# Patient Record
Sex: Female | Born: 1935 | Race: White | Hispanic: No | Smoking: Never smoker
Health system: Southern US, Community
[De-identification: ages and names within clinical notes are randomized; demographics above are authoritative.]

## PROBLEM LIST (undated history)

## (undated) DIAGNOSIS — C55 Malignant neoplasm of uterus, part unspecified: Secondary | ICD-10-CM

## (undated) DIAGNOSIS — E785 Hyperlipidemia, unspecified: Secondary | ICD-10-CM

## (undated) DIAGNOSIS — M79604 Pain in right leg: Secondary | ICD-10-CM

## (undated) DIAGNOSIS — K219 Gastro-esophageal reflux disease without esophagitis: Secondary | ICD-10-CM

## (undated) DIAGNOSIS — Z9289 Personal history of other medical treatment: Secondary | ICD-10-CM

## (undated) DIAGNOSIS — S7291XA Unspecified fracture of right femur, initial encounter for closed fracture: Secondary | ICD-10-CM

## (undated) DIAGNOSIS — F0390 Unspecified dementia without behavioral disturbance: Secondary | ICD-10-CM

## (undated) DIAGNOSIS — I480 Paroxysmal atrial fibrillation: Secondary | ICD-10-CM

## (undated) DIAGNOSIS — I890 Lymphedema, not elsewhere classified: Secondary | ICD-10-CM

## (undated) DIAGNOSIS — I10 Essential (primary) hypertension: Secondary | ICD-10-CM

## (undated) DIAGNOSIS — M1990 Unspecified osteoarthritis, unspecified site: Secondary | ICD-10-CM

## (undated) DIAGNOSIS — R251 Tremor, unspecified: Secondary | ICD-10-CM

## (undated) DIAGNOSIS — K8020 Calculus of gallbladder without cholecystitis without obstruction: Secondary | ICD-10-CM

## (undated) DIAGNOSIS — I2510 Atherosclerotic heart disease of native coronary artery without angina pectoris: Secondary | ICD-10-CM

## (undated) HISTORY — DX: Unspecified fracture of right femur, initial encounter for closed fracture: S72.91XA

## (undated) HISTORY — PX: KNEE SURGERY: SHX244

## (undated) HISTORY — DX: Gastro-esophageal reflux disease without esophagitis: K21.9

## (undated) HISTORY — DX: Calculus of gallbladder without cholecystitis without obstruction: K80.20

## (undated) HISTORY — DX: Atherosclerotic heart disease of native coronary artery without angina pectoris: I25.10

## (undated) HISTORY — PX: CATARACT EXTRACTION: SUR2

## (undated) HISTORY — DX: Pain in right leg: M79.604

## (undated) HISTORY — DX: Unspecified osteoarthritis, unspecified site: M19.90

## (undated) HISTORY — DX: Hyperlipidemia, unspecified: E78.5

## (undated) HISTORY — DX: Malignant neoplasm of uterus, part unspecified: C55

## (undated) HISTORY — PX: TOTAL HIP ARTHROPLASTY: SHX124

## (undated) HISTORY — DX: Essential (primary) hypertension: I10

## (undated) HISTORY — DX: Tremor, unspecified: R25.1

---

## 1997-05-11 HISTORY — PX: CARDIAC CATHETERIZATION: SHX172

## 1997-05-11 HISTORY — PX: OTHER SURGICAL HISTORY: SHX169

## 1997-10-01 ENCOUNTER — Inpatient Hospital Stay (HOSPITAL_COMMUNITY): Admission: AD | Admit: 1997-10-01 | Discharge: 1997-10-10 | Payer: Self-pay | Admitting: Cardiology

## 1997-11-02 ENCOUNTER — Other Ambulatory Visit: Admission: RE | Admit: 1997-11-02 | Discharge: 1997-11-02 | Payer: Self-pay | Admitting: Cardiology

## 1998-06-27 ENCOUNTER — Other Ambulatory Visit: Admission: RE | Admit: 1998-06-27 | Discharge: 1998-06-27 | Payer: Self-pay | Admitting: Obstetrics & Gynecology

## 1999-06-20 ENCOUNTER — Other Ambulatory Visit: Admission: RE | Admit: 1999-06-20 | Discharge: 1999-06-20 | Payer: Self-pay | Admitting: Obstetrics and Gynecology

## 2000-05-11 HISTORY — PX: CARDIAC CATHETERIZATION: SHX172

## 2000-08-25 ENCOUNTER — Ambulatory Visit (HOSPITAL_COMMUNITY): Admission: AD | Admit: 2000-08-25 | Discharge: 2000-08-25 | Payer: Self-pay | Admitting: Cardiology

## 2003-07-10 ENCOUNTER — Encounter: Admission: RE | Admit: 2003-07-10 | Discharge: 2003-07-10 | Payer: Self-pay | Admitting: Orthopaedic Surgery

## 2003-07-19 ENCOUNTER — Encounter: Admission: RE | Admit: 2003-07-19 | Discharge: 2003-07-19 | Payer: Self-pay | Admitting: Orthopaedic Surgery

## 2003-08-06 ENCOUNTER — Encounter: Admission: RE | Admit: 2003-08-06 | Discharge: 2003-08-06 | Payer: Self-pay | Admitting: Orthopaedic Surgery

## 2003-09-18 ENCOUNTER — Inpatient Hospital Stay (HOSPITAL_COMMUNITY): Admission: RE | Admit: 2003-09-18 | Discharge: 2003-09-21 | Payer: Self-pay | Admitting: Orthopaedic Surgery

## 2003-09-21 ENCOUNTER — Inpatient Hospital Stay (HOSPITAL_COMMUNITY)
Admission: RE | Admit: 2003-09-21 | Discharge: 2003-09-22 | Payer: Self-pay | Admitting: Physical Medicine & Rehabilitation

## 2003-09-22 ENCOUNTER — Inpatient Hospital Stay (HOSPITAL_COMMUNITY): Admission: AD | Admit: 2003-09-22 | Discharge: 2003-09-28 | Payer: Self-pay | Admitting: *Deleted

## 2003-12-26 ENCOUNTER — Encounter: Admission: RE | Admit: 2003-12-26 | Discharge: 2004-01-22 | Payer: Self-pay | Admitting: Orthopaedic Surgery

## 2004-04-09 ENCOUNTER — Ambulatory Visit (HOSPITAL_COMMUNITY): Admission: RE | Admit: 2004-04-09 | Discharge: 2004-04-09 | Payer: Self-pay | Admitting: Orthopaedic Surgery

## 2004-06-28 IMAGING — CR DG CHEST 2V
2 series · 2 of 2 positions shown · non-contrast
Comparison: none

CLINICAL DATA: Right hip osteoarthritis.  Hypertension.  Coronary artery disease.  Pre-op respiratory exam for joint replacement surgery. 
 TWO VIEW CHEST 
 There are no prior studies for comparison. 
 Mild cardiomegaly is noted.  Both lungs are clear. There is no evidence of pleural effusion.  There is no evidence of mass or adenopathy. 
 IMPRESSION
 Mild cardiomegaly.  No active disease

[view not recorded (1 of 2)]
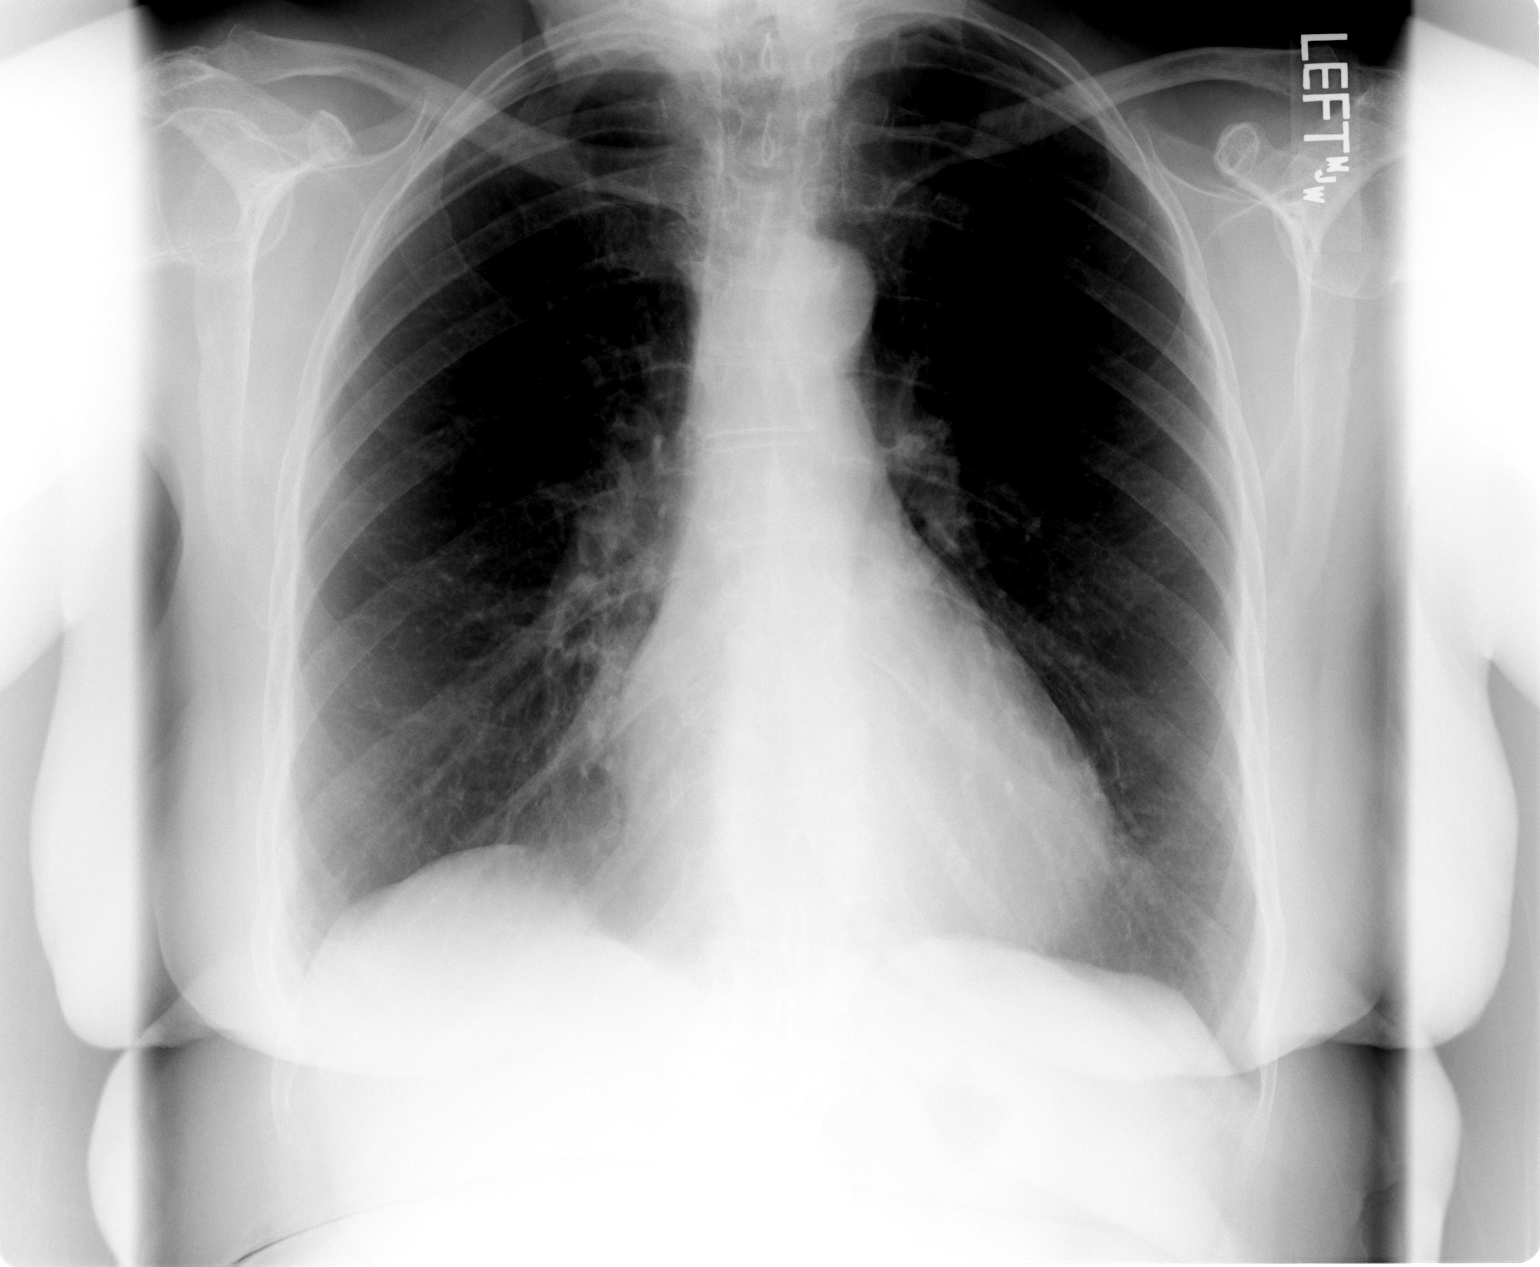

[view not recorded (2 of 2)]
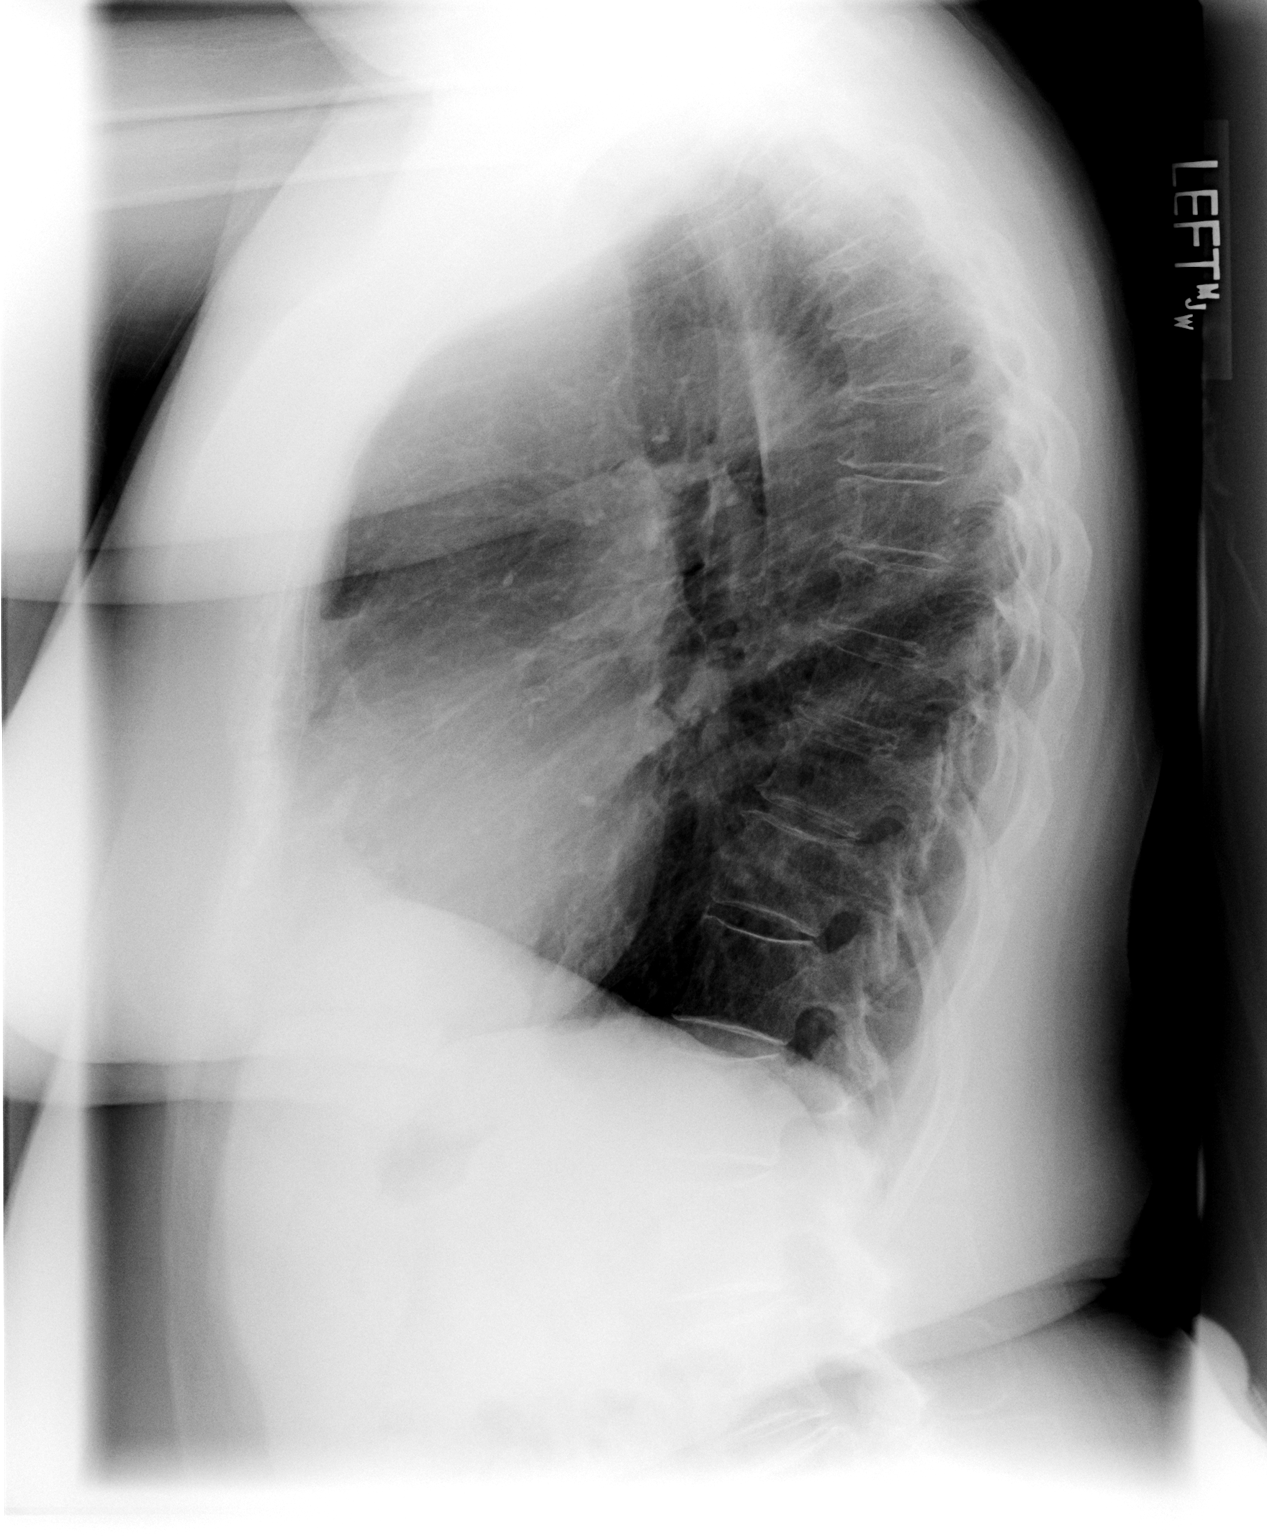

[2 of 2 positions shown; findings below may reference images not displayed]

## 2005-05-11 HISTORY — PX: CARDIAC CATHETERIZATION: SHX172

## 2005-09-24 ENCOUNTER — Encounter: Admission: RE | Admit: 2005-09-24 | Discharge: 2005-09-24 | Payer: Self-pay | Admitting: Family Medicine

## 2005-10-16 ENCOUNTER — Ambulatory Visit (HOSPITAL_COMMUNITY): Admission: RE | Admit: 2005-10-16 | Discharge: 2005-10-17 | Payer: Self-pay | Admitting: Cardiology

## 2005-10-26 ENCOUNTER — Encounter: Admission: RE | Admit: 2005-10-26 | Discharge: 2005-10-26 | Payer: Self-pay | Admitting: Cardiology

## 2006-05-27 ENCOUNTER — Emergency Department (HOSPITAL_COMMUNITY): Admission: EM | Admit: 2006-05-27 | Discharge: 2006-05-27 | Payer: Self-pay | Admitting: Emergency Medicine

## 2007-05-12 HISTORY — PX: OTHER SURGICAL HISTORY: SHX169

## 2007-11-18 ENCOUNTER — Encounter: Admission: RE | Admit: 2007-11-18 | Discharge: 2007-11-18 | Payer: Self-pay | Admitting: Family Medicine

## 2008-04-30 ENCOUNTER — Inpatient Hospital Stay (HOSPITAL_COMMUNITY): Admission: RE | Admit: 2008-04-30 | Discharge: 2008-05-07 | Payer: Self-pay | Admitting: Orthopedic Surgery

## 2008-05-23 ENCOUNTER — Encounter: Payer: Self-pay | Admitting: Family Medicine

## 2008-07-11 ENCOUNTER — Inpatient Hospital Stay (HOSPITAL_COMMUNITY): Admission: EM | Admit: 2008-07-11 | Discharge: 2008-07-19 | Payer: Self-pay

## 2008-07-11 ENCOUNTER — Encounter: Payer: Self-pay | Admitting: Emergency Medicine

## 2008-07-13 ENCOUNTER — Ambulatory Visit: Payer: Self-pay | Admitting: Vascular Surgery

## 2008-07-13 ENCOUNTER — Encounter (INDEPENDENT_AMBULATORY_CARE_PROVIDER_SITE_OTHER): Payer: Self-pay | Admitting: *Deleted

## 2008-09-07 ENCOUNTER — Ambulatory Visit: Payer: Self-pay | Admitting: Vascular Surgery

## 2008-11-04 ENCOUNTER — Inpatient Hospital Stay (HOSPITAL_COMMUNITY): Admission: EM | Admit: 2008-11-04 | Discharge: 2008-11-13 | Payer: Self-pay | Admitting: Emergency Medicine

## 2008-11-12 ENCOUNTER — Ambulatory Visit: Payer: Self-pay | Admitting: Physical Medicine & Rehabilitation

## 2008-12-19 ENCOUNTER — Encounter: Admission: RE | Admit: 2008-12-19 | Discharge: 2008-12-19 | Payer: Self-pay | Admitting: Orthopedic Surgery

## 2008-12-20 ENCOUNTER — Ambulatory Visit: Payer: Self-pay | Admitting: Family Medicine

## 2008-12-20 DIAGNOSIS — I2510 Atherosclerotic heart disease of native coronary artery without angina pectoris: Secondary | ICD-10-CM

## 2008-12-20 DIAGNOSIS — Z9861 Coronary angioplasty status: Secondary | ICD-10-CM

## 2008-12-20 DIAGNOSIS — L89309 Pressure ulcer of unspecified buttock, unspecified stage: Secondary | ICD-10-CM | POA: Insufficient documentation

## 2008-12-20 DIAGNOSIS — K219 Gastro-esophageal reflux disease without esophagitis: Secondary | ICD-10-CM

## 2008-12-20 DIAGNOSIS — R05 Cough: Secondary | ICD-10-CM | POA: Insufficient documentation

## 2008-12-20 DIAGNOSIS — J45909 Unspecified asthma, uncomplicated: Secondary | ICD-10-CM | POA: Insufficient documentation

## 2008-12-26 ENCOUNTER — Telehealth: Payer: Self-pay | Admitting: Family Medicine

## 2009-01-23 ENCOUNTER — Telehealth: Payer: Self-pay | Admitting: Family Medicine

## 2009-01-31 ENCOUNTER — Ambulatory Visit: Payer: Self-pay | Admitting: Family Medicine

## 2009-05-09 ENCOUNTER — Encounter: Payer: Self-pay | Admitting: *Deleted

## 2009-05-27 ENCOUNTER — Encounter: Payer: Self-pay | Admitting: Family Medicine

## 2009-06-18 ENCOUNTER — Ambulatory Visit: Payer: Self-pay | Admitting: Family Medicine

## 2009-06-18 DIAGNOSIS — L738 Other specified follicular disorders: Secondary | ICD-10-CM | POA: Insufficient documentation

## 2009-12-24 ENCOUNTER — Ambulatory Visit: Payer: Self-pay | Admitting: Cardiology

## 2010-04-09 ENCOUNTER — Ambulatory Visit: Payer: Self-pay | Admitting: Cardiology

## 2010-06-01 ENCOUNTER — Encounter: Payer: Self-pay | Admitting: Orthopaedic Surgery

## 2010-06-12 NOTE — Assessment & Plan Note (Signed)
Summary: RASH ON LEG/NJR   Vital Signs:  Patient profile:   77 year old female Menstrual status:  postmenopausal Temp:     98.7 degrees F BP sitting:   150 / 90  (left arm) Cuff size:   regular  Vitals Entered By: Sid Falcon LPN (June 18, 2009 11:53 AM) CC: Rash on left lower leg several months   History of Present Illness: Pruritic rash left anterior leg. Present for several weeks. No injury. No drainage. No associated warmth. No edema. Has tried topical lotions without improvement. No right leg involvement. No other skin rash  Allergies: 1)  Ferrous Sulfate (Ferrous Sulfate) 2)  Erythromycin Stearate (Erythromycin Stearate)  Past History:  Past Medical History: Last updated: 12/20/2008 Asthma GERD Fx right leg femur Arthritis Chicken pox heart arrhythmia Coronary artery disease PMH reviewed for relevance  Review of Systems      See HPI  Physical Exam  General:  Well-developed,well-nourished,in no acute distress; alert,appropriate and cooperative throughout examination Lungs:  Normal respiratory effort, chest expands symmetrically. Lungs are clear to auscultation, no crackles or wheezes. Heart:  normal rate and regular rhythm.   Skin:  left anterior leg reveals area proximally 3 x 8 cm erythematous rash slightly scaly generally atrophic skin   Impression & Recommendations:  Problem # 1:  ASTEATOTIC ECZEMA (ICD-706.8) try group 3 or 4 topical steroid  Complete Medication List: 1)  Multaq 400 Mg Tabs (Dronedarone hcl) .... Two times a day 2)  Metoprolol Tartrate 50 Mg Tabs (Metoprolol tartrate) .... Two times a day 3)  Simvastatin 20 Mg Tabs (Simvastatin) .... Once daily 4)  Aspirin 325 Mg Tabs (Aspirin) .... Once daily 5)  Isosorbide Mononitrate Cr 60 Mg Xr24h-tab (Isosorbide mononitrate) .... Once daily 6)  Zithromax Z-pak 250 Mg Tabs (Azithromycin) .... As directed 7)  Triamcinolone Acetonide 0.1 % Crea (Triamcinolone acetonide) .... Apply to  affected rash two times a day  Patient Instructions: 1)  avoid drying agents such as alcohol or peroxide 2)  Use medicated cream twice daily and be in touch 2-3 weeks if no improvement 3)  Follow up immediately if you see signs of secondary infection such as increased warmth, redness, or tenderness Prescriptions: TRIAMCINOLONE ACETONIDE 0.1 % CREA (TRIAMCINOLONE ACETONIDE) apply to affected rash two times a day  #30 gm x 1   Entered and Authorized by:   Evelena Peat MD   Signed by:   Evelena Peat MD on 06/18/2009   Method used:   Electronically to        [facility] [phone]* (retail)       [address]       [place], [place]  [address]       Ph: [phone] or [phone]       Fax: [phone]   RxID:   [id number]

## 2010-06-12 NOTE — Letter (Signed)
Summary: [facility]  [facility]   Imported By: Maryln Gottron 06/07/2009 10:01:54  _____________________________________________________________________  External Attachment:    Type:   Image     Comment:   External Document

## 2010-06-27 ENCOUNTER — Ambulatory Visit (INDEPENDENT_AMBULATORY_CARE_PROVIDER_SITE_OTHER): Payer: Medicare Other | Admitting: *Deleted

## 2010-06-27 DIAGNOSIS — G250 Essential tremor: Secondary | ICD-10-CM

## 2010-06-27 DIAGNOSIS — I4891 Unspecified atrial fibrillation: Secondary | ICD-10-CM

## 2010-06-27 DIAGNOSIS — I2510 Atherosclerotic heart disease of native coronary artery without angina pectoris: Secondary | ICD-10-CM

## 2010-07-15 ENCOUNTER — Other Ambulatory Visit: Payer: Self-pay | Admitting: Family Medicine

## 2010-07-15 DIAGNOSIS — R21 Rash and other nonspecific skin eruption: Secondary | ICD-10-CM

## 2010-08-17 LAB — PROTIME-INR
INR: 1 (ref 0.00–1.49)
INR: 1.1 (ref 0.00–1.49)
INR: 1.2 (ref 0.00–1.49)
INR: 1.2 (ref 0.00–1.49)
Prothrombin Time: 13.9 seconds (ref 11.6–15.2)
Prothrombin Time: 14.3 seconds (ref 11.6–15.2)
Prothrombin Time: 15 seconds (ref 11.6–15.2)
Prothrombin Time: 16 seconds — ABNORMAL HIGH (ref 11.6–15.2)

## 2010-08-17 LAB — BASIC METABOLIC PANEL
BUN: 11 mg/dL (ref 6–23)
BUN: 12 mg/dL (ref 6–23)
BUN: 14 mg/dL (ref 6–23)
BUN: 15 mg/dL (ref 6–23)
CO2: 26 mEq/L (ref 19–32)
Calcium: 7.8 mg/dL — ABNORMAL LOW (ref 8.4–10.5)
Calcium: 8 mg/dL — ABNORMAL LOW (ref 8.4–10.5)
Chloride: 105 mEq/L (ref 96–112)
Chloride: 110 mEq/L (ref 96–112)
Creatinine, Ser: 1.03 mg/dL (ref 0.4–1.2)
Creatinine, Ser: 1.38 mg/dL — ABNORMAL HIGH (ref 0.4–1.2)
Creatinine, Ser: 1.5 mg/dL — ABNORMAL HIGH (ref 0.4–1.2)
GFR calc Af Amer: >60 mL/min (ref >60)
GFR calc non Af Amer: 33 mL/min — ABNORMAL LOW (ref >60)
GFR calc non Af Amer: 52 mL/min — ABNORMAL LOW (ref >60)
Glucose, Bld: 154 mg/dL — ABNORMAL HIGH (ref 70–99)
Glucose, Bld: 194 mg/dL — ABNORMAL HIGH (ref 70–99)
Glucose, Bld: 97 mg/dL (ref 70–99)
Potassium: 3.7 mEq/L (ref 3.5–5.1)
Potassium: 4.1 mEq/L (ref 3.5–5.1)
Sodium: 138 mEq/L (ref 135–145)

## 2010-08-17 LAB — CBC
HCT: 26.7 % — ABNORMAL LOW (ref 36.0–46.0)
HCT: 30.3 % — ABNORMAL LOW (ref 36.0–46.0)
Hemoglobin: 9.2 g/dL — ABNORMAL LOW (ref 12.0–15.0)
MCHC: 33.7 g/dL (ref 30.0–36.0)
MCV: 88.4 fL (ref 78.0–100.0)
MCV: 90.6 fL (ref 78.0–100.0)
Platelets: 157 10*3/uL (ref 150–400)
RBC: 2.78 MIL/uL — ABNORMAL LOW (ref 3.87–5.11)
RBC: 3.03 MIL/uL — ABNORMAL LOW (ref 3.87–5.11)
RDW: 15.4 % (ref 11.5–15.5)
WBC: 11.2 10*3/uL — ABNORMAL HIGH (ref 4.0–10.5)
WBC: 6.5 10*3/uL (ref 4.0–10.5)
WBC: 6.6 10*3/uL (ref 4.0–10.5)

## 2010-08-17 LAB — CROSSMATCH
ABO/RH(D): A NEG
Antibody Screen: POSITIVE
Donor AG Type: NEGATIVE

## 2010-08-18 LAB — DIFFERENTIAL
Basophils Absolute: 0.1 10*3/uL (ref 0.0–0.1)
Eosinophils Relative: 2 % (ref 0–5)
Lymphs Abs: 1.3 10*3/uL (ref 0.7–4.0)
Neutro Abs: 4.6 10*3/uL (ref 1.7–7.7)

## 2010-08-18 LAB — POCT I-STAT 4, (NA,K, GLUC, HGB,HCT)
Glucose, Bld: 120 mg/dL — ABNORMAL HIGH (ref 70–99)
HCT: 35 % — ABNORMAL LOW (ref 36.0–46.0)
Sodium: 139 mEq/L (ref 135–145)

## 2010-08-18 LAB — CROSSMATCH: Donor AG Type: NEGATIVE

## 2010-08-18 LAB — BASIC METABOLIC PANEL
BUN: 14 mg/dL (ref 6–23)
CO2: 26 mEq/L (ref 19–32)
Calcium: 8.1 mg/dL — ABNORMAL LOW (ref 8.4–10.5)
Creatinine, Ser: 0.9 mg/dL (ref 0.4–1.2)
Glucose, Bld: 79 mg/dL (ref 70–99)

## 2010-08-18 LAB — CBC
Hemoglobin: 13.6 g/dL (ref 12.0–15.0)
MCHC: 33.3 g/dL (ref 30.0–36.0)
MCV: 90.5 fL (ref 78.0–100.0)
RBC: 4.5 MIL/uL (ref 3.87–5.11)
WBC: 6.6 10*3/uL (ref 4.0–10.5)

## 2010-08-18 LAB — URINALYSIS, ROUTINE W REFLEX MICROSCOPIC
Bilirubin Urine: NEGATIVE
Nitrite: POSITIVE — AB
Urobilinogen, UA: 0.2 mg/dL (ref 0.0–1.0)

## 2010-08-18 LAB — URINALYSIS, MICROSCOPIC ONLY
Glucose, UA: NEGATIVE mg/dL
Ketones, ur: NEGATIVE mg/dL
Protein, ur: NEGATIVE mg/dL

## 2010-08-18 LAB — URINE CULTURE: Colony Count: >=100000

## 2010-08-18 LAB — PROTIME-INR
INR: 1 (ref 0.00–1.49)
Prothrombin Time: 13.3 seconds (ref 11.6–15.2)

## 2010-08-21 LAB — CBC
HCT: 27.9 % — ABNORMAL LOW (ref 36.0–46.0)
HCT: 29.7 % — ABNORMAL LOW (ref 36.0–46.0)
HCT: 31.5 % — ABNORMAL LOW (ref 36.0–46.0)
Hemoglobin: 10.1 g/dL — ABNORMAL LOW (ref 12.0–15.0)
Hemoglobin: 9.6 g/dL — ABNORMAL LOW (ref 12.0–15.0)
Hemoglobin: 11.8 g/dL — ABNORMAL LOW (ref 12.0–15.0)
MCHC: 34.5 g/dL (ref 30.0–36.0)
MCHC: 35 g/dL (ref 30.0–36.0)
MCHC: 35.1 g/dL (ref 30.0–36.0)
MCHC: 35.2 g/dL (ref 30.0–36.0)
MCHC: 35.4 g/dL (ref 30.0–36.0)
MCHC: 36 g/dL (ref 30.0–36.0)
MCHC: 32.9 g/dL (ref 30.0–36.0)
MCV: 88.8 fL (ref 78.0–100.0)
MCV: 90.5 fL (ref 78.0–100.0)
MCV: 90.8 fL (ref 78.0–100.0)
MCV: 93.6 fL (ref 78.0–100.0)
MCV: 93 fL (ref 78.0–100.0)
Platelets: 100 10*3/uL — ABNORMAL LOW (ref 150–400)
Platelets: 136 10*3/uL — ABNORMAL LOW (ref 150–400)
RBC: 2.64 MIL/uL — ABNORMAL LOW (ref 3.87–5.11)
RBC: 2.99 MIL/uL — ABNORMAL LOW (ref 3.87–5.11)
RBC: 3.23 MIL/uL — ABNORMAL LOW (ref 3.87–5.11)
RBC: 3.48 MIL/uL — ABNORMAL LOW (ref 3.87–5.11)
RBC: 3.84 MIL/uL — ABNORMAL LOW (ref 3.87–5.11)
RDW: 14.7 % (ref 11.5–15.5)
WBC: 6.9 10*3/uL (ref 4.0–10.5)
WBC: 7 10*3/uL (ref 4.0–10.5)
WBC: 7.2 10*3/uL (ref 4.0–10.5)
WBC: 8.9 10*3/uL (ref 4.0–10.5)

## 2010-08-21 LAB — CROSSMATCH
ABO/RH(D): A NEG
DAT, IgG: NEGATIVE
Donor AG Type: NEGATIVE
Donor AG Type: NEGATIVE
Donor AG Type: NEGATIVE
Donor AG Type: NEGATIVE

## 2010-08-21 LAB — BASIC METABOLIC PANEL
BUN: 10 mg/dL (ref 6–23)
BUN: 17 mg/dL (ref 6–23)
BUN: 17 mg/dL (ref 6–23)
CO2: 21 mEq/L (ref 19–32)
CO2: 23 mEq/L (ref 19–32)
CO2: 24 mEq/L (ref 19–32)
CO2: 24 mEq/L (ref 19–32)
CO2: 27 mEq/L (ref 19–32)
CO2: 27 mEq/L (ref 19–32)
Calcium: 7.1 mg/dL — ABNORMAL LOW (ref 8.4–10.5)
Calcium: 7.4 mg/dL — ABNORMAL LOW (ref 8.4–10.5)
Calcium: 7.5 mg/dL — ABNORMAL LOW (ref 8.4–10.5)
Chloride: 108 mEq/L (ref 96–112)
Chloride: 108 mEq/L (ref 96–112)
Chloride: 108 mEq/L (ref 96–112)
Chloride: 109 mEq/L (ref 96–112)
Creatinine, Ser: 0.8 mg/dL (ref 0.4–1.2)
Creatinine, Ser: 0.99 mg/dL (ref 0.4–1.2)
Creatinine, Ser: 1.03 mg/dL (ref 0.4–1.2)
Creatinine, Ser: 1.06 mg/dL (ref 0.4–1.2)
Creatinine, Ser: 1.11 mg/dL (ref 0.4–1.2)
GFR calc Af Amer: >60 mL/min (ref >60)
GFR calc Af Amer: >60 mL/min (ref >60)
GFR calc Af Amer: >60 mL/min (ref >60)
GFR calc Af Amer: >60 mL/min (ref >60)
GFR calc non Af Amer: >60 mL/min (ref >60)
Glucose, Bld: 134 mg/dL — ABNORMAL HIGH (ref 70–99)
Glucose, Bld: 91 mg/dL (ref 70–99)
Potassium: 3.5 mEq/L (ref 3.5–5.1)
Potassium: 4.6 mEq/L (ref 3.5–5.1)
Sodium: 138 mEq/L (ref 135–145)
Sodium: 140 mEq/L (ref 135–145)

## 2010-08-21 LAB — URINE CULTURE
Colony Count: NO GROWTH
Culture: NO GROWTH

## 2010-08-21 LAB — PHOSPHORUS: Phosphorus: 6 mg/dL — ABNORMAL HIGH (ref 2.3–4.6)

## 2010-08-21 LAB — DIFFERENTIAL
Basophils Relative: 1 % (ref 0–1)
Eosinophils Absolute: 0.2 10*3/uL (ref 0.0–0.7)
Lymphs Abs: 1.4 10*3/uL (ref 0.7–4.0)
Monocytes Absolute: 0.7 10*3/uL (ref 0.1–1.0)
Monocytes Relative: 8 % (ref 3–12)
Neutro Abs: 6.6 10*3/uL (ref 1.7–7.7)

## 2010-08-21 LAB — POCT I-STAT, CHEM 8
Chloride: 107 mEq/L (ref 96–112)
Glucose, Bld: 142 mg/dL — ABNORMAL HIGH (ref 70–99)
HCT: 36 % (ref 36.0–46.0)
Hemoglobin: 12.2 g/dL (ref 12.0–15.0)
Potassium: 4.1 mEq/L (ref 3.5–5.1)

## 2010-08-21 LAB — POCT CARDIAC MARKERS
CKMB, poc: <1 ng/mL — ABNORMAL LOW (ref 1.0–8.0)
Troponin i, poc: <0.05 ng/mL (ref 0.00–0.09)

## 2010-08-21 LAB — URINALYSIS, ROUTINE W REFLEX MICROSCOPIC
Glucose, UA: NEGATIVE mg/dL
Ketones, ur: NEGATIVE mg/dL
Protein, ur: 30 mg/dL — AB
Urobilinogen, UA: 0.2 mg/dL (ref 0.0–1.0)

## 2010-08-21 LAB — CARDIAC PANEL(CRET KIN+CKTOT+MB+TROPI)
CK, MB: 1.2 ng/mL (ref 0.3–4.0)
Relative Index: 1 (ref 0.0–2.5)
Troponin I: <0.01 ng/mL (ref 0.00–0.06)
Troponin I: <0.01 ng/mL (ref 0.00–0.06)

## 2010-08-21 LAB — POCT I-STAT 4, (NA,K, GLUC, HGB,HCT)
Potassium: 4.8 mEq/L (ref 3.5–5.1)
Sodium: 139 mEq/L (ref 135–145)

## 2010-08-21 LAB — COMPREHENSIVE METABOLIC PANEL
ALT: 10 U/L (ref 0–35)
AST: 17 U/L (ref 0–37)
Albumin: 2.7 g/dL — ABNORMAL LOW (ref 3.5–5.2)
Alkaline Phosphatase: 56 U/L (ref 39–117)
BUN: 25 mg/dL — ABNORMAL HIGH (ref 6–23)
Chloride: 106 mEq/L (ref 96–112)
Potassium: 4.4 mEq/L (ref 3.5–5.1)
Total Bilirubin: 0.6 mg/dL (ref 0.3–1.2)

## 2010-08-21 LAB — CK TOTAL AND CKMB (NOT AT ARMC): CK, MB: 1.3 ng/mL (ref 0.3–4.0)

## 2010-08-21 LAB — TROPONIN I: Troponin I: 0.01 ng/mL (ref 0.00–0.06)

## 2010-08-21 LAB — URINE MICROSCOPIC-ADD ON

## 2010-08-21 LAB — APTT: aPTT: 28 seconds (ref 24–37)

## 2010-09-23 NOTE — Op Note (Signed)
NAME:  Dominique Perez, Dominique Perez                 ACCOUNT NO.:  [id number]   MEDICAL RECORD NO.:  [id number]          PATIENT TYPE:  INP   LOCATION:  1611                         FACILITY:  [facility]   PHYSICIAN:  Ollen Gross, M.D.    DATE OF BIRTH:  Sep 12, 1926   DATE OF PROCEDURE:  11/07/2008  DATE OF DISCHARGE:                               OPERATIVE REPORT   PREOPERATIVE DIAGNOSIS:  Right periprosthetic femur fracture with  nonunion.   POSTOPERATIVE DIAGNOSIS:  Right periprosthetic femur fracture with  nonunion.   PROCEDURE:  Open reduction internal fixation, right periprosthetic femur  fracture with hardware removal and subsequent placement of  intramedullary nail and femoral strut grafting.   SURGEON:  Ollen Gross, M.D.   ASSISTANT:  Dominique Peace PA-C   ANESTHESIA:  General.   ESTIMATED BLOOD LOSS:  700 mL.   DRAINS:  Hemovac x2.   COMPLICATIONS:  None.   CONDITION:  Stable to recovery.   CLINICAL NOTE:  Dominique Perez is a 77 year old female who had a right  periprosthetic femur fracture fixed by Dr. Charlann Boxer back in March.  She  fractured between the total knee and the total hip.  They used a locking  plate.  She did well initially and this past week was walking and felt a  snap.  X-rays in the hospital showed that the plate was broken and that  she refractured.  She has been cleared medically and presents now for  open reduction internal fixation.   PROCEDURE IN DETAIL:  After successful administration of general  anesthetic, the patient was placed in supine position on the operating  table.  Right lower extremity is isolated from her perineum with plastic  drapes and prepped and draped in the usual sterile fashion.  I flexed  her hip and bumped her hip up to slightly internally rotate the femur.  I used a previous lateral incision and cut the skin with a 10 blade  through subcutaneous tissue to the fascia lata which was incised in line  with the skin incision.  The scar from the  fascia and the vastus  lateralis was incised and then I incised through scar to get down to the  plate.  The plate was broken at the fracture site.  This was a Synthes  locking plate.  All the screws were removed from the plate.  There were  3 cables and those were removed also.  All the hardware was taken out of  the femur.  There was gross comminution at the fracture site.  I pulled  the femur out to length and held it in reduction clamps.  There was some  bone loss cortical anteriorly.  I was satisfied with the restoration of  length and with the alignment.  We then opened the knee with a midline  incision.  Skin was cut with a fresh 10 blade through subcutaneous  tissue and a fresh blade is used make a medial arthrotomy.  Patella was  subluxed laterally and the intercondylar notch is visualized.  This was  fortunately a open box total knee arthroplasty.  Thus  we were able to  pass a retrograde nail.  The drill was used to place the guide pin  through the intercondylar notch up into the femoral canal and visualized  the pin go into the proximal fragment.  We removed this, then placed a  measurement guide and 20 cm is the best length for this retrograde nail.  It is a 10 mm diameter.  The nail was hooked up to the alignment guide  and then I did my proximal drilling over the guide pin.  The guide pins  removed and the nail was placed through the intercondylar notch of the  femur up across the fracture site and into the proximal portion.  It  stopped about 4 cm below the tip of the femoral stem.  We placed 3  screws through the external guide distally into the femoral condyles,  each with excellent purchase.  I then put the femur back out to the  length, reduced the fracture and placed the lateral to medial interlock  through the external guide of the rod and then shifted the guide  anteriorly and put it anterior to posterior interlock each with  excellent purchase.  Fluoroscopic views,  AP and lateral showed excellent  reduction of this fracture and also the hardware was in good position.   Some of the loose fragments of bone were morselized and placed into the  fracture site.  The large cortical piece set back in its appropriate  position.  I then took a femoral strut graft, cut it down to size and  formulated the shape so that would fit on the lateral cortex of the  femur.  Four titanium Zimmer cables passed around the femur.  We then  incorporated the strut graft with the cables and sequentially tightened  all the cables using the Zimmer system.  Once the cables were adequately  tight, the clamps are tightened and the cable was cut.  This looked a  very stable reduction and again restoration of length and alignment of  this comminuted fracture.  I did not place any of the bone graft into  the fracture site as I did not want to place allograft into this bed  that has been operated on several times for fear of infection.  We  thoroughly irrigated both the knee wound and the lateral femur wound  with liters of saline solution.  The fascia lata and fascia of the  vastus lateralis were both closed with running #1 Vicryl over a Hemovac  drain.  Subcu was closed with #1 and interrupted 2-0 Vicryl.  The  lateral incision in the skin was closed with staples.  A Hemovac was  also placed in the knee and the arthrotomy closed with interrupted #1  PDS.  Subcu closed with interrupted 2-0 Vicryl, skin with staples.  Prior to closing, we did take multiple fluoroscopic views which showed  excellent alignment of fracture and position of hardware.  At the  conclusion the Hemovacs were hooked up to suction and bulky sterile  dressings were applied.  She is placed into a knee immobilizer, awakened  and transferred to recovery in stable condition.      Ollen Gross, M.D.  Electronically Signed     FA/MEDQ  D:  11/07/2008  T:  11/08/2008  Job:  [id number]

## 2010-09-23 NOTE — H&P (Signed)
NAME:  Dominique Perez, Dominique Perez NO.:  [id number]   MEDICAL RECORD NO.:  [id number]          PATIENT TYPE:  EMS   LOCATION:  ED                           [facility]AC[facility]  [facility]   PHYSICIAN:  Lucita Ferrara, MD         DATE OF BIRTH:  Feb 21, 1927   DATE OF ADMISSION:  07/11/2008  DATE OF DISCHARGE:                              HISTORY & PHYSICAL   ADDENDUM:  It was noted on her EKG that patient is relatively  bradycardic with a heart rate of 54.  Patient's heart rate usually runs  in the 70's.  It was also determined that patient has recently started a  medication called Multaq, which I believe is a medication that is  similar to amiodarone.  Patient has stopped her amiodarone.  In this  regard, cardiology should be definitely consulted.  We will go ahead and  continue her on Multaq.  We will put parameters on her metoprolol and  her Diltiazem and split them into 4 doses per day and 2 doses per day  respectively.  A 2D echocardiogram, cardiac enzymes x3 q.8h.  Repeat  EKG.  Watch her heart rate.  Watch her blood pressure.  The rest of the  plans are dependent upon her progress.      Lucita Ferrara, MD  Electronically Signed     RR/MEDQ  D:  07/11/2008  T:  07/11/2008  Job:  [id number]

## 2010-09-23 NOTE — Consult Note (Signed)
NAME:  Dominique, Perez                 ACCOUNT NO.:  [id number]   MEDICAL RECORD NO.:  [id number]          PATIENT TYPE:  INP   LOCATION:  3740                         FACILITY:  [facility]   PHYSICIAN:  Colleen Can. Deborah Chalk, M.D.DATE OF BIRTH:  Aug 30, 1926   DATE OF CONSULTATION:  07/12/2008  DATE OF DISCHARGE:                                 CONSULTATION   HISTORY OF PRESENT ILLNESS:  We have been asked by the [facility]  Hospitalist and orthopedic service to see [name].  She is a 77-year-  old female who was cleaning snow off her windshield of the car.  She  turned and was unsteady.  She is not sure whether or not it was related  to dizziness, but she was very clear on the fact that she did not lose  consciousness.  She landed on her right thigh and immediately knew she  had injured her leg.  She did not lose consciousness throughout all  this.  She had been on amiodarone because of history of paroxysmal  atrial fibrillation.  She developed a tremor.  Because of that tremor,  amiodarone was stopped 6 days ago.  Four days ago, she was started on  Multaq 400 mg twice a day.  She has tolerated that without arrhythmia  and has had followup EKGs without QT prolongation.  She has not been  having any other symptoms from a cardiac standpoint.  She has not had  any chest pain or lightheadedness or dizziness.  She has had no  palpitations.  The tremor that she was having has been essentially  unchanged over the past week.  In the office on July 10, 2008, she had a  sinus bradycardia with a rate of 51 with a QT of 490 milliseconds and  corrected QT of 451 milliseconds. We are asked to provide cardiac  clearance.   She has known ischemic heart disease.  She had stents to her diagonal  vessel in [place] 2007 and a stent to the right coronary artery in May  1999.  She had her last stress Cardiolite study in March 2009, which  shows an ejection fraction of 76% with no evidence of ischemia.   When she was  hospitalized with a left total knee replacement in December  2009, she had a paroxysmal atrial fibrillation.   ALLERGIES:  ERYTHROMYCIN and SULFA.   OTHER MEDICAL PROBLEMS:  1. History of PAF.  2. Hypertension.  3. Hyperlipidemia.  4. Poor condition.  5. Chronic low back pain.  6. Previous femoral pseudoaneurysm in May 1999.   CURRENT MEDICATIONS:  1. Metoprolol 25 mg b.i.d.  2. Imdur 60 mg b.i.d.  3. Diltiazem CD 120 mg per day.  4. Simvastatin 20 mg per day.  5. Prevacid 30 mg daily.  6. Initially Plavix 75 mg per day.  7. Aspirin 325 mg a day.  8. Multaq 400 mg b.i.d.  9. Multivitamin.   FAMILY HISTORY:  Father died with lung cancer and mother died with  myocardial infarction.   SOCIAL HISTORY:  She is widowed.  She has 4 daughters.  No  tobacco and  no alcohol.  She lives alone. Recently completed rehab following knee  replacement.   REVIEW OF SYSTEMS:  She has some history of dysuria.  She has done  reasonably well with recovery from her left total knee in December 2009.  She has had a significant tremor and is contributed to the amiodarone  and led to discontinuation of that.  She has osteoarthritis.  She had  significant nausea, vomiting, and diarrhea and this was felt to be  secondary amiodarone which has improved.  She recently had a metoprolol  dose cut back because of bradycardia.   And all other review of systems are negative.   PHYSICAL EXAMINATION:  GENERAL:  She is an elderly white female. She is  pleasant and able to follow commands.  VITAL SIGNS:  Blood pressure is 140/80 and heart rate 60. Afebrile.  SKIN: Warm and dry. COLOR: Normal.  HEENT:  Normocephalic and atraumatic.  Her pupils were equal, round, and  reactive to light.  Sclerae are nonicteric.  Oropharynx is moist.  NECK:  Supple.  No lymphadenopathy. No JVD.  HEART:  She has a regular rate and rhythm. No murmur.  LUNGS:  Clear.  There is no jugular venous distention.  ABDOMEN:  Soft and  nontender. + bowel sounds.  EXTREMITIES:  The left knee is not swollen and has healed surgical  wounds.  The right leg is in a brace. Distal pulses are intact. She does  have resting tremor of the hands.  M/S:  ROM intact except for R leg. Gait not able to be assessed.  NEURO:  Intact with no gross focal deficits.   IMPRESSION:  1. A fall leading to fracture of the right femur.  2. History of paroxysmal atrial fibrillation with recent initiation of      Multaq after discontinuation of amiodarone secondary to tremor,      nausea and vomiting.  3. History of coronary artery disease with stents in 2007 and also a      stent in the right coronary artery in 1999.  4. Osteoarthritis.  5. HTN.  6. Hyperlipidemia.   PLAN:  I think it is okay for her to undergo surgical repair of her  right femur.  I would like to watch her on telemetry both before and  after her surgery to make sure that we do not have any arrhythmia that  could be attributed to her fall, although she is clear in her history  that she did not have loss of consciousness.  She has not had any  recent angina or exacerbation of her ischemic heart disease and I think  she would be at low risk for surgical repair.  She does have a normal  ejection fraction with study performed in March 2009. We will follow  along with you. Thank you for the opportunity to see [name].      Colleen Can. Deborah Chalk, M.D.  Electronically Signed     SNT/MEDQ  D:  07/12/2008  T:  07/13/2008  Job:  [id number]   cc:   Madlyn Frankel Charlann Boxer, M.D.

## 2010-09-23 NOTE — H&P (Signed)
NAME:  Dominique Perez, Dominique Perez NO.:  [id number]   MEDICAL RECORD NO.:  [id number]          PATIENT TYPE:  INP   LOCATION:  0006                         FACILITY:  [facility]   PHYSICIAN:  Ollen Gross, M.D.    DATE OF BIRTH:  1926/09/08   DATE OF ADMISSION:  04/30/2008  DATE OF DISCHARGE:                              HISTORY & PHYSICAL   CHIEF COMPLAINT:  Left knee pain.   HISTORY OF PRESENT ILLNESS:  The patient is a 77 year old female who has  been seen by Dr. Lequita Halt for ongoing left knee pain.  She has known end-  stage arthritis, progressively getting worse.  It is felt she would  benefit from undergoing total knee replacement.  Risks and benefits have  been discussed.  She elects to proceed with surgery.   ALLERGIES:  Sulfa drugs, erythromycin, and x-ray dye, which is the older  type contrast.   CURRENT MEDICATIONS:  Metoprolol, Avalide, isosorbide, diltiazem CD,  Prevacid, Plavix, simvastatin, enteric-coated aspirin, Estrace cream,  Centrum Silver multivitamins.   PAST MEDICAL HISTORY:  Childhood asthma, history of bronchitis, history  of pneumonia, hypertension, coronary arterial disease, atrial  fibrillation, reflux disease, arthritis.   PAST SURGICAL HISTORY:  She has undergone cardiac catheterization with  angioplasty in 1999 with two stentings. Again a cardiac catheterization  in [place] 2007 with one stent. Also right knee replacement and right hip  replacement.   FAMILY HISTORY:  Father with lung cancer.  Mother with heart attack.   SOCIAL HISTORY:  Widowed, retired, nonsmoker.  No alcohol.  Four  children.  Lives alone. She does have a son-in-law who works with  [employer] and wants to use their service.   REVIEW OF SYSTEMS:  GENERAL:  No fevers, chills, night sweats.  NEUROLOGICAL:  No seizures, syncope, or paralysis.  RESPIRATORY:  Shortness breath, productive cough or hemoptysis.  CARDIOVASCULAR:  No  chest pain, no orthopnea.  GI: No nausea,  vomiting, diarrhea, or  constipation.  GU: No dysuria, hematuria, or discharge.  MUSCULOSKELETAL:  Joint pain.   PHYSICAL EXAMINATION:  VITAL SIGNS: Pulse 68, respirations 12, blood  pressure 109/60.  GENERAL:  A 77 year old white female well-nourished, well-developed, in  no acute distress, mildly anxious.  HEENT: Normocephalic, atraumatic.  Pupils are equal, round, and reactive.  Oropharynx clear.  EOMs intact.  NECK:  Supple.  No bruits.  CHEST: Clear.  HEART: Regular rate and rhythm without murmur. S1, S2 noted.  ABDOMEN: Soft, slightly round.  Bowel sounds present.  Rectal, breast and genitalia not done, not pertinent to present illness.  EXTREMITIES:  Left knee significant varus malalignment deformity more  medial than lateral.  Range of motion 10-110, marked crepitus noted.   IMPRESSION:  Osteoarthritis left knee.   PLAN:  The patient was admitted to [facility] to undergo a  left total knee replacement arthroplasty.  Surgery will be performed by  Dr. Ollen Gross. Per cardiology they recommend that she be on a  monitored bed for 48 hours. Dr. Deborah Chalk put in his note her estimated  ejection fraction in March 2009  was estimated at 76%.  She had a  negative stress nuclear test. Will consult cardiology postoperatively.      Alexzandrew L. Perkins, P.A.C.      Ollen Gross, M.D.  Electronically Signed    ALP/MEDQ  D:  04/29/2008  T:  04/30/2008  Job:  [id number]   cc:   Ollen Gross, M.D.  Fax: [phone]   Evelena Peat, M.D.   Colleen Can. Deborah Chalk, M.D.  Fax: [phone]

## 2010-09-23 NOTE — Discharge Summary (Signed)
NAME:  Dominique Perez, Dominique Perez NO.:  [id number]   MEDICAL RECORD NO.:  [id number]          PATIENT TYPE:  INP   LOCATION:  3740                         FACILITY:  [facility]   PHYSICIAN:  Isidor Holts, M.D.  DATE OF BIRTH:  Mar 12, 1927   DATE OF ADMISSION:  07/11/2008  DATE OF DISCHARGE:  07/19/2008                               DISCHARGE SUMMARY   ADDENDUM:  For discharge diagnoses, admission history, detailed clinical  course, consultations and procedures, refer to interim discharge summary  dictated on July 17, 2008, by Dr. Eduard Clos.  However, for the  period from July 18, 2008, to July 19, 2008, the following are  pertinent:  The patient developed increased heart rate on July 19, 2008, necessitating increasing her beta-blocker dosage, with  satisfactory clinical response.  Although she did have transient  diarrhea on July 17, 2008, this quickly resolved following  discontinuation of MiraLax.  She has had no diarrhea since.  aAs of a.m.  of July 19, 2008, there were no new issues, heart rate was controlled  at 65 per minute, BP was normal at 122/64 mmHg.  Physical examination  revealed clear lung fields.  Cardiac monitor showed the patient to be in  sinus rhythm.  Lower extremity examination revealed some postoperative  edema, but otherwise no concerning findings.   DISPOSITION:  The patient was considered clinically stable for discharge  on July 19, 2008.  She was also cleared by cardiologist and orthopedic  surgeon for same.  The patient was therefore discharged accordingly to  SNF, for rehab.   DISCHARGE MEDICATIONS:  1. Lopressor 50 mg p.o. q.i.d. (the patient was on 50 mg p.o. t.i.d.).  2. Imdur 60 mg p.o. daily.  3. Aspirin 81 mg p.o. daily (was on 325 mg p.o. daily).  4. Zocor 20 mg p.o. q.h.s.  5. Estrace cream 0.01% topically b.i.d. per vagina.  6. Multaq 400 mg p.o. b.i.d.  7. Centrum Silver one p.o. daily.  8. Prevacid 30 mg p.o. daily.  9. Cardizem CD 120 mg p.o. daily.  10.Ferrous sulfate 325 mg p.o. t.i.d.  11.Lovenox 80 mg subcutaneously q.12 hourly, to be continued until INR      is therapeutic at between 2-3 for 48 hours, then discontinue.  12.Coumadin per INR, currently on 5 mg p.o. at 6 p.m. daily.  13.Roxicodone 5 mg p.o. p.r.n. q.4 hourly.  14.Senokot S one p.o. q.h.s.   Note:  Plavix has been discontinued.   DIET:  Heart-healthy.   ACTIVITY:  Per PT/OT/rehab.   FOLLOWUP INSTRUCTIONS:  The patient is to follow up with nursing  facility MD.  In addition, she is to follow up with her primary  cardiologist, Dr. Roger Shelter, on a date to be scheduled.  Nursing  facility should please contact Dr. Roger Shelter to schedule followup  appointment.      Isidor Holts, M.D.  Electronically Signed     CO/MEDQ  D:  07/19/2008  T:  07/19/2008  Job:  [id number]   cc:   Colleen Can. Deborah Chalk, M.D.  Madlyn Frankel Charlann Boxer, M.D.

## 2010-09-23 NOTE — Discharge Summary (Signed)
NAME:  Dominique Perez, Dominique Perez NO.:  [id number]   MEDICAL RECORD NO.:  [id number]          PATIENT TYPE:  INP   LOCATION:  3740                         FACILITY:  [facility]   PHYSICIAN:  Eduard Clos, MDDATE OF BIRTH:  1927-05-11   DATE OF ADMISSION:  07/11/2008  DATE OF DISCHARGE:  07/17/2008                               DISCHARGE SUMMARY   COURSE IN THE HOSPITAL:  A 77 year old female who had a fall and had a  right thigh injury, was brought in here at [facility], had an x-ray  which showed comminuted, mildly displaced, mid-to-distal right femoral  fracture.  There is a large joint effusion.  Patient also had a UA which  was compatible with UTI.  Was admitted initially at [facility] and  transferred to [facility] for further management of her fracture.  Orthopedics was consulted.   As patient had atrial fibrillation and the previous stenting, cardiology  was consulted.  Patient's cardiologist was Dr. Deborah Chalk.  As per Dr.  Deborah Chalk, patient was initially started on Multaq but eventually  restarted on IV Cardizem because patient had atrial fibrillation with  rapid ventricular rate and eventually was cleared for surgery.  Patient  underwent right ORIF.  Patient also had acute blood loss anemia after  surgery.  Patient eventually totally received 4 units of PRBC.  At this  time, hemoglobin is currently stable.  Patient is started on full-dose  Lovenox for A-Fib and Coumadin has been started now.  At this time,  orthopedics has okayed patient to be transferred to skilled nursing  facility.  Patient has developed diarrhea for which we will check stool  for C. diff and also add Crestor and Florastor.  If patient's diarrhea  resolves and if hemoglobin is stable, along with the vitals being  stable, patient can be. Transferred to skilled nursing facility.   PROCEDURES DONE DURING THIS STAY:  1. Right ORIF.  2. X-ray of the right femur on July 11, 2008, shows comminuted,  mid-to-      distal, right femoral fracture.  3. CT of the head without contrast on July 12, 2008, shows age-      indeterminate small vessel ischemic change in the right frontal      white matter and left pons, otherwise no acute intraocular      abnormality.  4. Right femur x-ray on July 13, 2008, shows status post ORIF, distal      right femoral.   FINAL DIAGNOSES:  1. Status post right open reduction and internal fixation.  2. Atrial fibrillation, now sinus rhythm, rate controlled.  3. Anemia from acute blood loss, stable.  4. Acute renal failure, improved, resolved.  5. Urinary tract infection.  6. Diarrhea.  7. History of coronary artery disease status post stenting.   PLAN:  At this time, we will get a stool for C. diff.  If patient's  diarrhea has resolved and hemoglobin stable and vitals are stable,  patient will be transferred to skilled nursing facility.  Patient  presently is on full dose Lovenox and Coumadin.   DISCHARGING  MEDICATIONS:  Will be dictated at the time of discharge by  the discharging M.D.      Eduard Clos, MD  Electronically Signed     ANK/MEDQ  D:  07/17/2008  T:  07/17/2008  Job:  [id number]

## 2010-09-23 NOTE — H&P (Signed)
NAME:  Dominique Perez, SKELTON                 ACCOUNT NO.:  [id number]   MEDICAL RECORD NO.:  [id number]          PATIENT TYPE:  INP   LOCATION:  1611                         FACILITY:  [facility]   PHYSICIAN:  Alvy Beal, MD    DATE OF BIRTH:  1927-05-02   DATE OF ADMISSION:  11/04/2008  DATE OF DISCHARGE:                              HISTORY & PHYSICAL   REASON FOR ADMISSION:  Right femur fracture.   HISTORY:  This is a very pleasant active 77 year old  woman who has had  a total hip and total knee done on the right-hand side.  She was  recently treated for right periprosthetic femur fracture by my partner,  Dr. Charlann Boxer.  She last saw him in follow-up on Wednesday, and per the  patient x-rays were done.  Everything was fine, and Dr. Charlann Boxer indicates  she was healing well.  This afternoon she started noticing difficulty  and significant pain with ambulation.  She cannot recall any trauma,  injury, fall or event to account for.  As a result of the increasing  pain, she called the office and it was recommended that she come to the  ER for further evaluation.  In the emergency room x-rays were done which  demonstrated hardware failure and a recurrent distal third __________  femur fracture.  As a result I was notified.   PAST MEDICAL, SURGICAL, FAMILY, SOCIAL HISTORY:  1. Coronary artery disease.  2. Hypertension.  3. Arthritis.  4. Reflux.  5. Hyperlipidemia.   No significant family history.  She has had a knee replacement, hip  replacement, and ORIF of the femur.  She is a nonsmoker, non-drug  abuser.  No recent air travel or any significant trauma or injury.   She has no known drug allergies.   MEDICATIONS:  Isosorbide mononitrate, metoprolol, simvastatin, aspirin,  Tylenol, multivitamin.   PHYSICAL EXAMINATION:  She is a pleasant woman who appears her stated  age in no acute distress.  She is alert and oriented x3.  Cranial nerves II-XII were tested.  They are intact.  No shortness of  breath or chest pain.  ABDOMEN:  Soft and nontender.  No history of incontinence of bowel and bladder.  She has no significant pain with hip, knee or ankle.  Range of motion is  gentle.  She does have pain with axial load applied to the right leg.  Distal neurological exam is intact with 5/5 EHL, tibialis anterior,  gastrocnemius function negative. Babinski test:  No clonus.  Symmetrical  deep tendon reflexes throughout, 2+ dorsalis pedis, posterior tibialis  pulses.  No pain with passive stretch of the EHL or of the toes, and  calves are soft and nontender.   X-rays do confirm fracture of the lateral femoral plate with a distal  third oblique fracture.   At this point in time, the patient is comfortable.  Will keep her in a  knee immobilizer, admit her, and I will discuss the case with Dr. Charlann Boxer,  her original treating orthopedic surgeon.  I will keep her n.p.o. except  for her medications. Will  also add Lovenox for DVT prophylaxis.  Will  keep her n.p.o. pending Dr. Nilsa Nutting review.      Alvy Beal, MD  Electronically Signed     DDB/MEDQ  D:  11/04/2008  T:  11/05/2008  Job:  [id number]   cc:   [facility]

## 2010-09-23 NOTE — Consult Note (Signed)
NAME:  Dominique Perez, Dominique Perez                 ACCOUNT NO.:  [id number]   MEDICAL RECORD NO.:  [id number]          PATIENT TYPE:  INP   LOCATION:  1430                         FACILITY:  [facility]   PHYSICIAN:  Colleen Can. Deborah Chalk, M.D.DATE OF BIRTH:  25-Jun-1926   DATE OF CONSULTATION:  04/30/2008  DATE OF DISCHARGE:                                 CONSULTATION   Thank you very much for asking [place] to see Dominique Perez for evaluation of  postoperative cardiac issues.  We were asked by Dr. Despina Hick to see Dominique Perez.   Currently, she is doing well with no cardiac complaints.  She does not  have any dyspnea or other current issues.  The afternoon after her  surgery, she still has satisfactory anesthesia, and her knee is moving  well.  She is not dyspneic.  She is having no chest pain and overall  doing well.   PAST MEDICAL HISTORY:  She does have a history of prior stents to the  right coronary artery in 1999 and to the diagonal vessel in 2000.  Her  last Cardiolite study was in March, 2009, which was negative for  ischemia.  She had a normal ejection fraction.  She does have a history  of paroxysmal atrial fibrillation, hypertension, hyperlipidemia, poor  conditioning, chronic low back pain, and a previous femoral  pseudoaneurysm in May, 1999.   ALLERGIES:  ERYTHROMYCIN, SULFA.   MEDICATIONS:  1. Lopressor 50 mg t.i.d.  2. Prevacid 30 mg daily.  3. Aspirin.  4. Multivitamin.  5. Avalide 150/12.5 daily.  6. Imdur 30 mg a day.  7. Plavix 75 mg a day.  8. Zocor 20 mg a day.  9. Diltiazem 60 mg p.r.n.   FAMILY HISTORY:  Family history is associated with father dying with  lung cancer and mother dying with myocardial infarction.   SURGICAL HISTORY:  She is widowed.  She has 4 daughters.  No tobacco, no  alcohol.   REVIEW OF SYSTEMS:  All other review of systems are negative.   PHYSICAL EXAMINATION:  She is in no acute distress.  She is pleasant.  Blood pressure 148/90, heart rate 70,  respiratory rate 20.  SKIN:  Warm and dry.  Color is normal.  Normocephalic and atraumatic.  Sclerae are anicteric.  Conjunctivae are  clear.  Oropharynx is unremarkable.  NECK:  Supple.  No jugular venous distention.  LUNGS:  Clear.  HEART:  Regular rate and rhythm.  ABDOMEN:  Soft and nontender without mass.  EXTREMITIES:  Without edema.  The CPM is on the left leg.   LABS:  None were done today.   OVERALL IMPRESSION:  1. Status post left total knee.  2. History of coronary disease.  3. History of paroxysmal atrial fibrillation, currently at sinus      rhythm.   PLAN:  We will continue home meds.  We will monitor on telemetry.  We  will follow labs with you.  Overall, she appears to be doing well  immediately postop.      Colleen Can. Deborah Chalk, M.D.  Electronically Signed  SNT/MEDQ  D:  05/01/2008  T:  05/01/2008  Job:  [id number]

## 2010-09-23 NOTE — Op Note (Signed)
NAME:  Dominique Perez, Dominique Perez                 ACCOUNT NO.:  [id number]   MEDICAL RECORD NO.:  [id number]          PATIENT TYPE:  INP   LOCATION:  3740                         FACILITY:  [facility]   PHYSICIAN:  Madlyn Frankel. Charlann Boxer, M.D.  DATE OF BIRTH:  04/16/1927   DATE OF PROCEDURE:  07/13/2008  DATE OF DISCHARGE:                               OPERATIVE REPORT   PREOPERATIVE DIAGNOSIS:  Closed comminuted right periprosthetic femur  fracture proximal to a knee replacement distal to the right hip  replacement.   POSTOPERATIVE DIAGNOSIS:  Closed comminuted right periprosthetic femur  fracture proximal to a knee replacement distal to the right hip  replacement.   PROCEDURE:  Open reduction and internal fixation of a right  periprosthetic femur fracture utilizing [employer]ynthes 12-hole distal femoral  supracondylar locking plate utilizing 4 cables in the comminuted segment  as well as a single 16-gauge wire.   SURGEON:  Madlyn Frankel. Charlann Boxer, MD   ASSISTANT:  Yetta Glassman. Mann, PA   ANESTHESIA:  General.   ESTIMATED BLOOD LOSS:  800 mL.   DRAINAGE:  None.   COMPLICATIONS:  None.   INDICATIONS FOR THE PROCEDURE:  Ms. Almendariz is a 77 year old female with  a history of right total hip replacement and right total knee  replacement in addition to a left total knee replacement.  She  unfortunately fell at home having obvious deformity and inability to  bear weight.  She was brought to the emergency room where radiographs  revealed the above fractures.  She was initially transferred from [facility] to [facility].  Due to medical issues, she is seen and evaluated  and cleared from medical standpoint.  She noted to have gone into atrial  fibrillation and treated with diltiazem drip and cleared for surgical  intervention.  Had a chance to review the fracture pattern, risks, and  benefits of surgery, pros and cons, and the necessity in this situation.  Postoperative course reviewed and discussed exam.   PROCEDURE IN DETAIL:  The patient was brought to the operative theater.  Once adequate anesthesia, preoperative antibiotics, Ancef administered,  the patient was positioned supine on a flat Jackson fracture table.   The perineum was then prepped out in a standard fashion then the lower  extremity prepped and draped in a sterile fashion.   Time-out was performed identifying the patient, planned procedure, and  extremity.  The lateral incision was extended from the distal femoral  condylar area proximal probably two-thirds of the width of femur.  I  dissected down to the iliotibial band and then split it and opened in a  single fascial and preserving the vastus lateralis musculature.  I then  elevated the vastus lateralis anteriorly over the fractures identifying  the fracture.  There was significant destruction to the muscle in this  area due to the comminuted spiral natures on the fracture fragment.  Once the fracture was identified, probably it took me about 45 minutes  to an hour to get the fracture actually reduced into an anatomic  position due to the deforming forces including rotational forces and  the  fragmentation, comminution of the fracture.  Once I was able to get the  fracture reduced with clamps into a near anatomic position, I placed two  16-gauge wires to help this fixation.  Fluoroscopy was brought into the  field to confirm the reduction of the fracture.   With the fracture reduced now, I chose a 12-hole plate which was going  to span the fracture in addition to bypass the tip of the previously  placed right hip stem.   I clamped this onto the femur and then confirmed again location of the  plate on the anterior as well as lateral radiographic views.  Once I was  satisfied with the location, I went ahead and placed the 7.3 locking  cannulated screw.  I then placed proximal screw and then placed 4 cables  in the mid section of the comminuted segment holding these all  together.  Clamps were removed.  At this point, I placed 3 bicortical locking  screws into the proximal segment giving me 8 cortices of fixation  proximal, and then I went ahead and placed 3 other screws giving me 4  large cancellus locking screws into the distal femur in addition to the  cables were utilized in the mid section.  Once the screws were placed,  final radiographs were obtained in the AP and lateral planes to confirm  reduction.  I irrigated the wound.  I then reapproximated the iliotibial  band using #1 Vicryl in a number of interrupted and then running  fashion.   The remainder of wound was closed with 2-0 Vicryl and then staples on  the skin.  Skin was cleaned, dried, and dressed sterilely with Mepilex  dressing.  She was brought to the recovery room, extubated in stable  condition.  She was then placed in knee immobilizer with a sterile  dressing.      Madlyn Frankel Charlann Boxer, M.D.  Electronically Signed     MDO/MEDQ  D:  07/13/2008  T:  07/15/2008  Job:  [id number]

## 2010-09-23 NOTE — Discharge Summary (Signed)
NAME:  Dominique Perez, Dominique Perez NO.:  [id number]   MEDICAL RECORD NO.:  [id number]          PATIENT TYPE:  INP   LOCATION:  1430                         FACILITY:  [facility]   PHYSICIAN:  Ollen Gross, M.D.    DATE OF BIRTH:  07-08-1926   DATE OF ADMISSION:  04/30/2008  DATE OF DISCHARGE:  05/07/2008                               DISCHARGE SUMMARY   Priority summary will go with the patient over to the skilled nursing  facility.   ADMITTING DIAGNOSES:  1. Osteoarthritis left knee.  2. Childhood asthma.  3. History of bronchitis.  4. History of pneumonia.  5. Hypertension.  6. Coronary arterial disease.  7. History of atrial fibrillation.  8. Reflux disease.  9. Arthritis.   DISCHARGE DIAGNOSES:  1. Osteoarthritis of the left knee status post left total knee      replacement arthroplasty.  2. Postoperative acute blood loss anemia.  3. Status post transfusion without sequelae.  4. Postoperative atrial fibrillation with rapid ventricular response.  5. Childhood asthma.  6. History of bronchitis.  7. History of pneumonia.  8. Hypertension.  9. Coronary arterial disease.  10.History of atrial fibrillation.  11.Reflux disease.  12.Arthritis.   PROCEDURE:  April 30, 2008 left total knee.  Surgeon Dr. Lequita Halt,  assistant Avel Peace PA-C.   ANESTHESIA:  General.   CONSULTS:  Cardiology, Dr. Deborah Chalk.   BRIEF HISTORY:  The patient is a 77 year old female with severe end-  stage arthritis of the left knee, progressive worsening pain and  dysfunction who failed nonoperative management and now presents for  total knee arthroplasty.   LABORATORY DATA:  Preop CBC showed hemoglobin 13.1, hematocrit 39.9,  white cell count 4.9, platelets 197.  Preop chem panel:  Minimally  elevated glucose of 108.  Remaining chem panel all within normal limits.  Preop UA showed moderate hemoglobin, 0-2 white cells, 0-2 red cells;  otherwise, negative.  Serial CBCs were followed  throughout the hospital  course.  Postop hemoglobin dropped down to 10, then drifted down to 9,  went down to 8.8.  She was given 2 units of blood.  Post transfusion  hemoglobin back up to 11.1.  Last known H and H 11.2 and 33.6.  Serial  protimes followed per Coumadin protocol.  Last known PT/INR 27.3 and  2.4.  Serial BMETS were followed throughout the hospital course, and  electrolytes remained within normal limits.  She had a BNP taken on  December 26 which showed slight elevation of 353.   Chest x-ray April 24, 2008:  Bronchial thickening without infiltrate  or collapse.   She had a preop EKG.  Date on it is April 24, 2008.  Appears to be in  sinus rhythm unconfirmed.  Postop EKG May 03, 2008:  Atrial  fibrillation with rapid ventricular response, nonspecific ST  abnormality.  Consider anterior septal infarct no longer present per Dr.  Aleen Campi.  Follow-up EKG on May 06, 2008:  Sinus rhythm with  premature atrial complexes, atrial fibrillation no longer present  confirmed by Dr. Aleen Campi.   HOSPITAL COURSE:  The patient  admitted to [facility].  Tolerated procedure well, and later transferred to the recovery room of  the orthopedic floor.  Started on PCA and p.o. analgesic pain control  following surgery.  She had been seen preoperatively by Dr. Deborah Chalk of  cardiology.  From a cardiology standpoint she was cleared, and recommend  postop telemetry monitoring.  Cardiology was called to assist with  postoperative management of the patient.  She is placed back on her home  meds.  She is doing pretty well on the morning of day #1.  Pain was  under fairly good control.  She wants to look into [facility] or one  of the other skilled facilities in town so we got Child psychotherapist  involved.  She had decent output.  It was a little bit on the lower  side.  Her pressure was running a little on the lower side so many of  her blood pressure meds were restarted but with  parameters.  These were  adjusted by cardiology to ensure proper care of her hypertension.  She  was rate-controlled.  She was on metoprolol.  She was initially on  Plavix, but that was placed on hold.  She was placed on Coumadin  postoperatively.  Started getting up out of bed by day #2.  She was  doing well.  She had a little low grade temp which was felt to be due to  atelectasis.  Encouraged incentive spirometer if the temp stayed up.  We  were going to continue with workup, and encouraged incentive spirometer,  antipyretics.  She is doing pretty good with her pressures back up in  the 130s.  Output had started picking up even though the hemoglobin had  dropped down to about 9 on postop day #2.  She is asymptomatic at this  point.  With her cardiac history if it continued to fall, we would  transfuse blood if we needed.  Again she was rate controlled at this  point.  Started getting up with therapy.  FL-2 was signed and sent to  help with placement.  By day #3 she was doing well.  Unfortunately, she  converted over to atrial fibrillation.  Cardiology was consulted.  She  was started on a Cardizem drip.  She remained on the Cardizem drip until  she was titrated back down.  She converted back to normal sinus rhythm.  She was placed on amiodarone on December 25.  She was in atrial  fibrillation over the next 2 days.  Amiodarone was titrated for better  control.  By December 26th she was doing well.  Daily dressing change  was initiated on postop day #2.  Incision healing well, no signs of  infection.  Hemoglobin was down a little bit down into the 8s with a  hematocrit down around 26.8.  She did receive 2 units of blood.  Post  procedure hemoglobin back came back up to 11.  This helped with her  rate, easier to control, and also helped her with her therapy.  From a  therapy standpoint she is walking about 60 feet over the weekend.  No  recurrence of atrial fibrillation, only some PACs, a  little bit of  bradycardia at night.  She continued on the amiodarone.  By December 28  she had had another episode early that morning, but that did resolve.  She was on the amiodarone and the Coumadin.  From a cardiology  standpoint, the amiodarone was reduced to 400 mg  every morning.  It was  felt that would be expected to have a little bit of intermittent atrial  fibrillation; otherwise, she was stable at this point.  Cardiology was  okay with her going to skilled facility.  She was doing well from a  therapy standpoint.  The incision was healing well.  She was progressing  with her therapy.  Felt to be stable from an orthopedic standpoint.  We  were looking for a bed.  Once a bed became available, we would transfer  her out at that time.   DISCHARGE/PLAN:  1. Transfer to skilled nursing facility on May 07, 2008.  2. Discharge diagnoses, please see above.  3. Discharge medications include:   a.  Coumadin protocol.  Please titrate the Coumadin level for target INR  between 2.0 and 3.0.  She needs to be on  Coumadin for 4 weeks from the  date of surgery of April 30, 2008.  b.  Colace 100 mg p.o. b.i.d.  c.  Amiodarone 200 mg 2 tablets daily.  d.  Lopressor 50 mg p.o. t.i.d.  e.  Imdur 60 mg p.o. daily.  f.  Zocor 20 mg p.o. daily.  g.  Diltiazem 120 mg p.o. daily.  h.  Prevacid 30 mg p.o. daily.  1. Avalide 150 mg/12.5 mg p.o. daily.  2. Nu-Iron 150 mg p.o. daily.  She may stop this after 2 additional      weeks.  3. Percocet 5 mg one or two every 4 hours as needed for pain.  4. Tylenol 325 one or two every 4-6 hours as needed for mild pain,      temp or headache.  5. Robaxin 500 mg p.o. q.6-8 hours p.r.n .spasm.   DIET:  Cardiac diet.   ACTIVITY:  She is weightbearing as tolerated to the left lower  extremity.  She will need to continue with PT and OT for gait training,  ambulation, ADLs, total knee protocol, weightbearing as tolerated.  She  may shower.  However,  do not submerge the incision under water.  Incision is healing well with no signs of infection at time of  discharge.   FOLLOW UP:  She needs to follow up with Dr. Lequita Halt in the office the  week of January 4th through the 8th.  Please contact the office at [phone]  [phone] to help arrange follow-up appointment at the [employer]  at [facility].   DISPOSITION:  Pending at this time.   CONDITION UPON DISCHARGE:  Improving.      Alexzandrew L. Perkins, P.A.C.      Ollen Gross, M.D.  Electronically Signed    ALP/MEDQ  D:  05/07/2008  T:  05/07/2008  Job:  [id number]   cc:   Colleen Can. Deborah Chalk, M.D.  Fax: [phone]   Skilled nursing facility

## 2010-09-23 NOTE — Op Note (Signed)
NAME:  Dominique Perez, Dominique Perez NO.:  [id number]   MEDICAL RECORD NO.:  [id number]          PATIENT TYPE:  INP   LOCATION:  0006                         FACILITY:  [facility]   PHYSICIAN:  Ollen Gross, M.D.    DATE OF BIRTH:  30-Dec-1926   DATE OF PROCEDURE:  04/30/2008  DATE OF DISCHARGE:                               OPERATIVE REPORT   PREOPERATIVE DIAGNOSIS:  Osteoarthritis, left knee.   POSTOPERATIVE DIAGNOSIS:  Osteoarthritis, left knee.   PROCEDURE:  Left total knee arthroplasty.   SURGEON:  Ollen Gross, M.D.   ASSISTANT:  Alexzandrew L. Perkins, P.A.C.   ANESTHESIA:  General.   ESTIMATED BLOOD LOSS:  General, with postop Marcaine pain pump.   ESTIMATED BLOOD LOSS:  Minimal.   DRAIN:  None.   TOURNIQUET TIME:  34 minutes at 300 mmHg.   COMPLICATIONS:  None.   CONDITION:  Stable to recovery.   BRIEF CLINICAL NOTE:  Dominique Perez is a 77 year old female with severe end-  stage arthritis of her left knee with progressively worsening pain and  dysfunction.  She has failed nonoperative management and presents now  for total knee arthroplasty.   PROCEDURE IN DETAIL:  After successful administration of general  anesthetic, a tourniquet was placed high on her left thigh and left  lower extremity was prepped and draped in the usual sterile fashion.  The extremity was wrapped in Esmarch, knee flexed, tourniquet inflated  to 300 mmHg.  Midline incision was made with the 10-blade through  subcutaneous tissue to the level of the extensor mechanism.  A fresh  blade is used make a medial parapatellar arthrotomy.  Soft tissue over  the proximal medial tibia was subperiosteally elevated to the joint line  with the knife and into the semimembranosus bursa with a Cobb elevator.  Soft tissue laterally was elevated, with attention being paid to avoid  the patellar tendon on tibial tubercle.  The patella subluxed laterally,  knee flexed 90 degrees, and ACL and PCL removed.  A  drill was used to  create a starting hole in the distal femur and the canal was thoroughly  irrigated.  The 5-degree left valgus alignment guide was placed and  referencing off the posterior condyles, rotations marked, and the block  pinned to remove 11 mm from the distal femur.  I took 11 because of her  flexion contracture.  Distal femoral resection was made with an  oscillating saw.  Sizing blocks placed, size 3 was most appropriate.  Rotations marked off the epicondylar axis.  Size 3 cutting block was  placed and the anterior-posterior and chamfer cuts made.   The tibia subluxed forward and the menisci are removed.  The  extramedullary tibial alignment guide was placed, referencing proximally  at the medial aspect of the tibial tubercle and distally along the  second metatarsal axis and tibial crest.  Blocks pinned to remove 2 mm  from the more deficient medial side.  Tibial resection is made with an  oscillating saw.  Size 3 was the most appropriate tibial component and  the proximal tibia was prepared  to modular drill and keel punch for the  size 3.  Femoral preparation was completed with intercondylar cut.   Size 3 mobile-bearing tibial trial and size 3 posterior stabilized  femoral trial and 10-mm posterior stabilized rotating platform insert  trial are placed.  With the 10, full extension was achieved with  excellent varus-valgus, anterior and posterior balance throughout full  range of motion.  Patella was everted and thickness measured to be 24  mm.  Freehand resection taken to 14 mm, 35 template is placed, lug holes  were drilled, trial patella was placed and it tracks normally.  Osteophytes were removed off the posterior femur with the trial in  place.  All trials were removed and the cut bone surfaces are prepared  with pulsatile lavage.  Cement was mixed and once ready for implantation  a size 3 mobile-bearing tibial tray, size 3 posterior stabilized femur,  and 35  patella are cemented into place and the patella was held with a  clamp.  Trial 10-mm insert was placed, the knee held in full extension,  and all extruded cement removed.  When the cement had fully hardened,  then the permanent 10-mm posterior stabilized rotating platform insert  was placed into the tibial tray.  The wound was copiously irrigated with  saline solution and then FloSeal was injected on the posterior capsule,  medial and lateral gutters and suprapatellar area.  Moist sponge was  placed and tourniquet released with a total time of 34 minutes.  The  sponge was held for 2 minutes and removed.  Minimal bleeding was  encountered.  The bleeding that is encountered is stopped with  electrocautery.  The wound is again irrigated and the arthrotomy then  closed with interrupted #1 PDS.  Flexion against gravity is about 140  degrees.  Subcu was closed with interrupted 2-0 Vicryl and subcuticular  running 4-0 Monocryl.  Incision was cleaned and dried and Steri-Strips  and a bulky sterile dressing were applied.  She was then placed into a  knee immobilizer, awakened, and transported to recovery in stable  condition.      Ollen Gross, M.D.  Electronically Signed     FA/MEDQ  D:  04/30/2008  T:  04/30/2008  Job:  [id number]

## 2010-09-23 NOTE — Discharge Summary (Signed)
NAME:  Dominique Perez, Dominique Perez                 ACCOUNT NO.:  [id number]   MEDICAL RECORD NO.:  [id number]          Perez TYPE:  INP   LOCATION:  1611                         FACILITY:  [facility]   PHYSICIAN:  Ollen Gross, M.D.    DATE OF BIRTH:  03/14/1927   DATE OF ADMISSION:  11/04/2008  DATE OF DISCHARGE:  11/13/2008                               DISCHARGE SUMMARY   ADMITTING DIAGNOSES:  1. Right femur fracture.  2. Coronary arterial disease.  3. Hypertension.  4. Arthritis.  5. Reflux.  6. Hyperlipidemia.   DISCHARGE DIAGNOSIS:  1. Right periprostatic femur fracture nonunion status post ORIF right      periprosthetic femur fracture, hardware removal, subsequent      intermedullary nailing and femoral strut grafting.  2. Coronary arterial disease.  3. Hypertension.  4. Arthritis.  5. Reflux.  6. Hyperlipidemia.  7. Postop acute blood loss anemia.  8. Status post transfusion without sequelae.  9. Postop mild renal insufficiency improved.   PROCEDURE:  Mont[place] 30, 2010, ORIF right periprosthetic femur fracture  hardware removal and placement, intramedullary nailing and femoral strut  grafting.  Surgeon Dr. Lequita Halt.  Assistant, Avel Peace PA-C.  Anesthesia general.   CONSULTATIONS:  None.   BRIEF HISTORY:  Dominique Perez is a 81-year female with a right  periprosthetic femur fracture fixed by Dr. Charlann Boxer in March, set fracture  between total hip and knees, locking plate well initially.  Past week  fell to snap when walking.  A x-ray showed platelets broken, and she was  admitted for intervention.   LABORATORY DATA:  CBC on admission:  Hemoglobin 13.6, white count 6.6,  hematocrit 40.7.  PT/INR 13.3, 1.0 with PTT of 27.  Preop UA showed  urinary tract infection.  Follow-up UA repeat also showed that.  She was  treated.  Serial CBCs, hemoglobin dropped 10.2 then 8.4 given 2 units of  blood.  Post discharge hemoglobin back to 9.2.  Serial protimes  followed.  Last PT/INR 13.9 and 1.0.   Serial B mets were followed.  Electrolytes remained within normal limits.  Creatinine went up from 0.9-  1.5 back down to 0.8.   X-RAYS:  Inner upper films used for intramedullary nailing.   EKG:  Khaliyah 27, 2010, normal sinus rhythm, minimal __________LVH, no  significant change since July 13, 2008.   HOSPITAL COURSE:  Dominique Perez was admitted to [facility],  placed at bedrest and given pain medications until Dr. Lequita Halt returned.  She was admitted over Dominique weekend.  He came and had seen Dominique Perez.  Dominique Perez found to have Dominique hardware failure with fracture.  It was  felt she would require surgical intervention.  She was preop.  She was  taken to [facility]ique OR on Akacia 30, 2010.  She underwent Dominique above procedure  without complications, tolerated procedure well, doing pretty well on  day #1, although she had a little bit of increase in her creatinine.  She was given fluids.  Started back on her home medications.  Had good  output by day #2.  Dominique creatinine had  increased in Dominique afternoon of day  #1, but was better by day #2.  Hemovac drains placed at Dominique time of  surgery were removed without difficulty, although, hemoglobin dropped  down to 8.4.  She was given 2 units of blood which also helped out with  Dominique renal insufficiency.  She tolerated Dominique blood well.  She was placed  touchdown, toe-touch weightbearing, had difficulty getting around.  She  was slow to progress with therapy, required moderate assist.  They did  get a rehab consult at Dominique end of Dominique week.  Dominique Perez was seen and  evaluated and felt that she would not be appropriate for inpatient  rehab.  She stayed through Dominique weekend and Dominique holiday, receiving daily  therapy.  Her creatinine improved.  She was very slow to progress.  She  remained in [facility] hospital for several more days, receiving daily therapy,  and by July 6, she was doing better.  She was seen on rounds.  We did  look into skilled facility and a bed  became available at [facility].  Therefore, we were going to transfer her over at that time.   DISCHARGE/PLAN:  1. Dominique Perez was transferred over to [facility] on November 13, 2008.  2. Discharge diagnoses, please see above.   DISCHARGE MEDICATIONS:  1. Imdur 60 mg daily.  2. Zocor 20 mg daily.  3. Lovenox 40 mg daily.  She will remain on Lovenox for seven more      days then discontinue Lovenox.  4. Colace 100 mg p.o. b.i.d.  5. Lopressor 25 mg p.o. b.i.d.  6. Multaq 400 mg every 12 hours.  7. Robaxin 500 mg p.o. q.6-8 h p.r.n. spasm.  8. Vicodin 5 mg one or two every 4-6 hours need for pain.  9. Ambien 5 mg p.o. q.h.s. p.r.n. sleep.  10.Tylenol 325.2 every 4-6 hours as needed for mild pain, temper      headache.  11.Laxative of choice.  12.Enema of choice.   DISCHARGE INSTRUCTIONS:  Activity.  She is to Dominique touch or touchdown  weightbearing only to Dominique right lower extremity.  She needs to get out  of bed minimum b.i.d. or PT/OT to gait training, ambulation, ADLs.  Very  gentle range of motion to Dominique knee.  She is healing a periprostatic  fracture which has been nailed and also allograft strut grafting.  Only  VERY GENTLE passive range of motion for Dominique knee.  No aggressive  exercises for Dominique knee at this time, but she does need to be up  ambulating.   FOLLOWUP:  She needs to follow up with Dr. Lequita Halt in Dominique office on  Tuesday, July 13 for staple removal and wound check.   DISPOSITION:  [facility].   CONDITION ON DISCHARGE:  Improving.      Alexzandrew L. Perkins, P.A.C.      Ollen Gross, M.D.  Electronically Signed    ALP/MEDQ  D:  11/13/2008  T:  11/13/2008  Job:  [id number]

## 2010-09-23 NOTE — H&P (Signed)
NAME:  Dominique Perez, Dominique Perez NO.:  [id number]   MEDICAL RECORD NO.:  [id number]          PATIENT TYPE:  EMS   LOCATION:  ED                           FACILITY[facility]  [facility]   PHYSICIAN:  Lucita Ferrara, MD         DATE OF BIRTH:  1926-09-04   DATE OF ADMISSION:  07/11/2008  DATE OF DISCHARGE:                              HISTORY & PHYSICAL   CHIEF COMPLAINT:  Fall.   HISTORY OF PRESENT ILLNESS:  The patient is a 77 year old, status post  fall, per the patient per emergency room physician __________.  The  patient did not have any chest pain, shortness of breath, nausea, or  vomiting prior to fall.  The patient did not have any focal neurological  deficits after the fall.  The patient fell on the right side.  She  complains of right leg pain.  She has a history of supposed multiple  orthopedic procedures.  She was recently discharged May 07, 2008,  under Dr. Homero Fellers Aluisio's service for osteoarthritis of the left knee,  status post left total knee replacement.  She does see cardiology, Dr.  Deborah Chalk for coronary artery disease.  She had her last catheterization  back on Mella 8, 2007, which was a left heart catheterization and stent  placement in the first diagonal.   PAST MEDICAL HISTORY:  The patient's past medical history is significant  for the following:  1. Childhood asthma.  2. History of bronchitis.  3. History of pneumonia.  4. Hypertension.  5. Coronary artery disease, status post stent placement.  6. History of atrial fibrillation.  7. Gastroesophageal reflux disease.  8. Osteoarthritis.   PAST SURGICAL HISTORY:  As above.  1. Status post total knee replacement.  2. Status post cardiac catheterization and angioplasty.  3. Status post stenting in 1999.  4. Recatheterization in Andrew 2007 with 1 stent.  5. Status post right total knee replacement.  6. Status post left total knee replacement.   FAMILY HISTORY:  Father had lung cancer.  Mother had a myocardial  infarction.   SOCIAL HISTORY:  The patient is widowed, retired, nonsmoker.  Denies  drugs, alcohol or tobacco.  Lives alone.   REVIEW OF SYSTEMS:  As per HPI, otherwise negative.   PHYSICAL EXAMINATION:  GENERAL:  The patient is in no acute distress.  VITAL SIGNS:  Temperature 98.2, pulse 63, respirations 20, blood  pressure 151/67, pulse oximetry 97% on room air.  HEENT/NECK:  Normocephalic, atraumatic, sclerae anicteric.  Neck is  supple.  No JVD or carotid bruits.  PERRLA.  Extraocular muscles intact.  CARDIOVASCULAR:  S1, S2.  Regular rate and rhythm.  No murmurs, rubs,  clicks.  LUNGS:  Clear to auscultation bilaterally; no rhonchi, rales or wheezes.  ABDOMEN:  Soft, nontender, nondistended.  Positive bowel sounds.  EXTREMITIES:  No clubbing, cyanosis or edema.   INR is 1.1.  Urinalysis shows small amount of leukocytes.  Cardiac  markers are essentially negative.  X-ray of the knee shows comminuted,  mildly displaced, mid to distal right femoral fracture.  There is a  large joint effusion.  There are 11-20 white blood cell count in the  urine.   ASSESSMENT:  The patient is a 77 year old with the following:  1. Status post fall and sustained a displaced comminuted fracture of      the mid to distal right femur.  2. Large joint effusion.  3. Urinary tract infection.  4. History of coronary disease, status post catheterization x2, status      post stent.   DISCUSSION AND PLAN:  The patient will be admitted to the medical  telemetry floor.  Syncope workup will be initiated.  The patient will  have a 2-D echocardiogram, cardiac enzymes x3 q.8 h. and bilateral  carotid Dopplers.  The patient would need a CT scan of the head,  although she is reluctant to get one at this point.  Dr. Ollen Gross  has already been consulted in regards to the fracture.  Definitive  treatment per orthopedic surgery.  Watch vitals and hemoglobin.  Watch  for signs of bleeding.  The rest of plans  will depend on her progress.  DVT prophylaxis per orthopedic surgery.      Lucita Ferrara, MD  Electronically Signed     RR/MEDQ  D:  07/11/2008  T:  07/11/2008  Job:  [id number]

## 2010-09-23 NOTE — Procedures (Signed)
DUPLEX DEEP VENOUS EXAM - LOWER EXTREMITY   INDICATION:  Right lower extremity edema.   HISTORY:  Edema:  Yes.  Trauma/Surgery:  Yes.  Pain:  No.  PE:  No.  Previous DVT:  No.  Anticoagulants:  None.  Other:   DUPLEX EXAM:                CFV   SFV   PopV  PTV    GSV                R  L  R  L  R  L  R   L  R  L  Thrombosis    o  o  o     o     o      o  Spontaneous   +  +  +     +     +      +  Phasic        +  +  +     +     +      +  Augmentation  +  +  +     +     +      +  Compressible  +  +  +     +     +      +  Competent     +  +  +     +     +      +   Legend:  + - yes  o - no  p - partial  D - decreased   IMPRESSION:  No evidence of deep venous thrombosis noted in the right  leg.    _____________________________  Di Kindle. Edilia Bo, M.D.   MG/MEDQ  D:  09/07/2008  T:  09/07/2008  Job:  [id number]

## 2010-09-26 NOTE — Discharge Summary (Signed)
NAME:  Dominique Perez, SEDLACK NO.:  [id number]   MEDICAL RECORD NO.:  [id number]                   PATIENT TYPE:  INP   LOCATION:  5036                                 FACILITY:  [facility]   PHYSICIAN:  Claude Manges. Cleophas Dunker, M.D.            DATE OF BIRTH:  [birth year]   DATE OF ADMISSION:  09/18/2003  DATE OF DISCHARGE:  09/21/2003                                 DISCHARGE SUMMARY   ADMITTING DIAGNOSES:  1. End stage osteoarthritis, right hip.  2. End stage osteoarthritis, left knee.  3. Coronary artery disease with ejection fraction of 72%.  No ischemia on     last Cardiolite.  4. Hypertension.  5. Hypercholesterolemia.  6. Gastroesophageal reflux disease.   DISCHARGE DIAGNOSES:  1. Status post right total hip replacement.  2. Acute blood loss anemia secondary to surgery requiring two units of     packed red blood cells.  3. Pyrexia, resolved.  4. Right lower lobe crackles heard on auscultation, resolved.  5. End stage osteoarthritis, left hip.  6. Coronary artery disease with ejection fraction of 72%.  7. Hypertension.  8. Hypercholesterolemia.  9. Gastroesophageal reflux disease.  10.      Osteoarthritis of the left knee.   HISTORY OF PRESENT ILLNESS:  [name] is a 77 year old white female with a  history of right total knee arthroplasty, in 1990, with good results.  Over  the past six months, the patient has developed severe pain and giving way of  her right hip.  She denies any previous injury.  The pain is described as  sharp and dull discomfort, mostly located in the posterior buttocks up into  the lower back.  She denies any grinding in the hip.  Ambulation,  transitioning from sitting to standing, and laying down all cause  significant pain in her right hip.  The patient's current situation is  complicated due to end stage osteoarthritis of the left knee.  X-rays of the  right hip reveal advanced osteoarthritis with a deformed hip.   ALLERGIES:  1. SULFA.  2. ERYTHROMYCIN.  3. X-RAY DYE.  4. A medication in her last epidural steroid injection.   CURRENT MEDICATIONS:  1. Avalide 150/12.5 mg p.o. every day.  2. Metoprolol 50 mg p.o. b.i.d.  3. Ecotrin 325 mg p.o. every day.  4. Centrum one tablet p.o. every day.  5. Extra Strength Tylenol two tabs every day.  6. Voltaren 10/40 mg p.o. every other day.  7. Prevacid 30 mg p.o. every day.   SURGICAL PROCEDURE:  The patient was taken to the operating room, on Sep 18, 2003, by Dr. Norlene Campbell, assisted by Lenard Galloway. Chaney Malling, M.D. and  Legrand Pitts. Duffy, P.A.  The patient was placed under general anesthesia.  A  right total hip replacement was performed.  The following components were  used __________ 52-mm auto-diameter 100 series metallic acetabular component  with  an apex full eliminator and a  __________  with a 10 degree lip, also  used a 32-mm outer diameter hip ball with a +13 neck length and a small  stature 12-mm femoral component Prodigy.  The patient tolerated the  procedure well and returned to recovery in good and stable condition.   CONSULTS:  The following consults were obtained while the patient was  hospitalized:  1. PT.  2. OT.  3. Rehab.  4. Case management.  5. Pharmacy.   HOSPITAL COURSE:  The patient developed acute blood loss anemia secondary to  surgery.  She became tachycardic, blood pressure decreased; therefore, she  was transfused with two units of packed red blood cells.  At that time, her  hemoglobin was 8.4, and her hematocrit was 24.1.  At time of discharge to  rehab, the patient's hemoglobin was 10, hematocrit was 28.3.  Post-op day  two, the patient's T-max was 101.7, and she had crackles in the right lower  base.  She was encouraged to use the incentive spirometry.  Post-op day  three, she was afebrile.  The right lower lobe is clear on auscultation.  The patient, therefore, was discharged to rehab in good and stable   condition.   CBC, on admission, was within normal limits.  Hemoglobin and hematocrit were  10 and 28.3 at the time of discharge to rehab.  Coags, on admission, PT  12.4, INR 0.4, PTT 26.  Routine chemistries, on admission, were within  normal limits, except for a glucose which was 106.  Hepatic enzymes, on  admission, were all within normal limits.   EKG, on admission, dated Sep 12, 2003, showed a normal sinus rhythm with a  heart rate of 60 beats per minute.  The PR interval was 168 milliseconds.  The P, R, T axis 117, 20, and 69.   X-RAYS:  Right total hip performed postoperatively, Sep 18, 2003, showed  satisfactory postoperative appearance of the right total hip prosthesis.   DISCHARGE INSTRUCTIONS:  1. Colace 100 mg p.o. b.i.d.  2. Senokot one p.o. b.i.d. a.c.  3. Laxative of choice p.r.n. constipation.  4. Coumadin per pharmacy protocol.  5. Phenergan 12.5 IV/25 mg IM p.o. q.6h. p.r.n. nausea.  6. Percocet 5/325, 1-2 tablets p.o. q.4-6h. p.r.n. pain.  7. Acetaminophen 325-650 mg p.o. q.4h. temperature greater than 101.5.  8. Robaxin 500-1,000 mg p.o. q.6h. p.r.n. spasm.  9. Restoril 15 mg p.o. q.h.s. p.r.n. sleep.  10.      Reglan 10 mg p.o./IV q.8h. p.r.n. nausea.  11.      Avalide 150/12.5 mg p.o. q.a.m.  12.      Metoprolol 50 mg p.o. b.i.d.  13.      Multivitamin one p.o. q.a.m.  14.      Vytorin 10/40 mg p.o. q.h.s.  15.      Prevacid 30 mg p.o. q.a.m.   ACTIVITY:  Partial weightbearing 50% evaluate.   DIET:  Advance as tolerated.   CONDITION ON DISCHARGE:  The patient was discharged to rehab in a good and  stable condition.   FOLLOW UP:  The patient needs followup with Dr. Cleophas Dunker in the office  fourteen days post-op.  At that time, staples will be removed and x-rays  taken.  The patient needs to call our office at [phone] for an appointment.   SPECIAL CARE INSTRUCTIONS:  Daily pro-time following Coumadin.  WOUND CARE:  The patient needs to keep the wound  clean and dry.  Daily  dressing changes are to be made.  The patient is to call our office if any  signs of infection or if pain is not under control with current medications.      Dominique Perez, P.A.                       Claude Manges. Cleophas Dunker, M.D.    GC/MEDQ  D:  10/23/2003  T:  10/24/2003  Job:  [id number]

## 2010-09-26 NOTE — H&P (Signed)
NAME:  Dominique Perez, Dominique Perez NO.:  [id number]   MEDICAL RECORD NO.:  [id number]                   PATIENT TYPE:  INP   LOCATION:                                       FACILITY:  [facility]   PHYSICIAN:  Claude Manges. Cleophas Dunker, M.D.            DATE OF BIRTH:  Sep 29, 1926   DATE OF ADMISSION:  09/18/2003  DATE OF DISCHARGE:                                HISTORY & PHYSICAL   CHIEF COMPLAINT:  Severe right hip the last six months.   HISTORY OF PRESENT ILLNESS:  The patient is a 77 year old white female with  history of right total knee arthroplasty in 1990 with good results. Over the  last six months, she has been developing severe pain and giving way of her  right hip. The patient denies any previous injury. She describes the pain as  a sharp and dull discomfort, mostly located at posterior buttocks up into  the lower back. She does not have grinding. She does have significant  difficulty with ambulation, transitioning from sitting to standing, and  laying down with significant night pain. Current situation is complicated  with additional end-stage osteoarthritis, left knee. X-rays reveal advanced  osteoarthritis, right hip, with deformed hip.   ALLERGIES:  SULFA, ERYTHROMYCIN, X-RAY IV DYE, and A MEDICATION IN HER LAST  EPIDURAL STEROID INJECTION.   CURRENT MEDICATIONS:  1. Avalide 150/12.5 mg p.o. q.d.  2. Metoprolol 50 mg p.o. b.i.d.  3. Ecotrin 325 mg p.o. q.d.  4. Centrum one tablet p.o. q.d.  5. Extra Strength Tylenol two q.d.  6. Voltaren 10/40 p.o. every other day.  7. Prevacid 30 mg p.o. q.d.   PAST MEDICAL HISTORY:  1. Hypertension.  2. Coronary artery disease with last Cardiolite stress testing indicating     72% ejection fraction and no ischemia.  3. Hypercholesterolemia.  4. GERD.   PAST SURGICAL HISTORY:  1. Tonsillectomy in 1932.  2. Two bunionectomies in 1974.  3. Right tibial osteotomy in 1988.  4. Right total knee arthroplasty in 1990.  The patient denies any     complications for the above mentioned surgical procedures.   SOCIAL HISTORY:  The patient is a 77 year old white female who appears  fairly healthy. Denies any history of smoking or alcohol use. She is  married. She has three grown children. Lives in a [place]. She is  retired.   FAMILY PHYSICIAN:  Dr. Dara Hoyer at [phone].   CARDIOLOGIST:  Dr. Roger Shelter.   FAMILY HISTORY:  Mother is deceased from a heart attack. Father is deceased  from lung cancer. She has no other siblings.   REVIEW OF SYSTEMS:  Positive for a form of allergic reaction with her last  epidural steroid injections this last March, instigating a minor asthma  attack which was treated with medications and severe rash. The patient does  have upper and lower dentures. She does have some  shortness of breath with  exertion related to poor condition, but she denies any related chest  discomfort or diaphoresis with this. She does have some problems with reflux  which is fairly well controlled with Prevacid. She does also have history of  increased urinary urgency and frequency but no further treatment.   PHYSICAL EXAMINATION:  VITAL SIGNS:  Height is 5 foot 6, weight is 168  pounds. Blood pressure is 132/80, pulse is 60 and regular, respirations 12.  The patient is afebrile.  GENERAL:  This is a healthy appearing slight heavy set white female,  ambulates very slow. She is able to get herself in and out of the chair but  must use her arms. She needs assistance to get up onto the exam table.  HEENT:  Head is normocephalic. Pupils are equal, round, and reactive and  accommodating to light. Extraocular movements intact. External ears were  without deformities. Gross hearing is intact. Nasal septum was midline. Oral  buccal mucosa was pink and moist. Dentures were in place at this time.  NECK:  Supple. No palpable lymphadenopathy. Thyroid region was nontender.  She had excellent  range of motion of her cervical spine without any  difficulty or tenderness.  CHEST:  Lung sounds were distant but clear and equal bilaterally. No  wheezes, rales, rhonchi, or rubs.  HEART:  Regular rate and rhythm. S1 and S2. No murmurs, rubs, gallops noted.  ABDOMEN:  Round, soft, nontender. Bowel sounds were normal active. She was  nontender to deep palpation or percussion of the CVA region.  EXTREMITIES:  Upper extremities were symmetrically sized and shaped. She had  good range of motion of her shoulders, elbows, and wrists. Motor strength  was 5/5. Right lower extremity, hip had full extension, flexion to 90  degrees. She had pain and limited range of motion 10 degrees internal and  external rotation. Right knee had a well healed midline surgical incision,  typical mechanical knee replacement clunk instability, no effusion. Calf was  nontender. Left hip had full extension, flexion up to 110 degrees, 20  degrees internal and external rotation without any difficulty. Left knee had  round, boggy appearing. She was tender throughout. No palpable effusion. No  erythema or ecchymosis. She had 10 degrees extension, flexion to 95 degrees.  She did open up with varus and valgus stressing about 5 degrees. Calf was  negative. Ankles were symmetrical with good dorsi/plantar flexion.  PERIPHERAL VASCULAR:  Carotid pulses were 2+, no bruits. Radial pulses 2+.  Dorsalis pedis pulses were 2+. No lower extremity edema or venous stasis  changes noted.  NEUROLOGICAL:  The patient was conscious, alert, and appropriate. Held an  easy conversation with the examiner. Cranial nerves II-XII were grossly  intact. She had no gross neurologic defects noted.  BREAST, RECTAL, AND GENITOURINARY:  Deferred at this time.   IMPRESSION:  1. End-stage osteoarthritis, right hip.  2. End-stage osteoarthritis, left hip.  3. Coronary artery disease with ejection fraction of 72%, no ischemia last     Cardiolite. 4.  Hypertension.  5. Hypercholesterolemia.  6. Gastroesophageal reflux disease.   PLAN:  The patient has been evaluated by Dr. Roger Shelter for this  upcoming surgical procedure. He did feel that she was an acceptable surgical  risk with no other recommendations. The patient will undergo all routine  labs and tests prior to having a right total knee arthroplasty on May 10 by  Dr. Norlene Campbell.      Jamelle Rushing,  P.A.                      Claude Manges. Cleophas Dunker, M.D.    RWK/MEDQ  D:  09/10/2003  T:  09/10/2003  Job:  [id number]

## 2010-09-26 NOTE — Cardiovascular Report (Signed)
NAME:  Dominique Perez, Dominique Perez NO.:  [id number]   MEDICAL RECORD NO.:  [id number]          PATIENT TYPE:  OIB   LOCATION:  2807                         FACILITY:  [facility]   PHYSICIAN:  Colleen Can. Deborah Chalk, M.D.DATE OF BIRTH:  [birth year]   DATE OF PROCEDURE:  10/16/2005  DATE OF DISCHARGE:                              CARDIAC CATHETERIZATION   PROCEDURE:  Left heart catheterization with selective coronary angiography,  left ventricular angiography and stent placement in the first diagonal  vessel of the left anterior descending.   TYPE AND SIGHT OF ENTRY:  Percutaneous left femoral artery.   CATHETERS:  Six-French 4 curved Judkins right and left coronary catheters, 6-  French pigtail ventriculographic catheter, guide catheters being a JL-3.5,  Asahi soft, and 2.0 x 8-mm Maverick balloon, subsequently had a 2.5 x 23 mm  Cypher stent.   MEDICATIONS GIVEN PRIOR TO PROCEDURE:  Benadryl 25 mg IV, Valium 5 mg.   MEDICATIONS GIVEN DURING THE PROCEDURE:  Fentanyl 25 mg IV, Angiomax, IV  nitroglycerin, intracoronary nitroglycerin, morphine sulfate, Plavix, Ancef.   COMMENT:  The patient tolerated the procedure well.   HEMODYNAMIC DATA:  The aortic pressure was 111/57, LV was 122/2-15.  There  was no aortic valve gradient on pullback.   ANGIOGRAPHIC DATA:  1.  Right coronary artery.  The right coronary artery had stents in the      right coronary artery.  The right coronary artery had minor      irregularities otherwise, but the stents were patent.  It was a large      right coronary artery with excellent flow present.  2.  Left main coronary artery was normal.  3.  Left circumflex.  Left circumflex was a relatively small vessel.  It had      minor irregularities.  4.  Left anterior descending.  Left anterior descending was long vessel.  It      had a large first diagonal vessel that had a 90% ostial stenosis.  There      was only a very slight dimple in the left anterior  descending that was      the area where the diagonal began.  There were irregularities in the      proximal one half of the left anterior descending.  The second diagonal      had a 40 to 50% narrowing in its origin.  5.  Left ventricular angiogram was performed in the RAO position.  Overall      cardiac size and silhouette were normal.  The global ejection fraction      was 60%.   ANGIOPLASTY PROCEDURE:  We chose a JL-3.5 guide.  We used an Asahi soft with  a double bend to enter this first diagonal vessel.  We predilated with a 2.0  x 8-mm Maverick balloon.  The angle of the origin of the diagonal was  somewhat acute, and we were not certain that a stent would fit.  However,  the proximal portion of the diagonal had at least 20 mm of diffuse  irregularities and narrowing relative to  the distal portion of the vessel.  I chose a 2.5 x 23 mm stent, and fortunately, the length was exceptionally  appropriate and fit right at the ostium and ended prior to a slight  tortuosity in this diagonal.  The stent was inflated to a maximum of 10  atmospheres.  The final angiographic result was felt to be excellent with  the stent remaining out of the left anterior descending proper but covering  almost the entire diffusely diseased portion of the proximal diagonal  vessel.   OVERALL IMPRESSION:  1.  Severe stenosis in the proximal first diagonal vessel with successful      stent placement.  2.  Normal left ventricular function.  3.  Persistent patency of the previous stents in the right coronary artery      with minor diffuse irregularities otherwise.   DISCUSSION:  Ms. Gunner will need to be on Plavix for a year.  Overall, it  felt that she should have a good prognosis.      Colleen Can. Deborah Chalk, M.D.  Electronically Signed     SNT/MEDQ  D:  10/16/2005  T:  10/17/2005  Job:  [id number]

## 2010-09-26 NOTE — Consult Note (Signed)
NAME:  Dominique Perez, Dominique Perez NO.:  [id number]   MEDICAL RECORD NO.:  [id number]                   PATIENT TYPE:  INP   LOCATION:  3735                                 FACILITY:  [facility]   PHYSICIAN:  Meade Maw, M.D.                 DATE OF BIRTH:  [birth year]   DATE OF CONSULTATION:  09/22/2003  DATE OF DISCHARGE:                                   CONSULTATION   INDICATIONS FOR CONSULTATION:  Chest pain.   HISTORY:  Dominique Perez is very pleasant 77 year old female who was admitted  on Sep 18, 2003. She underwent right hip replacement. She was transferred to  rehab on May 13. Her postoperative course was relatively uneventful. May 15  at approximately 10:45 a.m. the patient developed throat pain as if she had  difficulty swallowing. This radiated down her chest and to her back, and the  chest pain persisted for approximately 10 to 15 minutes. It was similar in  nature to her chest pain in 1999 at which time she underwent stent  deployment. She has had no further chest pain since 1999. She underwent an  elective catheterization in 2000 for attempts at intervention on the first  diagonal. The first diagonal was found to be not amendable to intervention,  and it was elevated to proceed with medical management. She has continued to  follow with Dr. Deborah Chalk. She has had no chest pain. Her activity has been  limited by her arthritic pain. Her chest pain this morning was associated  with anxiety symptoms but no nausea, vomiting, or diaphoresis. ECG at the  time of the chest pain revealed a sinus rhythm but no acute ischemic  changes.   PAST MEDICAL HISTORY:  1. Coronary artery disease status post catheterization April 2002. At this     time, she was noted to have normal EF. The stent placed in the right     coronary artery was patent. She had moderate stenosis at the proximal LAD     with severe disease in the small first diagonal. Circumflex flex vessel     was  mild with minimal disease.  2. Dyslipidemia.  3. Osteoarthritis.  4. Cholelithiasis.  5. Hypertension.   ALLERGIES:  ERYTHROMYCIN AND SULFA.   CURRENT MEDICATIONS:  1. Protonix 40 mg daily.  2. Multivitamin.  3. Trinsicon daily.  4. Lovenox for DVT protocol.  5. Coumadin adjusted for INR of 2 to 3.  6. Trazodone p.o. q.h.s.  7. Tylenol p.r.n.  8. Robaxin p.r.n.  9. Avapro 150 mg daily.  10.      Hydrochlorothiazide 12.5 mg daily.  11.      Lopressor 50 mg p.o. b.i.d.  12.      Zetia 10 mg daily.  13.      Zocor 40 mg daily.   FAMILY HISTORY:  Noncontributory.   SOCIAL HISTORY:  She is married. Her  husband is suffering from dementia and  depends heavily on her support.   REVIEW OF SYSTEMS:  She has had activity limited by her osteoarthritic pain  as noted above.   PHYSICAL EXAMINATION:  VITAL SIGNS:  Blood pressure 110/60, respiratory rate  18 to 20. She is afebrile. Heart rate is 90 beats per minute. She currently  is pain free.  HEENT:  Unremarkable.  PULMONARY:  Reveals breath sounds which are equal and clear to auscultation.  CARDIOVASCULAR:  Reveals a regular rate and rhythm. Normal PMI.  ABDOMEN:  Soft, benign, nontender.  EXTREMITIES:  Revealed no peripheral edema.  NEUROLOGICAL:  Nonfocal.  SKIN:  Warm and dry.   LABORATORY DATA:  ECG is as noted above. Her white count is 8.1, hemoglobin  10.0, hematocrit 28.3. Normal electrolytes, glucose 119.   IMPRESSION:  23. A 77 year old female with recurrent chest pain similar to her previous     anginal pain, known critical disease in a small diagonal. Will transfer     to telemetry. Restart aspirin. Initial Imdur 60 mg daily and sublingual     nitroglycerin p.r.n. Will change Lovenox to cardiology protocol. Will     proceed with an adenosine Cardiolite on Monday.  2. Hypertension. Blood pressure is well controlled.  3. Gastritis. Would continue with Protonix.  4. Dyslipidemia. Would continue with Zocor and Zetia as  noted above.                                               Meade Maw, M.D.    HP/MEDQ  D:  09/22/2003  T:  09/22/2003  Job:  [id number]   cc:   Ranelle Oyster, M.D.  [address] [place] [address]  [place] [address]  Fax: [phone]   Colleen Can. Deborah Chalk, M.D.  Fax: [phone]

## 2010-09-26 NOTE — Discharge Summary (Signed)
NAME:  Dominique Perez, Dominique Perez NO.:  [id number]   MEDICAL RECORD NO.:  [id number]                   PATIENT TYPE:  INP   LOCATION:  3739                                 FACILITY:  [facility]   PHYSICIAN:  Sharlee Blew, N.P.               DATE OF BIRTH:  1926-10-17   DATE OF ADMISSION:  09/22/2003  DATE OF DISCHARGE:  09/28/2003                                 DISCHARGE SUMMARY   PRIMARY DISCHARGE DIAGNOSES:  1. Chest pain with negative cardiac enzymes.  She is to continue with     medical therapy.  2. Recent right total hip replacement.  3. Coumadin anticoagulation.  4. Gastroesophageal reflux disease.  5. Known ischemic heart disease with previous stent placement to the right     coronary in May, 1999 with her last catheterization in April, 2002 which     demonstrated normal left ventricular function, patent stent in the right     coronary, moderate 50% left anterior descending narrowing with severe     disease in the first small diagonal with a 50% narrowing in a mild to     moderate proximal portion of the second diagonal.  It was felt best to     manage her medically and is felt that she will have some residual angina,     but should be of low risk.  6. Hyperlipidemia.  7. Hypertension.  8. Osteoarthritis.   HISTORY OF PRESENT ILLNESS:  The patient is a 77 year old white female who  presents for elective total hip replacement.  She was admitted on Sep 18, 2003 under the service of Claude Manges. Whitfield, M.D. and underwent right total  hip replacement that day.  She was transferred to rehabilitation on May 13th  and had been on Coumadin and Lovenox for DVT prophylaxis.  She basically had  done well until the morning of transfer to telemetry, when while eating, she  had the onset of neck pain.  Her medicines had been substituted here in the  hospital.  She was subsequently seen by cardiology and was transferred to  telemetry.   Please see the History and  Physical for further patient presentation and  profile.   LABORATORY DATA:  Cardiac enzymes were negative.   HOSPITAL COURSE:  The patient was readmitted to the hospital from the  Rehabilitation Department on Sep 22, 2003.  She ruled out negative.  We did  not proceed on with any further noninvasive testing.  She has had previous  Adenosine/Cardiolite that served as preoperative clearance.  She will  continue with medical management.  Physical therapy was subsequently  reinitiated and plans were made for her to be allowed to take her on proton  pump inhibitor.  She continued to progress quite nicely and today, on Sep 28, 2003, she was felt to be a satisfactory candidate for discharge.  She  has had no  further complaints of chest pain.  Her physical exam is  unremarkable.   DISCHARGE CONDITION:  Stable.   DISCHARGE MEDICATIONS:  She will resume her:  1. Avalide 150/12.5 daily.  2. Metoprolol 50 b.i.d.  3. Aspirin daily.  4. Vitamin daily.  5. Tylenol p.r.n.  6. Voltaren 10/40 daily.  7. Prevacid 30 mg a day.  We will be adding:  1. Imdur 60 mg a day.  2. Coumadin will be 5 mg a day.  She will start this on Sunday.  She will     have no Coumadin on Friday or Saturday.   She will have a prothrombin time drawn on Monday and Thursday next week and  those results should be faxed to our office at [phone].  Will have her see  Claude Manges. Whitfield, M.D. in approximately one week in order to progress her  from an orthopedic standpoint.  Will see her back in our office in  approximately one month, certainly sooner if problems would arise.  Otherwise, will defer her management to primary care.                                                Sharlee Blew, N.P.    LC/MEDQ  D:  09/28/2003  T:  09/29/2003  Job:  [id number]   cc:   Claude Manges. Cleophas Dunker, M.D.  [address] [place]  [place] [address]  Fax: [phone]   Dr. [facility]

## 2010-09-26 NOTE — Op Note (Signed)
NAME:  Dominique Perez, Dominique Perez NO.:  [id number]   MEDICAL RECORD NO.:  [id number]                   PATIENT TYPE:  INP   LOCATION:  2899                                 FACILITY:  [facility]   PHYSICIAN:  Claude Manges. Cleophas Dunker, M.D.            DATE OF BIRTH:  [birth year]   DATE OF PROCEDURE:  09/18/2003  DATE OF DISCHARGE:                                 OPERATIVE REPORT   PREOPERATIVE DIAGNOSIS:  End stage osteoarthritis of the right hip.   POSTOPERATIVE DIAGNOSIS:  End stage osteoarthritis of the right hip.   PROCEDURE:  Right total hip replacement.   SURGEON:  Claude Manges. Cleophas Dunker, M.D.   ASSISTANT:  Thereasa Distance A. Chaney Malling, M.D. and Legrand Pitts. Duffy, P.A.-C.   ANESTHESIA:  General orotracheal.   COMPLICATIONS:  None.   COMPONENTS:  DePuy AML 52 mm auto diameter 100 series metallic acetabular  component with an Apex full ruminator and a [employer] Marathon acetabular  liner with a 10 degree lip.  We used a 32 mm outer diameter hip ball with a  +13 neck length and a small stature 12 mm femoral component - Prodigy.   PROCEDURE:  With the patient comfortable on the operating room table and  under general orotracheal anesthesia the nursing staff inserted a Foley  catheter.  The patient was then placed in the lateral decubitus position  with the right side up and secured to the operating room table with the  Innomed hip system.   The right hip was then prepped with Duraprep from the iliac crest to below  the knee; sterile draping was performed.   A routine southern incision was utilized and via sharp dissection carried  down through subcutaneous tissue.  Small bleeders were Bovie coagulated.  Self retaining retractors were inserted.   The iliotibial band was identified and incised along the hip incision.  The  retractors were placed more deeply.  With the hip internally rotated the  short external rotators were identified.  The tendinous structures were  tagged with 0  Ticron suture.  The short external rotators were then incised  with the Bovie.  The capsule was identified and psoas incised along the  femoral neck and head.  There was a small clear effusion.   The hip was then dislocated posteriorly.  There was complete absence of  articular cartilage with a malformed femoral head.  Using the AML guide, the  femoral neck and head were osteotomized.  A retractor was then placed around  the proximal femur.  A starter hole was then made in the piriformis fossa.  Reaming was performed to 11.5 mm to accept a 12 mm prosthesis.  Rasping was  then performed to 12 mm small stature.   The calcar cut was approximately a fingerbreadth proximal to the lesser  trochanter.   The retractor was then placed around the acetabulum.  The labrum was excised  sharply with  a 10 blade.  Reaming was then performed to 51 mm so to accept a  52 mm prosthesis.  There were large osteophytes along the medial aspect of  the acetabulum.  These were removed as they were causing impingement.   We trialed several acetabuli and felt that the standard 10 degree lip was  the most stable with a 100 series acetabular component.  We trialed several  neck lengths and head sizes and felt that the 32 mm rod diameter with a +13  leveled the leg lengths as she was approximately a quarter of an inch short  preoperatively and gave [place] the most stability.  The Prodigy with increased  15 degrees anteversion provided more stability than the standard AML neck.   The trial components were then removed.  The wound was copiously irrigated  with antibiotic and saline solution.   The Apex full ruminator was inserted followed by the [facility] Marathon  liner.  We had a very nice purchase of the acetabulum as well as the liner  without any toggling or motion.   The AML femoral Prodigy component was then impacted flush on the calcar.  The +13 neck length, 32 mm hip ball was then applied after drying the  Morris  taper neck.  The entire construct was then reduced as we placed the hip  through a range of motion and there was no instability with flexion,  extension, external or internal rotation.   The wound was again irrigated with antibiotic and saline solution. The  capsule was closed anatomically with #1 Ethibond.  The short external  rotators were closed with the same material.  The iliotibial band was closed  with a running 0 Vicryl, the subcutaneous closed in several layers with 0  and 2-0 Vicryl and the skin was closed with skin clips.  0.25% Marcaine  without epinephrine was injected into the wound edges.  A sterile bulky  dressing was applied followed by a knee immobilizer.   The patient was then placed supine on the operating room stretcher.  Leg  lengths appeared to be symmetrical.   The patient tolerated the procedure without complications.                                               Claude Manges. Cleophas Dunker, M.D.    PWW/MEDQ  D:  09/18/2003  T:  09/18/2003  Job:  [id number]

## 2010-09-26 NOTE — Cardiovascular Report (Signed)
[place]. [facility]  Patient:    Dominique Perez, Dominique Perez                        MRN: [id number] Proc. Date: 08/25/00 Adm. Date:  [id number] Disc. Date: [id number] Attending:  Eleanora Neighbor                        Cardiac Catheterization  PROCEDURE:  Left heart catheterization with selective coronary angiography, left ventricular angiography.  INDICATIONS:  Ms. Navejas had previous angioplasty of the right coronary artery in May 1999.  She had residual disease in the first diagonal vessel.  We elected to follow her along medically.  She had abnormal exercise test six months ago and elected to continue following her medically.  She has had some residual angina recently and because of that and because of progression of disease elsewhere she is referred for catheterization.  TYPE AND SITE OF ENTRY:  Percutaneous right femoral artery.  CATHETERS:  A 6 French 4 curved Judkins right and left coronary catheters, 6 French pigtail ventriculographic catheter.  CONTRAST MATERIAL:  Omnipaque.  MEDICATIONS GIVEN PRIOR TO THE PROCEDURE:  Valium 10 mg p.o., Benadryl and Solu-Medrol.  MEDICATIONS GIVEN DURING THE PROCEDURE:  Versed 2 mg IV, Ancef 1 g IV.  COMMENTS:  The patient did well.  We used Perclose for closure.  HEMODYNAMIC DATA:  The aortic pressure was 154/79.  The LV was 154/19.  There was no  aortic valve gradient noted on pullback.  ANGIOGRAPHIC DATA:  The left ventricular angiogram was performed in the RAO position.  Overall cardiac size and silhouette are normal.  Left ventricular function is normal with an ejection fraction of 60%.  There is no mitral regurgitation, intracardiac calcification or filling defect.  Right coronary artery:  The right coronary artery is large dominant vessel. The stents are widely patent.  There is no evidence of any restenosis of those vessels.  Left main:  The left main coronary artery is normal.  Left anterior  descending:  The left anterior descending has a 50% narrowing proximally.  There are two high diagonal vessels.  The second vessel has approximately a 50% narrowing.  The first diagonal vessel has a 95% focal stenosis and is a reasonably long vessel but about 1.5 mm in diameter.  It arises at a location of approximately a 50% plaque and it would appear that angioplasty of this vessel would probably result in some compromise in the left anterior descending.  The left anterior descending continues across the apex and although somewhat tortuous is somewhat free of significant disease elsewhere.  There is calcification in the proximal left anterior descending.  Left circumflex:  The left circumflex is normal.  It is small.  OVERALL IMPRESSION: 1. Normal left ventricular function. 2. Persistent patency of stent to the right coronary artery. 3. Moderate (50%) stenosis of the proximal left anterior descending with    severe disease in the small first diagonal vessel arising in its    location with 50% plaque with mild to moderate disease in the proximal    portions of a second diagonal (approximately 50%); with minimal disease in    a small left circumflex.  DISCUSSION:  It is felt that it would probably be best to manage Avri medically.  She may have some residual angina but should be of relatively low risk.  The benefit of fixing this small diagonal vessel does not  warrant the risk of compromise of the proximal left anterior descending. DD:  08/25/00 TD:  08/25/00 Job: [id number]

## 2010-09-26 NOTE — H&P (Signed)
NAME:  Dominique Perez, LINEMAN NO.:  [id number]   MEDICAL RECORD NO.:  [id number]          PATIENT TYPE:  OIB   LOCATION:  2899                         FACILITY:  [facility]   PHYSICIAN:  Colleen Can. Deborah Chalk, M.D.DATE OF BIRTH:  1927-04-08   DATE OF ADMISSION:  10/16/2005  DATE OF DISCHARGE:                                HISTORY & PHYSICAL   [name] comes in today for evaluation of chest pain that has been awakening her  at night over the past week.  Approximately 1 week ago she had injection of  steroids into her low back by Dr. Jeral Fruit and since that time she has had  increasing weakness and fatigue.  She has had weakness, decreased appetite,  some diarrhea changes, but over the last several nights she has been having  a feeling of a swallowing discomfort and a fullness in her chest.  She took  old nitroglycerin with relief but has done this several times and it has  been recurrent and similar to her previous anginal-type discomfort.  She was  seen in early May 2007 and it was felt that these symptoms that she had were  related to the emotional stress of caring for her husband with dementia.  She does have a history of hyperlipidemia and generalized arthritis.   Her past medical history is such that in 2002 she had a catheterization  which showed normal left ventricular function, persistent patency of the  stent to the right coronary artery.  There was 50% stenosis in a proximal  left anterior descending coronary artery and severe disease in the small  first diagonal vessel with 50% narrowing in the second diagonal.  She had a  stent placed in the right coronary artery in 1999.   ALLERGIES:  Include:  1.  ERYTHROMYCIN.  2.  SULFA DRUGS.   CURRENT MEDICINES:  1.  Zetia 10 mg per day.  2.  Metoprolol 50 mg b.i.d.  3.  Prevacid 30 mg a day.  4.  Ecotrin one per day.  5.  Avalide 50/12.5 one daily.  6.  Imdur 60 mg a day.  7.  Nitroglycerin.  8.  Hydrocodone.   FAMILY  HISTORY:  Noncontributory.   SOCIAL HISTORY:  She is married.  Her husband has progressive dementia.   REVIEW OF SYSTEMS:  Mainly remarkable for the back and leg discomfort  related to her osteoarthritis.  She is under a lot of stress with the care  of her husband.   EXAMINATION:  VITAL SIGNS:  Weight is 165, blood pressure 90/60 sitting and  standing, heart rate is 80 and regular.  HEENT:  Negative.  NECK:  Supple.  LUNGS:  Clear.  HEART:  Shows a regular rate and rhythm.  ABDOMEN:  Soft, nontender.  PERIPHERAL PULSES:  Satisfactory.  EXTREMITIES:  Without edema.   EKG shows PVCs and minor ST and T wave changes.   Chest x-ray is unremarkable.   OVERALL IMPRESSION:  1.  Recurrent angina.  2.  Known coronary atherosclerosis with patent stents in the right coronary      artery  at time of previous catheterization.  3.  Osteoarthritis.  4.  Hyperlipidemia.  5.  Hypertension.   PLAN:  Will proceed on with catheterization.  Procedure risks and benefits  have been explained.  Risks including heart attack, stroke, heart stoppage,  death, allergy, and blind bleeding have all been explained and the patient  is willing to proceed.      Colleen Can. Deborah Chalk, M.D.  Electronically Signed     SNT/MEDQ  D:  10/16/2005  T:  10/16/2005  Job:  [id number]

## 2010-09-26 NOTE — H&P (Signed)
[place]. [facility]  Patient:    Dominique Perez, Dominique Perez                          MRN: [id number] Adm. Date:  08/25/00 Attending:  Colleen Can. Deborah Chalk, M.D. Dictator:   Jennet Maduro. Earl Gala, R.N., A.N.P. CC:         Dr. Arlyce Dice at [facility]   History and Physical  CHIEF COMPLAINT:  Palpitations as well as previous episode of heaviness in the arms.  HISTORY OF PRESENT ILLNESS:  [name] is a very pleasant 77 year old white female who has known atherosclerotic cardiovascular disease.  She had stents placed to the right coronary dating back to May 1999.  She had her last nuclear study in October 2001 which revealed a very small discrete lateral perfusion defect with redistribution at rest.  Those findings were felt to be consistent with catheterization dating back to May 1999 and was felt that this was related to the disease in the diagonal vessel which was elected to be treated medically.  She presented to our office earlier this month for her follow-up visit.  It was basically a one-year check.  She was complaining of some sinusitis and was treated with a short course of antibiotics.  From a cardiac standpoint she has had some episodes of bilateral arm heaviness at least on two occasions over the last month, as well as episodes of palpitations that perhaps may be worsening.  She has had no presyncope or syncope reported.  She has had no frank chest discomfort or choking-like sensation, which she had dating back to 1999.  She is now referred for repeat and elective cardiac catheterization.  PAST MEDICAL HISTORY: 1. ASCVD status post angioplasty and stent placement to the right coronary in    May 1999.  She did suffer post procedure groin hematoma and subsequently    had to undergo repair by Dr. Edwyna Shell.  She did have known residual stenosis    of the diagonal vessel. 2. Hypercholesterolemia, on Lipitor. 3. Osteoarthritis. 4. Known chololithiasis. 5.  Hypertensive heart disease.  ALLERGIES:  ERYTHROMYCIN and SULFA.  CURRENT MEDICATIONS: 1. Lipitor 10 mg a day. 2. Avalide 150/12.5 daily. 3. Multivitamin daily. 4. Ecotrin daily. 5. Prevacid 30 mg a day.  FAMILY HISTORY:  Noncontributory.  SOCIAL HISTORY:  She is married.  Her husband is suffering from memory loss. There is no alcohol, no tobacco.  REVIEW OF SYSTEMS:  Is basically as noted above.  She has had no frank chest pain, no neurological symptoms.  Her sinusitis responded well to antibiotic therapy.  She has had no fever, no chills.  She continues to suffer considerable back and leg discomfort due to osteoarthritis.  She has had no edema.  She has had no shortness of breath.  PHYSICAL EXAMINATION:  GENERAL:  She is a very pleasant elderly female who appears younger than her stated age.  VITAL SIGNS:  Blood pressure 110/60 sitting, 130/80 standing.  Heart rate 76, respirations 18.  She is afebrile.  Weight is 160 pounds.  SKIN:  Warm and dry, color unremarkable.  NECK:  Supple.  LUNGS:  Clear.  HEART:  Shows a regular rhythm.  ABDOMEN:  Soft with positive bowel sounds.  EXTREMITIES:  Reveal no evidence of edema.  There is an operative scar noted in the right groin.  Left femoral pulse is +2.  NEUROLOGIC:  Intact with no gross focal deficits.  LABORATORY DATA:  Currently  pending.  IMPRESSION: 1. Episodes of bilateral arm heaviness with questionable etiology as well as    worsening palpitations in the setting of known atherosclerotic    cardiovascular disease with previous revascularization in May 1999 and    known residual disease in a diagonal vessel. 2. Hypercholesterolemia. 3. Significant osteoarthritis. 4. Hypertension, currently controlled.  PLAN:  Will proceed on with elective cardiac catheterization.  The risks, procedure, and benefits have been explained and she is willing to proceed. DD:  08/24/00 TD:  08/24/00 Job: [id number]

## 2010-09-26 NOTE — Discharge Summary (Signed)
NAME:  Dominique Perez, Dominique Perez                           ACCOUNT NO.:  [id number]   MEDICAL RECORD NO.:  [id number]                   PATIENT TYPE:  IPS   LOCATION:  4142                                 FACILITY:  [facility]   PHYSICIAN:  Ranelle Oyster, M.D.             DATE OF BIRTH:  10/05/1926   DATE OF ADMISSION:  09/21/2003  DATE OF DISCHARGE:  09/22/2003                                 DISCHARGE SUMMARY   DISCHARGE DIAGNOSES:  1. Right total hip arthroplasty.  2. History of deep vein thrombosis.  3. History of hypertension.  4. Elevated cholesterol.  5. Anemia.  6. Lethargy and confusion.  7. Cardiac chest pain.   HISTORY OF PRESENT ILLNESS:  The patient is a 77 year old white female with  history of right total knee arthroplasty, melena, and hypertension admitted  on May 10 with chronic right hip pain secondary to osteoarthritis.  The  patient underwent a right total hip arthroplasty on May 10, Dr. Cleophas Dunker.  Placed on Coumadin for DVT prophylaxis.  Postoperative complications include  anemia, status post transfusion, hallucinations, and fevers.  PT report at  this time indicates patient is bed mobility minimum assistance, transfers  minimum assistance, ambulating 10 feet with standard walker minimum  assistance.  Left knee pain, left hip pain hindered therapy, and sedation.  The patient was transferred to [facility] Rehabilitation Department  on Sep 21, 2003.   PAST MEDICAL HISTORY:  1. As above.  2. CAD.  3. GERD.  4. Elevated cholesterol.   PAST SURGICAL HISTORY:  1. Right total knee arthroplasty.  2. Tibial arthrotomy.  3. Tonsillectomy.  4. As above.   MEDICATIONS PRIOR TO ADMISSION:  1. Avelox 150/12.5 daily.  2. Metoprolol 50 mg b.i.d.  3. Ecotrin 325 mg daily.  4. Centrum daily.  5. Vitorin daily.  6. Prevacid.   PRIMARY CARE PHYSICIAN:  Dr. Arlyce Dice.  Cardiologist is [employer]. Deborah Chalk,  M.D.   SOCIAL HISTORY:  The patient lives with husband in  [place], one-level home,  step to entry, independent prior to admission.  Husband has dementia, needs  assistance with supervision.  Family in agreement for check if needed.   HOSPITAL COURSE:  [name] was admitted to [facility] on Sep 21, 2003, for comprehensive inpatient rehabilitation  where she received more than 3 hours daily.  Dominique Perez' stay in rehab was  shortened by episode of chest pain and anxiety.  EKG was performed on Sep 22, 2003, which revealed R wave progression unchanged, significant throat  pain.  The patient, due to anxiety and significant chest pain, was  transferred back to telemetry the next day, Sep 22, 2003.  The patient did  not undergo any therapies.  She was transferred back to cardiology services  for telemetry.  Blood pressure medicine was held.  She received Maalox and  Tums with very little relief.  On Sep 22, 2003, she was transferred back to  telemetry.  She had a cardiac exam performed on the following Monday.      Drucilla Schmidt, P.A.                         Ranelle Oyster, M.D.    LB/MEDQ  D:  11/05/2003  T:  11/06/2003  Job:  [id number]   cc:   Lubertha Basque. Jerl Santos, M.D.  [address]  [place]  [place] [address]  Fax: [phone]

## 2011-01-01 ENCOUNTER — Encounter: Payer: Self-pay | Admitting: *Deleted

## 2011-01-01 DIAGNOSIS — R251 Tremor, unspecified: Secondary | ICD-10-CM | POA: Insufficient documentation

## 2011-01-01 DIAGNOSIS — M1990 Unspecified osteoarthritis, unspecified site: Secondary | ICD-10-CM | POA: Insufficient documentation

## 2011-01-01 DIAGNOSIS — M79604 Pain in right leg: Secondary | ICD-10-CM | POA: Insufficient documentation

## 2011-01-01 DIAGNOSIS — K8020 Calculus of gallbladder without cholecystitis without obstruction: Secondary | ICD-10-CM | POA: Insufficient documentation

## 2011-01-01 DIAGNOSIS — E785 Hyperlipidemia, unspecified: Secondary | ICD-10-CM | POA: Insufficient documentation

## 2011-01-01 DIAGNOSIS — K219 Gastro-esophageal reflux disease without esophagitis: Secondary | ICD-10-CM | POA: Insufficient documentation

## 2011-01-07 ENCOUNTER — Encounter: Payer: Self-pay | Admitting: Cardiology

## 2011-01-07 ENCOUNTER — Other Ambulatory Visit: Payer: Self-pay | Admitting: Cardiology

## 2011-01-07 ENCOUNTER — Other Ambulatory Visit: Payer: Medicare Other | Admitting: *Deleted

## 2011-01-07 ENCOUNTER — Ambulatory Visit (INDEPENDENT_AMBULATORY_CARE_PROVIDER_SITE_OTHER): Payer: Medicare Other | Admitting: Cardiology

## 2011-01-07 DIAGNOSIS — I4891 Unspecified atrial fibrillation: Secondary | ICD-10-CM

## 2011-01-07 DIAGNOSIS — I2510 Atherosclerotic heart disease of native coronary artery without angina pectoris: Secondary | ICD-10-CM

## 2011-01-07 DIAGNOSIS — Z8679 Personal history of other diseases of the circulatory system: Secondary | ICD-10-CM

## 2011-01-07 DIAGNOSIS — E785 Hyperlipidemia, unspecified: Secondary | ICD-10-CM

## 2011-01-07 DIAGNOSIS — R05 Cough: Secondary | ICD-10-CM

## 2011-01-07 DIAGNOSIS — I480 Paroxysmal atrial fibrillation: Secondary | ICD-10-CM | POA: Insufficient documentation

## 2011-01-07 DIAGNOSIS — I10 Essential (primary) hypertension: Secondary | ICD-10-CM | POA: Insufficient documentation

## 2011-01-07 NOTE — Patient Instructions (Signed)
Take your Multaq with food.  Continue your medications.  I will see you again in 6 months with fasting lab.

## 2011-01-07 NOTE — Assessment & Plan Note (Signed)
She is asymptomatic. I have recommended continued on aspirin therapy. She is also on statin therapy and a beta blocker. I will plan on followup again in 6 months and we'll check fasting lab work at that time.

## 2011-01-07 NOTE — Progress Notes (Signed)
Dominique Perez Date of Birth: 09-29-1926   History of Present Illness: Dominique Perez is seen today to establish cardiac care. She is a former patient of Dr. Maylon Cos. She has a history of coronary disease. She is status post stenting of the right coronary in 1999 with a 3.5 x 13 and 3.0 x 13 Duet stent. She underwent stenting of the diagonal branch in [place] 2007 with a 2.5 x 23 mm Cypher stent. She has done very well since that time and denies any recurrent chest pain or shortness of breath. She also has a history of atrial fibrillation. She has been intolerant of amiodarone in the past because of severe tremor. She has been on Multaq since January of 2010 with good control of her arrhythmia. She has not been on Coumadin.  Current Outpatient Prescriptions on File Prior to Visit  Medication Sig Dispense Refill  . aspirin 325 MG tablet Take 325 mg by mouth daily.        Marland Kitchen dronedarone (MULTAQ) 400 MG tablet Take 400 mg by mouth 2 (two) times daily with a meal.        . isosorbide mononitrate (IMDUR) 60 MG 24 hr tablet Take 60 mg by mouth daily.        . lansoprazole (PREVACID) 30 MG capsule Take 30 mg by mouth daily.        . metoprolol (LOPRESSOR) 50 MG tablet Take 50 mg by mouth 3 (three) times daily.        . Multiple Vitamin (MULTIVITAMIN) tablet Take 1 tablet by mouth daily.        . simvastatin (ZOCOR) 20 MG tablet Take 20 mg by mouth at bedtime. Taking 1/2 daily       . traMADol-acetaminophen (ULTRACET) 37.5-325 MG per tablet Take 1 tablet by mouth every 6 (six) hours as needed.          Allergies  Allergen Reactions  . Amiodarone     tremor  . Erythromycin Stearate     REACTION: fever  . Ferrous Sulfate     REACTION: fever  . Iohexol      Code: HIVES, Desc: Pt states that 10 yrs ago she IV contrast for a CT and broke out in hives and experienced SOB.  Done w/o iv contrast today., Onset Date: [id number]     Past Medical History  Diagnosis Date  . Coronary artery disease     s/p  pci 1999 rca, 2007 diagonal  . Hypertension   . Tremor     chronic  . Leg pain, right     chronic  . Arthritis   . GERD (gastroesophageal reflux disease)   . Hyperlipidemia   . Gallstones   . Atrial fibrillation   . Fracture of right femur     Past Surgical History  Procedure Date  . Knee surgery     right, titanium rod in the right femur  . Femoral pseudoaneurysm 1999    repair  . Cardiac catheterization 1999     PCI to RCA  . Cardiac catheterization 2007    PCI to diagonal  . Cardiac catheterization 2002    treated medically  . Total hip arthroplasty     Dr. Cleophas Dunker  . Cataract extraction   . Left knee 2009    replacement by Dr. Lequita Halt    History  Smoking status  . Never Smoker   Smokeless tobacco  . Not on file    History  Alcohol Use  Family History  Problem Relation Age of Onset  . Heart attack Mother   . Cancer Father     Review of Systems: The review of systems is positive for severe arthritis.  She reports that she fractured her right femur 2 years ago and that since then she's had to walk with a walker. Occasionally she does cough up green sputum but has had no wheezing, shortness of breath, or fever. She denies any postnasal drip.All other systems were reviewed and are negative.  Physical Exam: BP 138/80  Pulse 56  Ht 5\' 6"  (1.676 m)  Wt 167 lb (75.751 kg)  BMI 26.95 kg/m2 She is a pleasant elderly white female in no acute distress. She is normocephalic, atraumatic. Pupils are equal round and reactive to light and accommodation. Sclera are clear. Oropharynx is clear. Neck is supple without JVD, adenopathy, thyromegaly, or bruits. Lungs are clear. Cardiac exam reveals a regular rate and rhythm without gallop, murmur, or click. Abdomen is soft and nontender. She has no hepatosplenomegaly, masses, or bruits. She has no edema. Pedal pulses are palpable. Skin is warm and dry. She is alert and oriented x3. She does walk with a walker. Cranial  nerves II through XII are intact. LABORATORY DATA: Her last ECG in November of 2011 showed sinus bradycardia with a normal ECG. Her most recent blood work in February 2012 showed a normal CBC, chemistry panel, and TSH.  Assessment / Plan:

## 2011-01-07 NOTE — Assessment & Plan Note (Signed)
Well controlled on Multaq. Since she has had no recurrence over the past 2 years we will forego anticoagulation therapy at this time.

## 2011-01-28 ENCOUNTER — Telehealth: Payer: Self-pay | Admitting: Nurse Practitioner

## 2011-01-28 ENCOUNTER — Encounter (HOSPITAL_BASED_OUTPATIENT_CLINIC_OR_DEPARTMENT_OTHER): Payer: Self-pay | Admitting: *Deleted

## 2011-01-28 ENCOUNTER — Emergency Department (HOSPITAL_BASED_OUTPATIENT_CLINIC_OR_DEPARTMENT_OTHER)
Admission: EM | Admit: 2011-01-28 | Discharge: 2011-01-29 | Disposition: A | Discharge status: Home / Self Care | Payer: Medicare Other | Source: Home / Self Care | Attending: Emergency Medicine | Admitting: Emergency Medicine

## 2011-01-28 ENCOUNTER — Emergency Department (INDEPENDENT_AMBULATORY_CARE_PROVIDER_SITE_OTHER): Payer: Medicare Other

## 2011-01-28 ENCOUNTER — Other Ambulatory Visit: Payer: Self-pay

## 2011-01-28 DIAGNOSIS — I10 Essential (primary) hypertension: Secondary | ICD-10-CM | POA: Insufficient documentation

## 2011-01-28 DIAGNOSIS — R42 Dizziness and giddiness: Secondary | ICD-10-CM | POA: Insufficient documentation

## 2011-01-28 DIAGNOSIS — I2510 Atherosclerotic heart disease of native coronary artery without angina pectoris: Secondary | ICD-10-CM | POA: Insufficient documentation

## 2011-01-28 DIAGNOSIS — R55 Syncope and collapse: Secondary | ICD-10-CM | POA: Insufficient documentation

## 2011-01-28 DIAGNOSIS — J189 Pneumonia, unspecified organism: Secondary | ICD-10-CM | POA: Insufficient documentation

## 2011-01-28 DIAGNOSIS — I517 Cardiomegaly: Secondary | ICD-10-CM

## 2011-01-28 DIAGNOSIS — R002 Palpitations: Secondary | ICD-10-CM

## 2011-01-28 DIAGNOSIS — I4891 Unspecified atrial fibrillation: Secondary | ICD-10-CM | POA: Insufficient documentation

## 2011-01-28 LAB — COMPREHENSIVE METABOLIC PANEL
ALT: 7 U/L (ref 0–35)
AST: 18 U/L (ref 0–37)
CO2: 25 mEq/L (ref 19–32)
Chloride: 104 mEq/L (ref 96–112)
Creatinine, Ser: 0.9 mg/dL (ref 0.50–1.10)
GFR calc Af Amer: >60 mL/min (ref >60)
GFR calc non Af Amer: >60 mL/min (ref >60)
Glucose, Bld: 102 mg/dL — ABNORMAL HIGH (ref 70–99)
Total Bilirubin: 0.2 mg/dL — ABNORMAL LOW (ref 0.3–1.2)

## 2011-01-28 LAB — DIFFERENTIAL
Basophils Absolute: 0 10*3/uL (ref 0.0–0.1)
Eosinophils Relative: 2 % (ref 0–5)
Lymphocytes Relative: 21 % (ref 12–46)
Lymphs Abs: 1.6 10*3/uL (ref 0.7–4.0)
Monocytes Absolute: 0.8 10*3/uL (ref 0.1–1.0)
Neutro Abs: 4.9 10*3/uL (ref 1.7–7.7)

## 2011-01-28 LAB — CBC
HCT: 41.3 % (ref 36.0–46.0)
Hemoglobin: 13.8 g/dL (ref 12.0–15.0)
MCV: 87.7 fL (ref 78.0–100.0)
RBC: 4.71 MIL/uL (ref 3.87–5.11)
RDW: 14.1 % (ref 11.5–15.5)
WBC: 7.5 10*3/uL (ref 4.0–10.5)

## 2011-01-28 LAB — CK TOTAL AND CKMB (NOT AT ARMC): CK, MB: 1.9 ng/mL (ref 0.3–4.0)

## 2011-01-28 NOTE — ED Notes (Signed)
Pt c/o hx afib, c/o palpitations x 3 days.

## 2011-01-28 NOTE — ED Notes (Signed)
Pt states she has intermittent palpitations over last 3 days. Pt reports light headedness and nausea associated with palpitations. Pt denies diaphoresis or chest pain. Pt has apt for AM with cardiology to place monitor. Pt denies any symptoms at this time.

## 2011-01-28 NOTE — Telephone Encounter (Signed)
Pt says she is taking tramadol and it is making her shake. Please call

## 2011-01-28 NOTE — Telephone Encounter (Signed)
Called stating she has been "real shaky" past 3 days. Thinks could be Tramadol but she has been taking that for years. After talking with her sounds like she has been have episodes of Afib. Is taking Metoprolol 50 mg TID. States heart has been racing at times. Lawson Fiscal suggest putting an event monitor on. She will come in tomorrow for placement.

## 2011-01-29 ENCOUNTER — Telehealth: Payer: Self-pay | Admitting: Cardiology

## 2011-01-29 ENCOUNTER — Encounter (HOSPITAL_BASED_OUTPATIENT_CLINIC_OR_DEPARTMENT_OTHER): Payer: Self-pay | Admitting: Emergency Medicine

## 2011-01-29 ENCOUNTER — Inpatient Hospital Stay (HOSPITAL_COMMUNITY)
Admission: AD | Admit: 2011-01-29 | Discharge: 2011-02-01 | DRG: 195 | Disposition: A | Discharge status: Home / Self Care | Payer: Medicare Other | Source: Other Acute Inpatient Hospital | Attending: Internal Medicine | Admitting: Internal Medicine

## 2011-01-29 ENCOUNTER — Encounter: Payer: Medicare Other | Admitting: *Deleted

## 2011-01-29 DIAGNOSIS — R42 Dizziness and giddiness: Secondary | ICD-10-CM | POA: Diagnosis present

## 2011-01-29 DIAGNOSIS — I2510 Atherosclerotic heart disease of native coronary artery without angina pectoris: Secondary | ICD-10-CM | POA: Diagnosis present

## 2011-01-29 DIAGNOSIS — Z96649 Presence of unspecified artificial hip joint: Secondary | ICD-10-CM

## 2011-01-29 DIAGNOSIS — Z9861 Coronary angioplasty status: Secondary | ICD-10-CM

## 2011-01-29 DIAGNOSIS — Z96659 Presence of unspecified artificial knee joint: Secondary | ICD-10-CM

## 2011-01-29 DIAGNOSIS — M1990 Unspecified osteoarthritis, unspecified site: Secondary | ICD-10-CM | POA: Diagnosis present

## 2011-01-29 DIAGNOSIS — I10 Essential (primary) hypertension: Secondary | ICD-10-CM | POA: Diagnosis present

## 2011-01-29 DIAGNOSIS — J189 Pneumonia, unspecified organism: Principal | ICD-10-CM | POA: Diagnosis present

## 2011-01-29 DIAGNOSIS — R002 Palpitations: Secondary | ICD-10-CM

## 2011-01-29 DIAGNOSIS — I4891 Unspecified atrial fibrillation: Secondary | ICD-10-CM | POA: Diagnosis present

## 2011-01-29 DIAGNOSIS — R55 Syncope and collapse: Secondary | ICD-10-CM | POA: Diagnosis present

## 2011-01-29 DIAGNOSIS — Z7982 Long term (current) use of aspirin: Secondary | ICD-10-CM

## 2011-01-29 DIAGNOSIS — R197 Diarrhea, unspecified: Secondary | ICD-10-CM | POA: Diagnosis present

## 2011-01-29 DIAGNOSIS — T364X5A Adverse effect of tetracyclines, initial encounter: Secondary | ICD-10-CM | POA: Diagnosis present

## 2011-01-29 DIAGNOSIS — Z79899 Other long term (current) drug therapy: Secondary | ICD-10-CM

## 2011-01-29 DIAGNOSIS — G8929 Other chronic pain: Secondary | ICD-10-CM | POA: Diagnosis present

## 2011-01-29 DIAGNOSIS — R5381 Other malaise: Secondary | ICD-10-CM | POA: Diagnosis present

## 2011-01-29 DIAGNOSIS — E785 Hyperlipidemia, unspecified: Secondary | ICD-10-CM | POA: Diagnosis present

## 2011-01-29 LAB — DIFFERENTIAL
Eosinophils Relative: 2 % (ref 0–5)
Lymphocytes Relative: 29 % (ref 12–46)
Monocytes Absolute: 0.6 10*3/uL (ref 0.1–1.0)
Monocytes Relative: 9 % (ref 3–12)
Neutro Abs: 3.9 10*3/uL (ref 1.7–7.7)

## 2011-01-29 LAB — CBC
HCT: 41.1 % (ref 36.0–46.0)
Hemoglobin: 13.8 g/dL (ref 12.0–15.0)
MCH: 29.5 pg (ref 26.0–34.0)
MCHC: 33.6 g/dL (ref 30.0–36.0)

## 2011-01-29 LAB — T4, FREE: Free T4: 1 ng/dL (ref 0.80–1.80)

## 2011-01-29 LAB — CARDIAC PANEL(CRET KIN+CKTOT+MB+TROPI)
CK, MB: 2.6 ng/mL (ref 0.3–4.0)
Total CK: 52 U/L (ref 7–177)
Troponin I: <0.3 ng/mL (ref <0.30)
Troponin I: <0.3 ng/mL (ref <0.30)
Troponin I: <0.3 ng/mL (ref <0.30)

## 2011-01-29 LAB — COMPREHENSIVE METABOLIC PANEL
BUN: 9 mg/dL (ref 6–23)
Calcium: 9 mg/dL (ref 8.4–10.5)
Creatinine, Ser: 0.94 mg/dL (ref 0.50–1.10)
GFR calc Af Amer: >60 mL/min (ref >60)
Glucose, Bld: 87 mg/dL (ref 70–99)
Total Protein: 6.8 g/dL (ref 6.0–8.3)

## 2011-01-29 LAB — MAGNESIUM: Magnesium: 2.1 mg/dL (ref 1.5–2.5)

## 2011-01-29 LAB — T3, FREE: T3, Free: 3.3 pg/mL (ref 2.3–4.2)

## 2011-01-29 MED ORDER — DEXTROSE 5 % IV SOLN
1.0000 g | Freq: Once | INTRAVENOUS | Status: AC
  Administered 2011-01-29: 1 g via INTRAVENOUS
  Filled 2011-01-29: qty 1

## 2011-01-29 NOTE — ED Provider Notes (Signed)
History     CSN: [id number] Arrival date & time: 01/28/2011  9:35 PM   Chief Complaint  Patient presents with  . Palpitations  . Dizziness     (Include location/radiation/quality/duration/timing/severity/associated sxs/prior treatment) Patient is a 77 y.o. female presenting with palpitations. The history is provided by the patient. No language interpreter was used.  Palpitations  This is a new problem. The current episode started more than 2 days ago. The problem occurs daily. The problem has been gradually worsening. The problem is associated with beta blockers. Associated symptoms include malaise/fatigue, chest pain, irregular heartbeat, near-syncope, weakness, cough and sputum production. Pertinent negatives include no diaphoresis, no fever, no numbness, no orthopnea, no PND, no nausea, no vomiting, no headaches, no back pain, no leg pain, no lower extremity edema and no hemoptysis. She has tried nothing for the symptoms. The treatment provided no relief. Risk factors include dyslipidemia. Her past medical history is significant for heart disease. Her past medical history does not include anemia or hyperthyroidism.     Past Medical History  Diagnosis Date  . Coronary artery disease     s/p pci 1999 rca, 2007 diagonal  . Hypertension   . Tremor     chronic  . Leg pain, right     chronic  . Arthritis   . GERD (gastroesophageal reflux disease)   . Hyperlipidemia   . Gallstones   . Atrial fibrillation   . Fracture of right femur      Past Surgical History  Procedure Date  . Knee surgery     right, titanium rod in the right femur  . Femoral pseudoaneurysm 1999    repair  . Cardiac catheterization 1999     PCI to RCA  . Cardiac catheterization 2007    PCI to diagonal  . Cardiac catheterization 2002    treated medically  . Total hip arthroplasty     Dr. Cleophas Dunker  . Cataract extraction   . Left knee 2009    replacement by Dr. Lequita Halt    Family History  Problem  Relation Age of Onset  . Heart attack Mother   . Cancer Father     History  Substance Use Topics  . Smoking status: Never Smoker   . Smokeless tobacco: Not on file  . Alcohol Use: No    OB History    Grav Para Term Preterm Abortions TAB SAB Ect Mult Living                  Review of Systems  Constitutional: Positive for malaise/fatigue. Negative for fever and diaphoresis.  HENT: Negative for facial swelling.   Eyes: Negative for discharge.  Respiratory: Positive for cough and sputum production. Negative for hemoptysis.   Cardiovascular: Positive for chest pain, palpitations and near-syncope. Negative for orthopnea and PND.  Gastrointestinal: Negative.  Negative for nausea and vomiting.  Genitourinary: Negative for difficulty urinating.  Musculoskeletal: Negative for back pain.  Neurological: Positive for weakness. Negative for seizures, numbness and headaches.  Hematological: Negative.   Psychiatric/Behavioral: Negative.     Allergies  Amiodarone; Erythromycin stearate; Ferrous sulfate; and Iohexol  Home Medications   Current Outpatient Rx  Name Route Sig Dispense Refill  . ASPIRIN 325 MG PO TABS Oral Take 325 mg by mouth daily.      Marland Kitchen DRONEDARONE HCL 400 MG PO TABS Oral Take 400 mg by mouth 2 (two) times daily with a meal.      . ISOSORBIDE MONONITRATE CR 60 MG PO TB24  Oral Take 60 mg by mouth daily.      Marland Kitchen METOPROLOL TARTRATE 50 MG PO TABS Oral Take 50 mg by mouth 3 (three) times daily.      Marland Kitchen ONE-DAILY MULTI VITAMINS PO TABS Oral Take 1 tablet by mouth daily.      Marland Kitchen SIMVASTATIN 20 MG PO TABS Oral Take 20 mg by mouth at bedtime. Taking 1/2 daily     . TRAMADOL-ACETAMINOPHEN 37.5-325 MG PO TABS Oral Take 1 tablet by mouth every 6 (six) hours as needed.      Marland Kitchen LANSOPRAZOLE 30 MG PO CPDR Oral Take 30 mg by mouth daily.        Physical Exam    BP 161/61  Pulse 54  Temp(Src) 98.3 F (36.8 C) (Oral)  Resp 18  Ht 5\' 6"  (1.676 m)  Wt 160 lb (72.576 kg)  BMI 25.82  kg/m2  SpO2 96%  Physical Exam  Constitutional: She is oriented to person, place, and time. She appears well-developed and well-nourished.  HENT:  Head: Normocephalic and atraumatic.  Mouth/Throat: No oropharyngeal exudate.  Eyes: EOM are normal. Pupils are equal, round, and reactive to light.  Neck: Normal range of motion. Neck supple. No JVD present.  Cardiovascular: Normal rate and regular rhythm.   Murmur heard. Pulmonary/Chest: Effort normal and breath sounds normal. No stridor. No respiratory distress. She has no rales.  Abdominal: Soft. Bowel sounds are normal. She exhibits no distension.  Musculoskeletal: Normal range of motion. She exhibits no edema.  Lymphadenopathy:    She has no cervical adenopathy.  Neurological: She is alert and oriented to person, place, and time.  Skin: Skin is warm and dry.  Psychiatric: She has a normal mood and affect.    ED Course  Procedures  Results for orders placed during the hospital encounter of 01/28/11  CBC      Component Value Range   WBC 7.5  4.0 - 10.5 (K/uL)   RBC 4.71  3.87 - 5.11 (MIL/uL)   Hemoglobin 13.8  12.0 - 15.0 (g/dL)   HCT 91.4  78.2 - 95.6 (%)   MCV 87.7  78.0 - 100.0 (fL)   MCH 29.3  26.0 - 34.0 (pg)   MCHC 33.4  30.0 - 36.0 (g/dL)   RDW 21.3  08.6 - 57.8 (%)   Platelets 160  150 - 400 (K/uL)  DIFFERENTIAL      Component Value Range   Neutrophils Relative 66  43 - 77 (%)   Neutro Abs 4.9  1.7 - 7.7 (K/uL)   Lymphocytes Relative 21  12 - 46 (%)   Lymphs Abs 1.6  0.7 - 4.0 (K/uL)   Monocytes Relative 11  3 - 12 (%)   Monocytes Absolute 0.8  0.1 - 1.0 (K/uL)   Eosinophils Relative 2  0 - 5 (%)   Eosinophils Absolute 0.2  0.0 - 0.7 (K/uL)   Basophils Relative 0  0 - 1 (%)   Basophils Absolute 0.0  0.0 - 0.1 (K/uL)  COMPREHENSIVE METABOLIC PANEL      Component Value Range   Sodium 140  135 - 145 (mEq/L)   Potassium 4.2  3.5 - 5.1 (mEq/L)   Chloride 104  96 - 112 (mEq/L)   CO2 25  19 - 32 (mEq/L)   Glucose,  Bld 102 (*) 70 - 99 (mg/dL)   BUN 11  6 - 23 (mg/dL)   Creatinine, Ser 4.69  0.50 - 1.10 (mg/dL)   Calcium 9.0  8.4 -  10.5 (mg/dL)   Total Protein 7.0  6.0 - 8.3 (g/dL)   Albumin 3.5  3.5 - 5.2 (g/dL)   AST 18  0 - 37 (U/L)   ALT 7  0 - 35 (U/L)   Alkaline Phosphatase 80  39 - 117 (U/L)   Total Bilirubin 0.2 (*) 0.3 - 1.2 (mg/dL)   GFR calc non Af Amer >60  >60 (mL/min)   GFR calc Af Amer >60  >60 (mL/min)  CK TOTAL AND CKMB      Component Value Range   Total CK 54  7 - 177 (U/L)   CK, MB 1.9  0.3 - 4.0 (ng/mL)   Relative Index RELATIVE INDEX IS INVALID  0.0 - 2.5   TROPONIN I      Component Value Range   Troponin I <0.30  <0.30 (ng/mL)   Dg Chest 2 View  01/29/2011  *RADIOLOGY REPORT*  Clinical Data: 77 year old female with syncope, dizziness and heart palpitations.  CHEST - 2 VIEW  Comparison: 11/04/2008  Findings: Mild cardiomegaly is again identified. Mild lingular airspace opacities are suspicious for pneumonia. There is no evidence of pleural effusion or pneumothorax. No acute bony abnormalities are present.  IMPRESSION: Mild lingular airspace opacity compatible with pneumonia.  Original Report Authenticated By: Rosendo Gros, M.D.     1. Community acquired pneumonia   2. Palpitation   3. Near syncope      MDM  Date: 01/29/2011  Rate: 60  Rhythm: normal sinus rhythm  QRS Axis: normal  Intervals: normal  ST/T Wave abnormalities: nonspecific ST changes  Conduction Disutrbances:none  Narrative Interpretation:   Old EKG Reviewed: unchanged        Freda Jaquith K Mailyn Steichen-Rasch, MD 01/29/11 [phone]

## 2011-01-29 NOTE — Telephone Encounter (Signed)
Noted. Was coming in for monitor for palp.

## 2011-01-29 NOTE — Telephone Encounter (Signed)
Pt called said she was in hospital wont be coming in today

## 2011-01-31 DIAGNOSIS — I4891 Unspecified atrial fibrillation: Secondary | ICD-10-CM

## 2011-02-01 LAB — BASIC METABOLIC PANEL
BUN: 12 mg/dL (ref 6–23)
Chloride: 105 mEq/L (ref 96–112)
Creatinine, Ser: 0.95 mg/dL (ref 0.50–1.10)
GFR calc Af Amer: >60 mL/min (ref >60)

## 2011-02-05 ENCOUNTER — Encounter: Payer: Self-pay | Admitting: Family Medicine

## 2011-02-05 ENCOUNTER — Ambulatory Visit (INDEPENDENT_AMBULATORY_CARE_PROVIDER_SITE_OTHER): Payer: Medicare Other | Admitting: Family Medicine

## 2011-02-05 DIAGNOSIS — R251 Tremor, unspecified: Secondary | ICD-10-CM

## 2011-02-05 DIAGNOSIS — R259 Unspecified abnormal involuntary movements: Secondary | ICD-10-CM

## 2011-02-05 DIAGNOSIS — I10 Essential (primary) hypertension: Secondary | ICD-10-CM

## 2011-02-05 DIAGNOSIS — J189 Pneumonia, unspecified organism: Secondary | ICD-10-CM

## 2011-02-05 DIAGNOSIS — R197 Diarrhea, unspecified: Secondary | ICD-10-CM

## 2011-02-05 NOTE — Progress Notes (Signed)
  Subjective:    Patient ID: Dominique Perez, female    DOB: 04/10/1927, 77 y.o.   MRN: [id number]  HPI Hospital followup. Patient has chronic problems of CAD, GERD, essential tremor, hyperlipidemia, hypertension, atrial fibrillation. She was admitted to the hospital 01/29/2011 with possible community-acquired pneumonia. Location was lingular. She had normal white blood cell count on admission. She was on IV antibiotics and discharged on Ceftin. She developed some diarrhea and stopped the antibiotic about 2 days ago. He has no cough at this time and no fever. She feels near baseline. Diarrhea slowly improving. No nausea or vomiting. No smoking history. No significant dyspnea with exertion. Denies chest pain. Flu vaccine earlier this year.  Essential tremor. metoprolol reduced to one half tablet 3 times a day during hospital stay and her tremor has worsened some since then.   Review of Systems  Constitutional: Negative for fever, chills and fatigue.  Respiratory: Negative for cough, shortness of breath and wheezing.   Cardiovascular: Negative for chest pain, palpitations and leg swelling.  Gastrointestinal: Negative for nausea, vomiting and abdominal pain.  Genitourinary: Negative for dysuria.  Neurological: Negative for dizziness and syncope.       Objective:   Physical Exam  Constitutional: She is oriented to person, place, and time. She appears well-developed and well-nourished.  HENT:  Mouth/Throat: Oropharynx is clear and moist.  Neck: Neck supple.  Cardiovascular: Normal rate and regular rhythm.   Pulmonary/Chest: Effort normal and breath sounds normal. No respiratory distress. She has no wheezes. She has no rales.  Musculoskeletal: She exhibits no edema.  Lymphadenopathy:    She has no cervical adenopathy.  Neurological: She is alert and oriented to person, place, and time.          Assessment & Plan:  #1 community-acquired pneumonia, lingular. Clinically improved.  #2  diarrhea possibly related antibiotic. Symptoms are slowly improving. If persist testing for C. difficile. #3 essential tremor  #4 history of CAD with no recent symptoms

## 2011-02-05 NOTE — Patient Instructions (Signed)
Follow up promptly for any fever or worsening cough Be in touch by next week if any diarrhea persists.

## 2011-02-08 NOTE — H&P (Signed)
NAME:  Dominique Perez, Dominique Perez NO.:  [id number]  MEDICAL RECORD NO.:  [id number]  LOCATION:  2037                         FACILITY:  [facility]  PHYSICIAN:  Eduard Clos, MDDATE OF BIRTH:  28-Oct-1926  DATE OF ADMISSION:  01/29/2011 DATE OF DISCHARGE:                             HISTORY & PHYSICAL   PRIMARY CARE PHYSICIAN:  Evelena Peat, MD  PRIMARY CARDIOLOGIST:  Peter M. [place]il[place], MD  CHIEF COMPLAINT:  Palpitations, cough, and phlegm.  HISTORY OF PRESENTING ILLNESS:  An 77 year old female with history of atrial fibrillation; CAD, status post stenting; hypertension; hyperlipidemia; has been experiencing palpitations off and on for the last 3 days.  Along with that, the patient also has been having some cough and productive sputum.  The sputum is greenish in color.  Has some subjective feeling of fever and chills.  In the ER, the patient was found to have EKG with sinus rhythm and monitor was showing somebigeminy.  Chest x-ray was showing lingular pneumonia.  The patient has been admitted for further workup.  The ER physician, Dr. Nicanor Alcon had discussed with the cardiologist about the bigeminy.  They have just advised hospital admission.  The patient denies any nausea, vomiting, abdominal pain, dysuria, discharge, diarrhea.  Denies any dizziness, loss of consciousness, any focal deficits.  Denies any shortness of breath.  The patient usually walks with a walker as the patient has some difficulty in the right lower extremity after the surgery with nonhealing fractures.  PAST MEDICAL HISTORY: 1. Hypertension. 2. CAD, status post stenting. 3. Atrial fibrillation. 4. Hyperlipidemia.  PAST SURGICAL HISTORY: 1. Has had bilateral hip surgery. 2. Bilateral knee surgery. 3. Stenting for the coronary artery disease.  MEDICATIONS PRIOR TO ADMISSION: 1. Multaq 400 mg twice daily. 2. Imdur 60 mg daily. 3. Metoprolol 50 mg twice daily. 4. Simvastatin 20 mg daily. 5.  Aspirin 325 daily. 6. Tylenol 500 mg twice daily. 7. Centrum Silver.  ALLERGIES:  SULFA, ERYTHROMYCIN, IODINE, and AMIODARONE.  SOCIAL HISTORY:  The patient denies smoking cigarettes, drinking alcohol, or using illegal drugs.  Usually uses a walker to walk around because of the nonhealing fracture of the right lower extremity.  The patient lives alone.  Her daughter lives close by.  FAMILY HISTORY:  Nothing contributory.  REVIEW OF SYSTEMS:  As present in the history of presenting illness, nothing else significant.  PHYSICAL EXAMINATION:  GENERAL:  The patient examined at bedside, not in acute distress. VITAL SIGNS:  Blood pressure is 168/80, pulse 58 per minute, temperature 98.8, respirations 18, O2 sats 97% on room air. HEENT:  Anicteric.  No pallor.  No discharge from ears, eyes, nose, or mouth. CHEST:  Bilateral air entry present.  No rhonchi.  No crepitation. HEART:  S1 and S2 heard. ABDOMEN:  Soft, nontender.  Bowel sounds heard. CNS:  The patient is alert, awake, oriented to time, place, and person. Moves upper and lower extremities 5/5. EXTREMITIES:  Peripheral pulses felt.  No edema.  LABORATORY DATA:  EKG shows normal sinus rhythm with heart rate around 60 beats per minute with nonspecific ST-T wave changes.  There are some T-wave inversions in inferior lead.  Chest x-ray shows  mild lingular airspace opacity compatible with pneumonia.  CBC, WBC is 7.5, hemoglobin 13.8, hematocrit 36.2, platelets 250. Complete metabolic panel, sodium 140, potassium 4.2, chloride ___________, BUN 11, creatinine 0.9, total bilirubin is 0.2, alkaline phosphatase is 80, AST 18, ALT 7, total protein 7, albumin 3.5, calcium 9.  CKs 54, MB is 1.9, troponin less than 0.3.  ASSESSMENT: 1. Pneumonia. 2. Palpitation with history of atrial fibrillation, presently in sinus     rhythm. 3. History of hypertension. 4. History of hyperlipidemia. 5. History of coronary artery disease, status  post stenting, presently     chest pain free.  PLAN: 1. At this time, admit the patient to telemetry. 2. For her pneumonia, we will keep the patient on ceftriaxone and     Zithromax to treat as community acquired. 3. For her palpitation with history of AFib, presently the patient in     sinus rhythm, we will check thyroid function test to rule out any     hypothyroidism.  We will continue present medications. 4. Further recommendation based on test order and clinical course.     Eduard Clos, MD     ANK/MEDQ  D:  01/29/2011  T:  01/29/2011  Job:  [id number]  cc:   Evelena Peat, M.D. Peter M. [place], M.D.  Electronically Signed by Midge Minium MD on 02/08/2011 12:00:16 PM

## 2011-02-13 LAB — BASIC METABOLIC PANEL
BUN: 11 mg/dL (ref 6–23)
BUN: 17 mg/dL (ref 6–23)
BUN: 8 mg/dL (ref 6–23)
BUN: 8 mg/dL (ref 6–23)
CO2: 27 mEq/L (ref 19–32)
CO2: 28 mEq/L (ref 19–32)
CO2: 30 mEq/L (ref 19–32)
Calcium: 7.8 mg/dL — ABNORMAL LOW (ref 8.4–10.5)
Calcium: 7.8 mg/dL — ABNORMAL LOW (ref 8.4–10.5)
Calcium: 7.9 mg/dL — ABNORMAL LOW (ref 8.4–10.5)
Calcium: 8 mg/dL — ABNORMAL LOW (ref 8.4–10.5)
Chloride: 100 mEq/L (ref 96–112)
Chloride: 103 mEq/L (ref 96–112)
Chloride: 105 mEq/L (ref 96–112)
Creatinine, Ser: 0.93 mg/dL (ref 0.4–1.2)
Creatinine, Ser: 1 mg/dL (ref 0.4–1.2)
Creatinine, Ser: 1 mg/dL (ref 0.4–1.2)
GFR calc Af Amer: >60 mL/min (ref >60)
GFR calc non Af Amer: 53 mL/min — ABNORMAL LOW (ref >60)
GFR calc non Af Amer: 55 mL/min — ABNORMAL LOW (ref >60)
GFR calc non Af Amer: 58 mL/min — ABNORMAL LOW (ref >60)
Glucose, Bld: 100 mg/dL — ABNORMAL HIGH (ref 70–99)
Glucose, Bld: 103 mg/dL — ABNORMAL HIGH (ref 70–99)
Glucose, Bld: 139 mg/dL — ABNORMAL HIGH (ref 70–99)
Potassium: 4 mEq/L (ref 3.5–5.1)
Potassium: 4.1 mEq/L (ref 3.5–5.1)
Sodium: 135 mEq/L (ref 135–145)
Sodium: 138 mEq/L (ref 135–145)

## 2011-02-13 LAB — COMPREHENSIVE METABOLIC PANEL
Albumin: 3.7 g/dL (ref 3.5–5.2)
BUN: 16 mg/dL (ref 6–23)
Calcium: 9.3 mg/dL (ref 8.4–10.5)
Creatinine, Ser: 1.03 mg/dL (ref 0.4–1.2)
Potassium: 3.9 mEq/L (ref 3.5–5.1)
Total Protein: 6.5 g/dL (ref 6.0–8.3)

## 2011-02-13 LAB — CBC
HCT: 32.4 % — ABNORMAL LOW (ref 36.0–46.0)
HCT: 33.6 % — ABNORMAL LOW (ref 36.0–46.0)
HCT: 39.9 % (ref 36.0–46.0)
Hemoglobin: 10.1 g/dL — ABNORMAL LOW (ref 12.0–15.0)
Hemoglobin: 11.2 g/dL — ABNORMAL LOW (ref 12.0–15.0)
MCHC: 33.3 g/dL (ref 30.0–36.0)
MCHC: 33.7 g/dL (ref 30.0–36.0)
MCHC: 33.7 g/dL (ref 30.0–36.0)
MCHC: 33.8 g/dL (ref 30.0–36.0)
MCV: 90.5 fL (ref 78.0–100.0)
MCV: 92.3 fL (ref 78.0–100.0)
MCV: 92.4 fL (ref 78.0–100.0)
Platelets: 115 10*3/uL — ABNORMAL LOW (ref 150–400)
Platelets: 123 10*3/uL — ABNORMAL LOW (ref 150–400)
Platelets: 145 10*3/uL — ABNORMAL LOW (ref 150–400)
Platelets: 197 10*3/uL (ref 150–400)
RBC: 2.94 MIL/uL — ABNORMAL LOW (ref 3.87–5.11)
RBC: 3.58 MIL/uL — ABNORMAL LOW (ref 3.87–5.11)
RDW: 13.5 % (ref 11.5–15.5)
RDW: 13.6 % (ref 11.5–15.5)
RDW: 14.3 % (ref 11.5–15.5)
RDW: 14.3 % (ref 11.5–15.5)
WBC: 4.5 10*3/uL (ref 4.0–10.5)
WBC: 5.8 10*3/uL (ref 4.0–10.5)
WBC: 9 10*3/uL (ref 4.0–10.5)

## 2011-02-13 LAB — TYPE AND SCREEN: Donor AG Type: NEGATIVE

## 2011-02-13 LAB — PROTIME-INR
INR: 1 (ref 0.00–1.49)
INR: 1.1 (ref 0.00–1.49)
INR: 1.6 — ABNORMAL HIGH (ref 0.00–1.49)
INR: 1.8 — ABNORMAL HIGH (ref 0.00–1.49)
INR: 2 — ABNORMAL HIGH (ref 0.00–1.49)
Prothrombin Time: 13.7 seconds (ref 11.6–15.2)
Prothrombin Time: 20.2 seconds — ABNORMAL HIGH (ref 11.6–15.2)
Prothrombin Time: 22.2 seconds — ABNORMAL HIGH (ref 11.6–15.2)
Prothrombin Time: 23.5 seconds — ABNORMAL HIGH (ref 11.6–15.2)

## 2011-02-13 LAB — URINE MICROSCOPIC-ADD ON

## 2011-02-13 LAB — CROSSMATCH: Antibody Screen: POSITIVE

## 2011-02-13 LAB — URINALYSIS, ROUTINE W REFLEX MICROSCOPIC
Glucose, UA: NEGATIVE mg/dL
Specific Gravity, Urine: 1.018 (ref 1.005–1.030)
pH: 5.5 (ref 5.0–8.0)

## 2011-02-13 LAB — B-NATRIURETIC PEPTIDE (CONVERTED LAB): Pro B Natriuretic peptide (BNP): 353 pg/mL — ABNORMAL HIGH (ref 0.0–100.0)

## 2011-02-17 ENCOUNTER — Encounter: Payer: Self-pay | Admitting: Physician Assistant

## 2011-02-17 ENCOUNTER — Ambulatory Visit (INDEPENDENT_AMBULATORY_CARE_PROVIDER_SITE_OTHER): Payer: Medicare Other | Admitting: Physician Assistant

## 2011-02-17 VITALS — BP 142/84 | HR 58 | Resp 18 | Ht 66.0 in | Wt 168.0 lb

## 2011-02-17 DIAGNOSIS — I10 Essential (primary) hypertension: Secondary | ICD-10-CM

## 2011-02-17 DIAGNOSIS — I4891 Unspecified atrial fibrillation: Secondary | ICD-10-CM

## 2011-02-17 DIAGNOSIS — E785 Hyperlipidemia, unspecified: Secondary | ICD-10-CM

## 2011-02-17 DIAGNOSIS — R002 Palpitations: Secondary | ICD-10-CM

## 2011-02-17 DIAGNOSIS — I2510 Atherosclerotic heart disease of native coronary artery without angina pectoris: Secondary | ICD-10-CM

## 2011-02-17 DIAGNOSIS — R001 Bradycardia, unspecified: Secondary | ICD-10-CM

## 2011-02-17 DIAGNOSIS — I498 Other specified cardiac arrhythmias: Secondary | ICD-10-CM

## 2011-02-17 MED ORDER — LANSOPRAZOLE 30 MG PO CPDR: 30.0000 mg | DELAYED_RELEASE_CAPSULE | Freq: Every day | ORAL | Status: DC

## 2011-02-17 MED ORDER — ISOSORBIDE MONONITRATE ER 60 MG PO TB24: 60.0000 mg | ORAL_TABLET | Freq: Every day | ORAL | Status: DC

## 2011-02-17 NOTE — Progress Notes (Signed)
History of Present Illness: Primary Cardiologist:  Dr. Peter Sw[place]iland   Dominique Perez is a 77 y.o. female who presents for post hospital follow up.  She has a history of CAD, status post stenting to the RCA in 1999, status post Cypher DES to the diagonal and 2007, paroxysmal atrial fibrillation controlled on Multaq, hypertension, hyperlipidemia, GERD.  Left heart catheterization 6/07: RCA stents patent, D1 90% treated with a Cypher DES, D2 40-50%, EF 60%.  She has not been on Coumadin.    She was admitted 9/20-9/23.  She presented with palpitations and cough.  Her chest x-ray was consistent with pneumonia and she was treated with antibiotics.  Of note, Zithromax and Levaquin were both discontinued secondary to interactions with Multaq.  She did develop diarrhea with doxycycline and this was changed to Ceftin.  C. Difficile testing was negative.  According to the records, there was no indication that the patient developed recurrent atrial fibrillation during her hospitalization.  She did have some bradycardia and her metoprolol was decreased.  Dr. Graciela Husbands saw the patient and recommended discussing with her primary cardiologist whether or not to pursue chronic anticoagulation therapy given her significant thromboembolic risk factors.  CHADS2-VASc score is 5.    Since discharge, she has felt well.  No further palpitations.  No chest pain.  No significant dyspnea.  No orthopnea, PND, edema.  She is limited by her right leg which was fractured 3 years ago.  She walks with a walker.  Denies syncope, near syncope, dizziness or weakness.  Denies frequent falls.  No cough or fever.    Past Medical History  Diagnosis Date  . Coronary artery disease     s/p pci 1999 rca, 2007 diagonal  . Hypertension   . Tremor     chronic  . Leg pain, right     chronic  . Arthritis   . GERD (gastroesophageal reflux disease)   . Hyperlipidemia   . Gallstones   . Atrial fibrillation   . Fracture of right femur     Current  Outpatient Prescriptions  Medication Sig Dispense Refill  . aspirin 325 MG tablet Take 325 mg by mouth daily.        Marland Kitchen dronedarone (MULTAQ) 400 MG tablet Take 400 mg by mouth 2 (two) times daily with a meal.        . isosorbide mononitrate (IMDUR) 60 MG 24 hr tablet Take 60 mg by mouth daily.        . lansoprazole (PREVACID) 30 MG capsule Take 30 mg by mouth daily.        . metoprolol (LOPRESSOR) 50 MG tablet Take 50 mg by mouth 3 (three) times daily.        . Multiple Vitamin (MULTIVITAMIN) tablet Take 1 tablet by mouth daily.        . simvastatin (ZOCOR) 20 MG tablet Take 20 mg by mouth at bedtime. Taking 1/2 daily       . traMADol-acetaminophen (ULTRACET) 37.5-325 MG per tablet Take 1 tablet by mouth every 6 (six) hours as needed.          Allergies: Allergies  Allergen Reactions  . Amiodarone     tremor  . Erythromycin Stearate     REACTION: fever  . Ferrous Sulfate     REACTION: fever  . Iohexol      Code: HIVES, Desc: Pt states that 10 yrs ago she IV contrast for a CT and broke out in hives and experienced SOB.  Done w/o iv contrast today., Onset Date: [id number]     Vital Signs: BP 142/84  Pulse 58  Resp 18  Ht 5\' 6"  (1.676 m)  Wt 168 lb (76.204 kg)  BMI 27.12 kg/m2  PHYSICAL EXAM: Well nourished, well developed, in no acute distress HEENT: normal Neck: no JVD At 90 Cardiac:  normal S1, S2; RRR; no murmur Lungs:  clear to auscultation bilaterally, no wheezing, rhonchi or rales Abd: soft, nontender Ext: no edema Skin: warm and dry Neuro:  CNs 2-12 intact, no focal abnormalities noted Psych: Normal affect  EKG:  Sinus bradycardia, rate 58, left axis deviation, nonspecific ST-T wave changes  ASSESSMENT AND PLAN:

## 2011-02-17 NOTE — Assessment & Plan Note (Signed)
Somewhat elevated today.  Her Lopressor dose was decreased in the hospital due to bradycardia.  Continue to monitor blood pressure for now.

## 2011-02-17 NOTE — Assessment & Plan Note (Addendum)
She is maintaining normal sinus rhythm.  She is not on Coumadin for unclear reasons.  She is not aware of any intolerances.  She does not describe any difficulty with frequent falls or bleeding tendencies.  She does have a high thromboembolic risk factor profile.  She did not have atrial fibrillation documented in the hospital.  I will go ahead and set her up with an event monitor.  I will also have her undergo a 2-D echocardiogram.  She will remain on aspirin for now.  I discussed risks and benefits of coumadin.  She will remain on Multaq.  Of note, her TSH and LFTs were normal in the hospital.  Of note, she is a prior patient of Dr. Deborah Chalk and just established with Dr. Sw[place]iland recently.  I will have her followup with Dr. [place]iland in 6-8 weeks after her monitor is completed to discuss coumadin further.

## 2011-02-17 NOTE — Assessment & Plan Note (Signed)
Stable.  No angina.  Continue aspirin and statin. 

## 2011-02-17 NOTE — Patient Instructions (Signed)
Your physician recommends that you schedule a follow-up appointment in: 6-8 weeks with Dr [place] Your physician has recommended that you wear an event monitor. Event monitors are medical devices that record the heart's electrical activity. Doctors most often [place] these monitors to diagnose arrhythmias. Arrhythmias are problems with the speed or rhythm of the heartbeat. The monitor is a small, portable device. You can wear one while you do your normal daily activities. This is usually used to diagnose what is causing palpitations/syncope (passing out). Your physician has requested that you have an echocardiogram. Echocardiography is a painless test that uses sound waves to create images of your heart. It provides your doctor with information about the size and shape of your heart and how well your heart's chambers and valves are working. This procedure takes approximately one hour. There are no restrictions for this procedure.

## 2011-02-17 NOTE — Assessment & Plan Note (Signed)
Asymptomatic. 

## 2011-02-17 NOTE — Assessment & Plan Note (Signed)
She is on an appropriate dose of simvastatin along with concomitant Multaq use.

## 2011-02-18 ENCOUNTER — Telehealth: Payer: Self-pay | Admitting: Cardiology

## 2011-02-18 NOTE — Telephone Encounter (Signed)
Pt called and wants Lawson Fiscal to call her. No reference given.

## 2011-02-18 NOTE — Telephone Encounter (Signed)
I called patient this morning. Her visit from yesterday with Tereso Newcomer is reviewed. She is to have an event monitor placed. She is "desperate" to get back on her metoprolol 50 mg TID. Her dose was cut in half at her recent hospitalization with pneumonia due to bradycardia. She is asymptomatic from that standpoint, but continues to have these "quivering spells with fast heart beat". She says she is just miserable. She was to have her event monitor placed next week. I will have her come tomorrow for the event monitor. I can see her back to review the findings with Dr. [place]. She may increase the metoprolol back to 50 mg TID. I explained that we may be dealing with tachy/brady and that a pacemaker may be warranted. She understands and will be back in touch if she has any problems/complaints. Will need to readdress the issue of coumadin on return as well.

## 2011-02-19 DIAGNOSIS — R001 Bradycardia, unspecified: Secondary | ICD-10-CM

## 2011-02-19 DIAGNOSIS — R002 Palpitations: Secondary | ICD-10-CM

## 2011-02-19 DIAGNOSIS — I4891 Unspecified atrial fibrillation: Secondary | ICD-10-CM

## 2011-02-19 NOTE — Consult Note (Signed)
NAME:  Dominique Perez, PRIMO NO.:  [id number]  MEDICAL RECORD NO.:  [id number]  LOCATION:  2037                         FACILITY:  [facility]  PHYSICIAN:  Rollene Rotunda, MD, FACCDATE OF BIRTH:  1927/03/19  DATE OF CONSULTATION:  01/29/2011 DATE OF DISCHARGE:                                CONSULTATION   PRIMARY CARE PHYSICIAN:  Evelena Peat, MD  PRIMARY CARDIOLOGIST:  Peter M. Swaziland, MD  CHIEF COMPLAINT:  Palpitations.  HISTORY OF PRESENT ILLNESS:  Dominique Perez is an 77 year old female with a history of coronary artery disease and PAF.  She reports a 3-day history of episodic palpitations.  She was getting these episodes 2-3 times a day.  They are not associated with exertion.  When she gets them, she gets a funny feeling in her face.  She denies chest pain, shortness of breath, presyncope, or syncope.  She does not know how long they last but thinks that it is a relatively brief time.  In addition to the palpitations, she has a long history of a cough and says that she has had greenish sputum for a while.  Her symptoms worsened and she came to the emergency room.  She was found to have pneumonia and because of the palpitations, Cardiology was asked to evaluate her.  Today, she had a feeling that her heart was pounding because she had not had her metoprolol.  She did not feel that her heart was irregular.  On telemetry review, she has been in sinus rhythm since admission.  Her home dose of metoprolol was ordered and she is feeling much better.  PAST MEDICAL HISTORY: 1. Status post stent to the RCA in 1999. 2. Status post cath last in 2007 with LAD patent, RCA patent,     circumflex and left main patent, first diagonal 90% reduced to zero     with a stent and second diagonal 50%, EF 60%. 3. PAF. 4. Hypertension. 5. Hyperlipidemia. 6. Osteoarthritis. 7. Status post Myoview in 2009 showing an EF of 76% and no ischemia.  PAST SURGICAL HISTORY:  She is status post  cardiac catheterization as well as right total hip replacement, right total knee replacement, right femur repair, repair of a pseudoaneurysm in 1999 post cath, tonsillectomy, and bilateral bunionectomy.  ALLERGIES:  She in the past has been allergic or intolerant to IRON, ERYTHROMYCIN, SULFA, IV DYE, and AMIODARONE.  CURRENT MEDICATIONS: 1. Aspirin 325 mg a day. 2. Zithromax 500 mg daily. 3. Rocephin 1 gram daily. 4. Multaq 400 mg b.i.d. 5. Imdur 60 mg a day. 6. Lopressor 50 mg t.i.d. 7. Zocor 20 mg a day.  SOCIAL HISTORY:  She lives in [place] with family nearby.  She is retired.  She is a widow.  She has orthopedic issues and uses a walker at home.  She denies alcohol, tobacco, or drug abuse.  FAMILY HISTORY:  There is no known coronary artery disease in her father or siblings but her mother died with an MI.  REVIEW OF SYSTEMS:  She has some vision loss.  She has not had fevers, chills, or sweats.  The palpitations are described above.  The cough is productive with greenish  sputum.  She has significant arthralgias and joint pains which limit her activity, but she has not fallen recently. Her last significant fall was in 2010 which was in the snow.  Full 14- point review of systems is otherwise negative except as stated in the HPI.  PHYSICAL EXAMINATION:  VITAL SIGNS:  Temperature is 98.8, blood pressure 168/83, heart rate 68, respiratory rate 18, and O2 saturation 97% on room air. GENERAL:  She is a well-developed elderly white female in no acute distress. HEENT:  Normal. NECK:  There is no lymphadenopathy, thyromegaly, bruit, or JVD noted. CV:  Heart is regular in rate and rhythm with an S1-S2 and no significant murmur, rub, or gallop is noted.  Distal pulses are intact in all 4 extremities. LUNGS:  She has rales in both bases, left greater than right but no crackles are noted. SKIN:  No rashes or lesions are noted. ABDOMEN:  Soft and nontender with active bowel  sounds. EXTREMITIES:  There is no cyanosis, clubbing, or edema noted. MUSCULOSKELETAL:  There is no joint deformity or effusions and no spine or CVA tenderness. NEURO:  She is alert and oriented.  Cranial nerves II-XII grossly intact.  IMAGING:  Chest x-ray shows mild cardiomegaly and mild lingular airspace opacities suspicious for pneumonia but no evidence of effusion or pneumothorax.  EKG is sinus rhythm, rate 60 with diffuse T-wave changes and slightly different from an EKG dated 2010, of unclear significance.  LABORATORY VALUES:  Hemoglobin 13.8, hematocrit 41.1, WBC 6.6, and platelets of 159.  Sodium 140, potassium 3.7, chloride 103, CO2 28, BUN 9, creatinine 0.94, glucose 87, GFR 57, albumin 3.3, other CMET values within normal limits.  CK-MB and troponin I negative x2.  TFTs are pending.  IMPRESSION:  Dominique Perez was seen today by Dr. Antoine Poche, the patient evaluated and the data reviewed.  She has history of paroxysmal atrial fibrillation and was admitted with pneumonia.  She has been having palpitations and weak spells.  She called the office and wants to get a monitor.  On telemetry here, she has premature atrial contractions but is in sinus rhythm.  She has had no chest pain.  She has chronic cough.  PLAN:  Palpitations:  She has no evidence of PAF recently.  She is appropriate to follow up with the outpatient event monitor.  She should be continued on Multaq.  Her CHADS score is elevated and if PAF is seen, Coumadin may need to be considered.     Theodore Demark, PA-C   ______________________________ Rollene Rotunda, MD, [facility]    RB/MEDQ  D:  01/29/2011  T:  01/29/2011  Job:  [id number]  Electronically Signed by Theodore Demark PA-C on 02/19/2011 06:45:17 AM Electronically Signed by Rollene Rotunda MD [facility] on 02/19/2011 01:40:31 PM

## 2011-02-20 DIAGNOSIS — R002 Palpitations: Secondary | ICD-10-CM

## 2011-02-20 NOTE — Discharge Summary (Signed)
Dominique Perez, Dominique Perez                 ACCOUNT NO.:  [id number]  MEDICAL RECORD NO.:  [id number]  LOCATION:  2037                         FACILITY:  [facility]  PHYSICIAN:  Kela Millin, M.D.DATE OF BIRTH:  07/18/1926  DATE OF ADMISSION:  01/29/2011 DATE OF DISCHARGE:  02/01/2011                        DISCHARGE SUMMARY - REFERRING   DISCHARGE DIAGNOSES: 1. Community-acquired pneumonia. 2. Diarrhea -- secondary to antibiotic/doxycycline, improved. 3. Paroxysmal atrial fibrillation -- in normal sinus rhythm with     paroxysmal atrial fibrillation on tele of this hospital stay. 4. Hypertension. 5. Hyperlipidemia. 6. History of osteoarthritis. 7. Coronary artery disease and status post stents in the past.  PROCEDURES AND STUDIES:  Chest x-ray -- mild lingular air space opacity compatible with pneumonia.  CONSULTATIONS:  Cardiology -- Dr. Shona Simpson. Graciela Husbands.  BRIEF HISTORY:  The patient is an 77 year old white female with the above-listed medical problems who presented with complaints of palpitations on and off for 3 days.  She also reported a cough that was productive of greenish sputum.  In the ED, an EKG was done and showed some bigeminy.  Chest x-ray showed a lingular pneumonia.  She was admitted for further evaluation and management.  HOSPITAL COURSE: 1. Community-acquired pneumonia -- as discussed above upon admission     the patient had a chest x-ray done which revealed findings     consistent with a pneumonia in her lingular lobe.  She was     initially started on empiric antibiotics with Rocephin and     Zithromax, but on follow up, she was changed to doxycycline due to     the interaction noted between the Zithromax and the Multaq she is     on.  Following the change to a doxycycline, the patient developed     diarrhea and it was suspected to be secondary to the doxycycline,     so the doxycycline was discontinued.  A C diff was ordered, but     sample was not collected  as her diarrhea improved.  After     discontinuing the doxycycline, she had been started on Levaquin,     but it was noted that Levaquin/quinolones interact with Multaq as     well and so the patient has been switched to Ceftin at this time.     She has remained afebrile with no leukocytosis and her cough is     improved.  She is clinically stable for discharge to home at this     time and she is to follow up outpatient. 2. Paroxysmal atrial fibrillation -- as noted, the patient was     complaining of palpitations and it was noted that she had some     bigemini on the EKG in the ED and so she was monitored on tele     while in the hospital and Cardiology was consulted and Dr. Antoine Poche     saw the patient and stated that she was appropriate to follow up     with outpatient event monitor and recommended to continue her     Multaq.  She was also on metoprolol and this was continued.  While     in the  hospital, she had some sinus bradycardia and so Cardiology     adjusted the dose of her metoprolol to 25 p.o. t.i.d. today prior     to discharge.  She has been in sinus rhythm here in the hospital     with premature atrial contractions, but Dr. Antoine Poche had noted that     her [place] score is elevated and that is paroxysmal atrial     fibrillation is seen on monitor that Coumadin may need to be     considered.  She is to follow up outpatient for continued     monitoring and her primary cardiologist -- Dr. Peter Swaziland to     further manage as clinically appropriate. 3. Hypertension -- her blood pressures were controlled on her     outpatient medications and she is to continue them upon discharge. 4. Hyperlipidemia -- she is to continue her Zocor upon discharge.  DISCHARGE MEDICATIONS: 1. Ceftin 500 mg one p.o. b.i.d. for 6 more days. 2. Metoprolol 25 mg p.o. t.i.d. 3. Aspirin 325 mg p.o. daily. 4. Isosorbide mononitrate 60 mg p.o. daily. 5. Multaq 400 mg p.o. b.i.d. 6. Multivitamins one p.o.  daily. 7. Zocor 20 mg p.o. at bedtime. 8. Tramadol 37.5/325 mg p.o. q.6 hours p.r.n.  FOLLOWUP CARE: 1. Primary care physician, Dr. Evelena Peat in 1 to 2 weeks. 2. Dr. Peter Swaziland in 4 to 6 weeks, the patient is to call office     upon discharge regarding her outpatient monitor as per Dr.     Antoine Poche.  DISCHARGE CONDITION:  Improved/stable.     Kela Millin, M.D.     ACV/MEDQ  D:  02/01/2011  T:  02/01/2011  Job:  [id number]  cc:   Peter M. Sw[place]il[place], M.D. Evelena Peat, M.D. Rollene Rotunda, MD, [facility]  Electronically Signed by Donnalee Curry M.D. on 02/20/2011 07:15:55 PM

## 2011-02-23 ENCOUNTER — Telehealth: Payer: Self-pay | Admitting: Cardiology

## 2011-02-23 NOTE — Telephone Encounter (Signed)
Pt wants to talk you about echo appt she has on wed please call

## 2011-02-23 NOTE — Telephone Encounter (Signed)
Called wanting to know if monitor she is wearing could make her "sick". States she is very nervous and anxious. Advised that the monitor would not cause her to be sick. Tried to reassure her. Also wanted to know if Echo was necessary because she is wearing monitor. Advised she still needs to come to have Echo done. States she will be here.

## 2011-02-24 ENCOUNTER — Other Ambulatory Visit (HOSPITAL_COMMUNITY): Payer: Medicare Other

## 2011-02-25 ENCOUNTER — Other Ambulatory Visit (HOSPITAL_COMMUNITY): Payer: Medicare Other

## 2011-02-25 ENCOUNTER — Ambulatory Visit (HOSPITAL_COMMUNITY): Payer: Medicare Other | Attending: Cardiology

## 2011-02-25 DIAGNOSIS — I2510 Atherosclerotic heart disease of native coronary artery without angina pectoris: Secondary | ICD-10-CM | POA: Insufficient documentation

## 2011-02-25 DIAGNOSIS — I059 Rheumatic mitral valve disease, unspecified: Secondary | ICD-10-CM | POA: Insufficient documentation

## 2011-02-25 DIAGNOSIS — I079 Rheumatic tricuspid valve disease, unspecified: Secondary | ICD-10-CM | POA: Insufficient documentation

## 2011-02-25 DIAGNOSIS — I10 Essential (primary) hypertension: Secondary | ICD-10-CM | POA: Insufficient documentation

## 2011-02-25 DIAGNOSIS — R002 Palpitations: Secondary | ICD-10-CM | POA: Insufficient documentation

## 2011-02-25 DIAGNOSIS — E785 Hyperlipidemia, unspecified: Secondary | ICD-10-CM | POA: Insufficient documentation

## 2011-02-25 DIAGNOSIS — I4891 Unspecified atrial fibrillation: Secondary | ICD-10-CM

## 2011-03-03 ENCOUNTER — Encounter: Payer: Self-pay | Admitting: Cardiology

## 2011-03-03 DIAGNOSIS — R002 Palpitations: Secondary | ICD-10-CM

## 2011-03-06 ENCOUNTER — Ambulatory Visit (INDEPENDENT_AMBULATORY_CARE_PROVIDER_SITE_OTHER): Payer: Medicare Other | Admitting: Family Medicine

## 2011-03-06 ENCOUNTER — Encounter: Payer: Self-pay | Admitting: Family Medicine

## 2011-03-06 DIAGNOSIS — J189 Pneumonia, unspecified organism: Secondary | ICD-10-CM

## 2011-03-06 DIAGNOSIS — E785 Hyperlipidemia, unspecified: Secondary | ICD-10-CM

## 2011-03-06 DIAGNOSIS — I10 Essential (primary) hypertension: Secondary | ICD-10-CM

## 2011-03-06 DIAGNOSIS — I4891 Unspecified atrial fibrillation: Secondary | ICD-10-CM

## 2011-03-06 NOTE — Progress Notes (Signed)
Subjective:    Patient ID: Dominique Perez, female    DOB: 1926/05/15, 77 y.o.   MRN: [id number]  HPI  Medical followup. Recent pneumonia. Clinically improved. No further cough. Had subsequent diarrhea following antibiotics. That has fully resolved. She's had some intermittent palpitations. Prior history of atrial fibrillation. Treated with Multaq but no anticoagulants. No atrial fibrillation during recent hospitalization. Patient recently had Holter monitor placed by cardiology that she felt very anxious wearing this and stopped this prematurely a few days ago. Her anxiety symptoms improved after stopping the monitor. She denies any recent dizziness, chest pains, or dyspnea.  Influenza vaccine given in September. Pneumovax 2007. No recent falls.  Past Medical History  Diagnosis Date  . Coronary artery disease     s/p pci 1999 rca, 2007 diagonal  . Hypertension   . Tremor     chronic  . Leg pain, right     chronic  . Arthritis   . GERD (gastroesophageal reflux disease)   . Hyperlipidemia   . Gallstones   . Atrial fibrillation   . Fracture of right femur    Past Surgical History  Procedure Date  . Knee surgery     right, titanium rod in the right femur  . Femoral pseudoaneurysm 1999    repair  . Cardiac catheterization 1999     PCI to RCA  . Cardiac catheterization 2007    PCI to diagonal  . Cardiac catheterization 2002    treated medically  . Total hip arthroplasty     Dr. Cleophas Dunker  . Cataract extraction   . Left knee 2009    replacement by Dr. Lequita Halt    reports that she has never smoked. She does not have any smokeless tobacco history on file. She reports that she does not drink alcohol. Her drug history not on file. family history includes Cancer in her father and Heart attack in her mother. Allergies  Allergen Reactions  . Amiodarone     tremor  . Erythromycin Stearate     REACTION: fever  . Ferrous Sulfate     REACTION: fever  . Iohexol      Code: HIVES,  Desc: Pt states that 10 yrs ago she IV contrast for a CT and broke out in hives and experienced SOB.  Done w/o iv contrast today., Onset Date: [id number]       Review of Systems  Constitutional: Negative for fever and chills.  Respiratory: Negative for cough and shortness of breath.   Cardiovascular: Positive for palpitations. Negative for chest pain and leg swelling.  Gastrointestinal: Negative for abdominal pain.  Genitourinary: Negative for dysuria.  Neurological: Negative for syncope and headaches.  Psychiatric/Behavioral: Negative for decreased concentration. The patient is nervous/anxious.        Objective:   Physical Exam  Constitutional: She is oriented to person, place, and time. She appears well-developed and well-nourished. No distress.  Neck: Neck supple. No thyromegaly present.  Cardiovascular: Normal rate, regular rhythm and normal heart sounds.   Pulmonary/Chest: Effort normal and breath sounds normal. No respiratory distress. She has no wheezes. She has no rales.  Musculoskeletal: She exhibits no edema.  Lymphadenopathy:    She has no cervical adenopathy.  Neurological: She is alert and oriented to person, place, and time.          Assessment & Plan:  #1 recent pneumonia clinically improved.  #2 health maintenance. Flu vaccine already given. Pneumovax up-to-date. #3 hyperlipidemia.  #4 hypertension stable #5 history of atrial  fibrillation currently sinus rhythm. She is encouraged to try Holter monitor again and close followup with cardiology

## 2011-03-25 ENCOUNTER — Telehealth: Payer: Self-pay | Admitting: *Deleted

## 2011-03-25 NOTE — Telephone Encounter (Signed)
Called pt with ECardio results.

## 2011-04-15 ENCOUNTER — Ambulatory Visit (INDEPENDENT_AMBULATORY_CARE_PROVIDER_SITE_OTHER): Payer: Medicare Other | Admitting: Cardiology

## 2011-04-15 ENCOUNTER — Encounter: Payer: Self-pay | Admitting: Cardiology

## 2011-04-15 VITALS — BP 122/78 | HR 55 | Ht 66.0 in | Wt 169.1 lb

## 2011-04-15 DIAGNOSIS — I498 Other specified cardiac arrhythmias: Secondary | ICD-10-CM

## 2011-04-15 DIAGNOSIS — R001 Bradycardia, unspecified: Secondary | ICD-10-CM

## 2011-04-15 DIAGNOSIS — I10 Essential (primary) hypertension: Secondary | ICD-10-CM

## 2011-04-15 DIAGNOSIS — I2510 Atherosclerotic heart disease of native coronary artery without angina pectoris: Secondary | ICD-10-CM

## 2011-04-15 DIAGNOSIS — E785 Hyperlipidemia, unspecified: Secondary | ICD-10-CM

## 2011-04-15 DIAGNOSIS — I4891 Unspecified atrial fibrillation: Secondary | ICD-10-CM

## 2011-04-15 MED ORDER — RIVAROXABAN 20 MG PO TABS: 20.0000 mg | ORAL_TABLET | Freq: Every day | ORAL | Status: DC

## 2011-04-15 NOTE — Assessment & Plan Note (Signed)
Her atrial fibrillation is well controlled on Multaq. She has had no known recurrence over the past couple of years. We discussed the fact that she may have asymptomatic episodes and that she does have a high risk of stroke. I recommended anticoagulation chronically and we will start her on Xarelto 20 mg daily. I will followup again in 3 months. I recommended she stop taking aspirin.

## 2011-04-15 NOTE — Assessment & Plan Note (Signed)
She is still mildly bradycardic. I recommended continuing on her current metoprolol dose and she is asymptomatic.

## 2011-04-15 NOTE — Assessment & Plan Note (Signed)
No recurrent anginal symptoms. Her last stress test was in March of 2009 and was normal. Continue to monitor for recurrent symptoms.

## 2011-04-15 NOTE — Patient Instructions (Signed)
We will start you on Xarelto 20 mg daily to reduce your risk of stroke.  Stop taking ASA.   Continue your other therapy.  I will see you again in 3 months.

## 2011-04-15 NOTE — Progress Notes (Signed)
Dominique Perez Date of Birth: 05-Aug-1926 Medical Record #[id number]  History of Present Illness: Dominique Perez is seen for followup visit today. She was admitted in September with pneumonia. During that time she was bradycardic and her metoprolol dose was reduced. She has had no recurrent shortness of breath or cough. She denies any palpitations. She's had no significant chest pain. During her last visit the question was raised about her anticoagulation given her history of atrial fibrillation. She does have a higher risk with a Chads2Vasc score of 5. In reviewing her history she had recurrent episodes of atrial fibrillation in September 2008, February 2009, March of 2009, and then again after knee surgery in January of 2010. She was initially placed on amiodarone but had significant intolerance with nausea, vomiting, diarrhea, and tremor. She was switched to [facility] and has had no documented recurrence. She was on Coumadin at one time but this was discontinued in Khira of 2010.  She does have a history of significant coronary disease. She is status post stenting of the right coronary in 1999. She had stenting of the diagonal 2007 with a drug-eluting stent.  Current Outpatient Prescriptions on File Prior to Visit  Medication Sig Dispense Refill  . dronedarone (MULTAQ) 400 MG tablet Take 400 mg by mouth 2 (two) times daily with a meal.        . isosorbide mononitrate (IMDUR) 60 MG 24 hr tablet Take 1 tablet (60 mg total) by mouth daily.  90 tablet  3  . lansoprazole (PREVACID) 30 MG capsule Take 1 capsule (30 mg total) by mouth daily.  90 capsule  1  . metoprolol (LOPRESSOR) 50 MG tablet Take 50 mg by mouth 3 (three) times daily.        . Multiple Vitamin (MULTIVITAMIN) tablet Take 1 tablet by mouth daily.        . simvastatin (ZOCOR) 20 MG tablet Take 20 mg by mouth at bedtime. Taking 1/2 daily       . traMADol-acetaminophen (ULTRACET) 37.5-325 MG per tablet Take 1 tablet by mouth every 6 (six) hours  as needed.          Allergies  Allergen Reactions  . Amiodarone     tremor  . Erythromycin Stearate     REACTION: fever  . Ferrous Sulfate     REACTION: fever  . Iohexol      Code: HIVES, Desc: Pt states that 10 yrs ago she IV contrast for a CT and broke out in hives and experienced SOB.  Done w/o iv contrast today., Onset Date: [id number]     Past Medical History  Diagnosis Date  . Coronary artery disease     s/p pci 1999 rca, 2007 diagonal  . Hypertension   . Tremor     chronic  . Leg pain, right     chronic  . Arthritis   . GERD (gastroesophageal reflux disease)   . Hyperlipidemia   . Gallstones   . Atrial fibrillation   . Fracture of right femur     Past Surgical History  Procedure Date  . Knee surgery     right, titanium rod in the right femur  . Femoral pseudoaneurysm 1999    repair  . Cardiac catheterization 1999     PCI to RCA  . Cardiac catheterization 2007    PCI to diagonal  . Cardiac catheterization 2002    treated medically  . Total hip arthroplasty     Dr. Cleophas Dunker  . Cataract  extraction   . Left knee 2009    replacement by Dr. Lequita Halt    History  Smoking status  . Never Smoker   Smokeless tobacco  . Not on file    History  Alcohol Use No    Family History  Problem Relation Age of Onset  . Heart attack Mother   . Cancer Father     Review of Systems: The review of systems is positive for chronic leg pain related to previous fracture in 2009. No history of TIA or stroke. No bleeding problems.  All other systems were reviewed and are negative.  Physical Exam: BP 122/78  Pulse 55  Ht 5\' 6"  (1.676 m)  Wt 76.712 kg (169 lb 1.9 oz)  BMI 27.30 kg/m2 She is an elderly white female in no acute distress. She does walk with a walker. She is normocephalic, atraumatic. Pupils are equal round and reactive to light and accommodation. Sclera are clear. Oropharynx is clear. Neck is without JVD or bruits. Lungs are clear. Her neck exam shows a  regular rate and rhythm without gallop or murmur. Abdomen is soft and nontender without masses or edema. She has no focal neurologic abnormalities. LABORATORY DATA: ECG today demonstrates sinus bradycardia with a rate of 55 beats per minute. She has LVH by voltage. She did wear an event monitor for 2 weeks and this demonstrated no significant arrhythmia. Echocardiogram in October demonstrated mild LVH with normal systolic function. Showed mild left atrial enlargement. There was mild to moderate tricuspid insufficiency.  Assessment / Plan:

## 2011-04-20 ENCOUNTER — Other Ambulatory Visit: Payer: Self-pay | Admitting: Cardiology

## 2011-04-20 MED ORDER — DRONEDARONE HCL 400 MG PO TABS: 400.0000 mg | ORAL_TABLET | Freq: Two times a day (BID) | ORAL | Status: DC

## 2011-04-20 NOTE — Telephone Encounter (Signed)
escribe medication per fax request  

## 2011-04-20 NOTE — Telephone Encounter (Signed)
New Msg: pt calling stating that she needs 3 month supply of multaq called into [facility].

## 2011-04-21 ENCOUNTER — Telehealth: Payer: Self-pay | Admitting: Cardiology

## 2011-04-21 MED ORDER — RIVAROXABAN 20 MG PO TABS: 15.0000 mg | ORAL_TABLET | Freq: Every day | ORAL | Status: DC

## 2011-04-21 NOTE — Telephone Encounter (Signed)
PHARMACISTS AWARE  OKAY FOR PT TO TAKE XARELTO 15 MG  EVERY DAY .Dominique Perez

## 2011-04-21 NOTE — Telephone Encounter (Signed)
SPOKE WITH PHARMACISTS  THEY RECOMMEND PT  TAKE XARELTO 15 MG DUE TO KIDNEYS  DO YOU STILL WANT PT TO TAKE 20 MG ?/CY

## 2011-04-21 NOTE — Telephone Encounter (Signed)
New message:  Have a question regarding Xarelto   Ref number [id number]  Please call them regarding this.

## 2011-04-21 NOTE — Telephone Encounter (Signed)
15 mg daily of Xarelto is fine.  Valgene Deloatch Swaziland MD

## 2011-04-30 ENCOUNTER — Telehealth: Payer: Self-pay | Admitting: Cardiology

## 2011-04-30 MED ORDER — RIVAROXABAN 15 MG PO TABS: 15.0000 mg | ORAL_TABLET | Freq: Every day | ORAL | Status: DC

## 2011-04-30 NOTE — Telephone Encounter (Signed)
Pharm needs clarification on xarelto 20mg  they have a note from 12/5 where it was 15mg  and they need to confirm dose

## 2011-05-06 ENCOUNTER — Telehealth: Payer: Self-pay | Admitting: Cardiology

## 2011-05-06 NOTE — Telephone Encounter (Signed)
[facility] pharmacy just calling back to confirm dose of Xarelto as 15 mg not 20 mg. Per Dr. Elvis Coil note 12/11 he confirmed dose as 15 mg. Confirmed w/pharmacist.

## 2011-05-06 NOTE — Telephone Encounter (Signed)
New Msg: Pharmacy calling with questions regarding xarelto. Please call back to discuss further.   Ref # [id number]

## 2011-06-11 DIAGNOSIS — M67919 Unspecified disorder of synovium and tendon, unspecified shoulder: Secondary | ICD-10-CM | POA: Diagnosis not present

## 2011-06-17 ENCOUNTER — Other Ambulatory Visit: Payer: Self-pay | Admitting: *Deleted

## 2011-06-17 ENCOUNTER — Telehealth: Payer: Self-pay | Admitting: Cardiology

## 2011-06-17 MED ORDER — SIMVASTATIN 20 MG PO TABS: 10.0000 mg | ORAL_TABLET | Freq: Every day | ORAL | Status: DC

## 2011-06-17 NOTE — Telephone Encounter (Signed)
New Problem:    Patient called in needing a refill of her simvastatin (ZOCOR) 20 MG tablet with [facility] (tel- [phone]).  Patient is confused and does not really know what her new mail order pharmacy had been switiched to, but she knows that it is no longer [facility].  Please call back if you have any questions.

## 2011-06-17 NOTE — Telephone Encounter (Signed)
REFILLED SIMVASTATIN TO [facility]

## 2011-07-21 ENCOUNTER — Other Ambulatory Visit: Payer: Self-pay

## 2011-07-21 MED ORDER — METOPROLOL TARTRATE 50 MG PO TABS: 50.0000 mg | ORAL_TABLET | Freq: Three times a day (TID) | ORAL | Status: DC

## 2011-07-23 ENCOUNTER — Encounter: Payer: Self-pay | Admitting: Cardiology

## 2011-07-23 ENCOUNTER — Ambulatory Visit (INDEPENDENT_AMBULATORY_CARE_PROVIDER_SITE_OTHER): Payer: Medicare Other | Admitting: Cardiology

## 2011-07-23 VITALS — BP 132/70 | HR 55 | Ht 66.0 in | Wt 173.0 lb

## 2011-07-23 DIAGNOSIS — I4891 Unspecified atrial fibrillation: Secondary | ICD-10-CM | POA: Diagnosis not present

## 2011-07-23 DIAGNOSIS — I10 Essential (primary) hypertension: Secondary | ICD-10-CM | POA: Diagnosis not present

## 2011-07-23 DIAGNOSIS — Z7901 Long term (current) use of anticoagulants: Secondary | ICD-10-CM | POA: Diagnosis not present

## 2011-07-23 DIAGNOSIS — I2510 Atherosclerotic heart disease of native coronary artery without angina pectoris: Secondary | ICD-10-CM

## 2011-07-23 LAB — CBC WITH DIFFERENTIAL/PLATELET
Basophils Relative: 0.5 % (ref 0.0–3.0)
Eosinophils Absolute: 0.1 10*3/uL (ref 0.0–0.7)
Eosinophils Relative: 1.2 % (ref 0.0–5.0)
Hemoglobin: 14.5 g/dL (ref 12.0–15.0)
Lymphocytes Relative: 19.5 % (ref 12.0–46.0)
MCHC: 32.6 g/dL (ref 30.0–36.0)
MCV: 90.2 fl (ref 78.0–100.0)
Monocytes Absolute: 0.8 10*3/uL (ref 0.1–1.0)
Neutro Abs: 6.7 10*3/uL (ref 1.4–7.7)
Neutrophils Relative %: 70.5 % (ref 43.0–77.0)
RBC: 4.92 Mil/uL (ref 3.87–5.11)
WBC: 9.5 10*3/uL (ref 4.5–10.5)

## 2011-07-23 LAB — BASIC METABOLIC PANEL
CO2: 29 mEq/L (ref 19–32)
Chloride: 103 mEq/L (ref 96–112)
Creatinine, Ser: 1.3 mg/dL — ABNORMAL HIGH (ref 0.4–1.2)
Sodium: 140 mEq/L (ref 135–145)

## 2011-07-23 NOTE — Assessment & Plan Note (Signed)
Blood pressure is under excellent control on her current regimen.

## 2011-07-23 NOTE — Progress Notes (Signed)
Dominique Perez Date of Birth: April 15, 1927 Medical Record #[id number]  History of Present Illness: Dominique Perez is seen for followup visit today.  She does have a higher risk with a Chads2Vasc score of 5. She has recurrent episodes of atrial fibrillation in September 2008, February 2009, March of 2009, and then again after knee surgery in January of 2010. She was initially placed on amiodarone but had significant intolerance with nausea, vomiting, diarrhea, and tremor. She was switched to [facility] and has had no documented recurrence. She was on Coumadin at one time but this was discontinued in [place] of 2010. She was placed on Xarelto on her last visit and has tolerated this well without any significant bleeding.  She does have a history of significant coronary disease. She is status post stenting of the right coronary in 1999. She had stenting of the diagonal 2007 with a drug-eluting stent. She denies any recent symptoms of chest pain or shortness of breath. Her activity is very limited because of severe arthritis. She does walk with a walker but even using this aggravates the arthritis in her arms and shoulders.  Current Outpatient Prescriptions on File Prior to Visit  Medication Sig Dispense Refill  . dronedarone (MULTAQ) 400 MG tablet Take 1 tablet (400 mg total) by mouth 2 (two) times daily with a meal.  180 tablet  3  . isosorbide mononitrate (IMDUR) 60 MG 24 hr tablet Take 1 tablet (60 mg total) by mouth daily.  90 tablet  3  . lansoprazole (PREVACID) 30 MG capsule Take 1 capsule (30 mg total) by mouth daily.  90 capsule  1  . metoprolol (LOPRESSOR) 50 MG tablet Take 1 tablet (50 mg total) by mouth 3 (three) times daily.  270 tablet  2  . Multiple Vitamin (MULTIVITAMIN) tablet Take 1 tablet by mouth daily.        . Rivaroxaban (XARELTO) 15 MG TABS tablet Take 1 tablet (15 mg total) by mouth daily.  30 tablet  6  . simvastatin (ZOCOR) 20 MG tablet Take 0.5 tablets (10 mg total) by mouth at  bedtime. Taking 1/2 daily  90 tablet  3  . traMADol-acetaminophen (ULTRACET) 37.5-325 MG per tablet Take 1 tablet by mouth every 6 (six) hours as needed.          Allergies  Allergen Reactions  . Amiodarone     tremor  . Erythromycin Stearate     REACTION: fever  . Ferrous Sulfate     REACTION: fever  . Iohexol      Code: HIVES, Desc: Pt states that 10 yrs ago she IV contrast for a CT and broke out in hives and experienced SOB.  Done w/o iv contrast today., Onset Date: [id number]     Past Medical History  Diagnosis Date  . Coronary artery disease     s/p pci 1999 rca, 2007 diagonal  . Hypertension   . Tremor     chronic  . Leg pain, right     chronic  . Arthritis   . GERD (gastroesophageal reflux disease)   . Hyperlipidemia   . Gallstones   . Atrial fibrillation   . Fracture of right femur     Past Surgical History  Procedure Date  . Knee surgery     right, titanium rod in the right femur  . Femoral pseudoaneurysm 1999    repair  . Cardiac catheterization 1999     PCI to RCA  . Cardiac catheterization 2007  PCI to diagonal  . Cardiac catheterization 2002    treated medically  . Total hip arthroplasty     Dr. Cleophas Dunker  . Cataract extraction   . Left knee 2009    replacement by Dr. Lequita Halt    History  Smoking status  . Never Smoker   Smokeless tobacco  . Not on file    History  Alcohol Use No    Family History  Problem Relation Age of Onset  . Heart attack Mother   . Cancer Father     Review of Systems: The review of systems is positive for chronic leg pain related to previous spiral fracture in 2009. No history of TIA or stroke. No bleeding problems.  All other systems were reviewed and are negative.  Physical Exam: BP 132/70  Pulse 55  Ht 5\' 6"  (1.676 m)  Wt 173 lb (78.472 kg)  BMI 27.92 kg/m2 She is an elderly white female in no acute distress. She does walk with a walker. She is normocephalic, atraumatic. Pupils are equal round and  reactive to light and accommodation. Sclera are clear. Oropharynx is clear. Neck is without JVD or bruits. Lungs are clear. Her neck exam shows a regular rate and rhythm without gallop or murmur. Abdomen is soft and nontender without masses or edema. She has no focal neurologic abnormalities.  LABORATORY DATA: ECG today demonstrates sinus bradycardia with a rate of 55 beats per minute. She has LVH by voltage.   Assessment / Plan:

## 2011-07-23 NOTE — Assessment & Plan Note (Signed)
She has had multiple interventional procedures in the past. She is currently without symptoms of angina.

## 2011-07-23 NOTE — Assessment & Plan Note (Signed)
She's had no symptomatic recurrences on Multaq. She will continue anticoagulation with Xarelto 15 mg daily. We will repeat renal function and blood counts today. I will follow up again in 6 months.

## 2011-07-23 NOTE — Patient Instructions (Signed)
We will check blood counts and kidney function today.  Continue your current medication  I will see you again in 6 months.

## 2011-07-23 NOTE — Progress Notes (Signed)
Addended by: Early Chars on: 07/23/2011 10:24 AM   Modules accepted: Orders

## 2011-09-04 ENCOUNTER — Encounter: Payer: Self-pay | Admitting: Family Medicine

## 2011-09-04 ENCOUNTER — Ambulatory Visit (INDEPENDENT_AMBULATORY_CARE_PROVIDER_SITE_OTHER): Payer: Medicare Other | Admitting: Family Medicine

## 2011-09-04 VITALS — BP 128/70 | Temp 97.6°F | Wt 175.0 lb

## 2011-09-04 DIAGNOSIS — Z299 Encounter for prophylactic measures, unspecified: Secondary | ICD-10-CM

## 2011-09-04 DIAGNOSIS — I10 Essential (primary) hypertension: Secondary | ICD-10-CM | POA: Diagnosis not present

## 2011-09-04 DIAGNOSIS — E785 Hyperlipidemia, unspecified: Secondary | ICD-10-CM

## 2011-09-04 DIAGNOSIS — M159 Polyosteoarthritis, unspecified: Secondary | ICD-10-CM | POA: Diagnosis not present

## 2011-09-04 LAB — HEPATIC FUNCTION PANEL
Albumin: 3.6 g/dL (ref 3.5–5.2)
Total Bilirubin: 0.2 mg/dL — ABNORMAL LOW (ref 0.3–1.2)

## 2011-09-04 LAB — LIPID PANEL
HDL: 79.9 mg/dL (ref >39.00)
LDL Cholesterol: 59 mg/dL (ref 0–99)
Total CHOL/HDL Ratio: 2
Triglycerides: 94 mg/dL (ref 0.0–149.0)

## 2011-09-04 MED ORDER — TRAMADOL-ACETAMINOPHEN 37.5-325 MG PO TABS: ORAL_TABLET | ORAL | Status: DC

## 2011-09-04 MED ORDER — ZOSTER VACCINE LIVE 19400 UNT/0.65ML ~~LOC~~ SOLR: 0.6500 mL | Freq: Once | SUBCUTANEOUS | Status: DC

## 2011-09-04 NOTE — Progress Notes (Signed)
Subjective:    Patient ID: Dominique Perez, female    DOB: 02-23-1927, 78 y.o.   MRN: [id number]  HPI  Medical followup. Patient has history of CAD, hypertension, atrial fibrillation, hyperlipidemia, osteoarthritis involving multiple joints, GERD. Medications reviewed. Had recent renal profile through cardiologist. Stage III chronic kidney disease. Electrolytes stable. Has not had recent lipid panel or hepatic panel.  On simvastatin 20 mg daily. No recent chest pain.  Ongoing arthritis issues mostly upper extremities including shoulders and hands. Using tramadol acetaminophen combination one and one half tablets 3 times daily and still has poor control at times. Not a candidate for nonsteroidal with her age and blood pressure. She remains on Xarelto for atrial fibrillation. No recent bleeding complications.  Patient has not had prior shingles vaccine. She's had previous Pneumovax estimated 6-8 years ago.  Past Medical History  Diagnosis Date  . Coronary artery disease     s/p pci 1999 rca, 2007 diagonal  . Hypertension   . Tremor     chronic  . Leg pain, right     chronic  . Arthritis   . GERD (gastroesophageal reflux disease)   . Hyperlipidemia   . Gallstones   . Atrial fibrillation   . Fracture of right femur    Past Surgical History  Procedure Date  . Knee surgery     right, titanium rod in the right femur  . Femoral pseudoaneurysm 1999    repair  . Cardiac catheterization 1999     PCI to RCA  . Cardiac catheterization 2007    PCI to diagonal  . Cardiac catheterization 2002    treated medically  . Total hip arthroplasty     Dr. Cleophas Dunker  . Cataract extraction   . Left knee 2009    replacement by Dr. Lequita Halt    reports that she has never smoked. She does not have any smokeless tobacco history on file. She reports that she does not drink alcohol. Her drug history not on file. family history includes Cancer in her father and Heart attack in her mother. Allergies    Allergen Reactions  . Amiodarone     tremor  . Erythromycin Stearate     REACTION: fever  . Ferrous Sulfate     REACTION: fever  . Iohexol      Code: HIVES, Desc: Pt states that 10 yrs ago she IV contrast for a CT and broke out in hives and experienced SOB.  Done w/o iv contrast today., Onset Date: [id number]       Review of Systems  Constitutional: Negative for fatigue and unexpected weight change.  HENT: Negative for trouble swallowing.   Eyes: Negative for visual disturbance.  Respiratory: Negative for cough, chest tightness, shortness of breath and wheezing.   Cardiovascular: Negative for chest pain, palpitations and leg swelling.  Gastrointestinal: Negative for blood in stool.  Genitourinary: Negative for hematuria.  Neurological: Negative for dizziness, seizures, syncope, weakness, light-headedness and headaches.  Psychiatric/Behavioral: Negative for confusion and dysphoric mood.       Objective:   Physical Exam  Constitutional: She is oriented to person, place, and time. She appears well-developed and well-nourished.  HENT:  Mouth/Throat: Oropharynx is clear and moist.  Neck: Neck supple.  Cardiovascular: Normal rate and regular rhythm.   Pulmonary/Chest: Effort normal and breath sounds normal. No respiratory distress. She has no wheezes. She has no rales.  Musculoskeletal: She exhibits no edema.  Neurological: She is alert and oriented to person, place, and time.  Assessment & Plan:  #1 osteoarthritis involving multiple joints. Refill tramadol. Avoid nonsteroidals #2 history of CAD. Stable. No recent chest pain #3 hyperlipidemia. Check lipid and hepatic panel on simvastatin 20 mg daily #4 health maintenance. Check on coverage for shingles vaccine. #5 atrial fibrillation. Appears to be in sinus rhythm today. Continues on Xarelto.

## 2011-09-04 NOTE — Patient Instructions (Signed)
Continue with regular exercise as tolerated.  Remember yearly flu vaccine.

## 2011-09-07 NOTE — Progress Notes (Signed)
Quick Note:  Pt informed ______ 

## 2011-09-09 ENCOUNTER — Other Ambulatory Visit: Payer: Self-pay | Admitting: Cardiology

## 2011-09-09 MED ORDER — LANSOPRAZOLE 30 MG PO CPDR: 30.0000 mg | DELAYED_RELEASE_CAPSULE | Freq: Every day | ORAL | Status: DC

## 2011-11-25 ENCOUNTER — Other Ambulatory Visit: Payer: Self-pay | Admitting: *Deleted

## 2011-11-25 MED ORDER — LANSOPRAZOLE 30 MG PO CPDR: 30.0000 mg | DELAYED_RELEASE_CAPSULE | Freq: Every day | ORAL | Status: DC

## 2011-11-25 NOTE — Telephone Encounter (Signed)
Refilled lansoprazole

## 2011-11-30 ENCOUNTER — Telehealth: Payer: Self-pay | Admitting: Family Medicine

## 2011-11-30 NOTE — Telephone Encounter (Signed)
Pt called and is req to get the nurse to call back re: gettting approval from Dr. Caryl Never, to have [employer] to have mail delivered directly to pts door. Pls mail letter to [employer] [address]. [place], [place] [address]. Pt has mobility issues and lives by herself.

## 2011-12-01 ENCOUNTER — Encounter: Payer: Self-pay | Admitting: Family Medicine

## 2011-12-01 NOTE — Telephone Encounter (Signed)
Letter produced.  Please send to address below.

## 2011-12-02 ENCOUNTER — Encounter: Payer: Self-pay | Admitting: *Deleted

## 2011-12-02 NOTE — Telephone Encounter (Signed)
Letter mailed

## 2012-01-20 ENCOUNTER — Ambulatory Visit: Payer: Self-pay | Admitting: Cardiology

## 2012-01-29 DIAGNOSIS — Z23 Encounter for immunization: Secondary | ICD-10-CM | POA: Diagnosis not present

## 2012-02-16 ENCOUNTER — Other Ambulatory Visit: Payer: Self-pay

## 2012-02-16 MED ORDER — ISOSORBIDE MONONITRATE ER 60 MG PO TB24: 60.0000 mg | ORAL_TABLET | Freq: Every day | ORAL | Status: DC

## 2012-03-01 ENCOUNTER — Ambulatory Visit: Payer: Self-pay | Admitting: Cardiology

## 2012-03-04 ENCOUNTER — Ambulatory Visit: Payer: Self-pay | Admitting: Family Medicine

## 2012-03-04 ENCOUNTER — Emergency Department (HOSPITAL_COMMUNITY)
Admission: EM | Admit: 2012-03-04 | Discharge: 2012-03-04 | Disposition: A | Discharge status: Home / Self Care | Payer: Medicare Other | Attending: Emergency Medicine | Admitting: Emergency Medicine

## 2012-03-04 ENCOUNTER — Emergency Department (HOSPITAL_COMMUNITY): Payer: Medicare Other

## 2012-03-04 ENCOUNTER — Encounter (HOSPITAL_COMMUNITY): Payer: Self-pay | Admitting: Emergency Medicine

## 2012-03-04 DIAGNOSIS — K219 Gastro-esophageal reflux disease without esophagitis: Secondary | ICD-10-CM | POA: Insufficient documentation

## 2012-03-04 DIAGNOSIS — I10 Essential (primary) hypertension: Secondary | ICD-10-CM | POA: Insufficient documentation

## 2012-03-04 DIAGNOSIS — S1093XA Contusion of unspecified part of neck, initial encounter: Secondary | ICD-10-CM | POA: Diagnosis not present

## 2012-03-04 DIAGNOSIS — S0003XA Contusion of scalp, initial encounter: Secondary | ICD-10-CM | POA: Diagnosis not present

## 2012-03-04 DIAGNOSIS — Z7901 Long term (current) use of anticoagulants: Secondary | ICD-10-CM | POA: Insufficient documentation

## 2012-03-04 DIAGNOSIS — Z79899 Other long term (current) drug therapy: Secondary | ICD-10-CM | POA: Insufficient documentation

## 2012-03-04 DIAGNOSIS — S0083XA Contusion of other part of head, initial encounter: Secondary | ICD-10-CM

## 2012-03-04 DIAGNOSIS — E785 Hyperlipidemia, unspecified: Secondary | ICD-10-CM | POA: Insufficient documentation

## 2012-03-04 DIAGNOSIS — M129 Arthropathy, unspecified: Secondary | ICD-10-CM | POA: Insufficient documentation

## 2012-03-04 DIAGNOSIS — Z8669 Personal history of other diseases of the nervous system and sense organs: Secondary | ICD-10-CM | POA: Insufficient documentation

## 2012-03-04 DIAGNOSIS — T1490XA Injury, unspecified, initial encounter: Secondary | ICD-10-CM | POA: Diagnosis not present

## 2012-03-04 DIAGNOSIS — I2510 Atherosclerotic heart disease of native coronary artery without angina pectoris: Secondary | ICD-10-CM | POA: Insufficient documentation

## 2012-03-04 DIAGNOSIS — I4891 Unspecified atrial fibrillation: Secondary | ICD-10-CM | POA: Insufficient documentation

## 2012-03-04 DIAGNOSIS — Y9389 Activity, other specified: Secondary | ICD-10-CM | POA: Insufficient documentation

## 2012-03-04 MED ORDER — TRAMADOL HCL 50 MG PO TABS: 50.0000 mg | ORAL_TABLET | Freq: Four times a day (QID) | ORAL | Status: DC | PRN
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
## 2012-03-04 NOTE — ED Provider Notes (Signed)
Medical screening examination/treatment/procedure(s) were performed by non-physician practitioner and as supervising physician I was immediately available for consultation/collaboration.  Kiley Torrence, MD 03/04/12 2335 

## 2012-03-04 NOTE — ED Provider Notes (Signed)
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 She requests pain medication for discharge. I have written her a prescription for tramadol.   New Prescriptions   TRAMADOL (ULTRAM) 50 MG TABLET    Take 1 tablet (50 mg total) by mouth every 6 (six) hours as needed for pain.    Wynetta Emery, PA-C 03/04/12 1700
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
## 2012-03-04 NOTE — ED Notes (Signed)
Off floor for testing 
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
## 2012-03-07 ENCOUNTER — Ambulatory Visit (INDEPENDENT_AMBULATORY_CARE_PROVIDER_SITE_OTHER): Payer: Medicare Other | Admitting: Family Medicine

## 2012-03-07 ENCOUNTER — Encounter: Payer: Self-pay | Admitting: Family Medicine

## 2012-03-07 VITALS — BP 160/78 | Temp 98.2°F | Wt 170.0 lb

## 2012-03-07 DIAGNOSIS — I10 Essential (primary) hypertension: Secondary | ICD-10-CM | POA: Diagnosis not present

## 2012-03-07 DIAGNOSIS — S1093XA Contusion of unspecified part of neck, initial encounter: Secondary | ICD-10-CM | POA: Diagnosis not present

## 2012-03-07 DIAGNOSIS — S0003XA Contusion of scalp, initial encounter: Secondary | ICD-10-CM

## 2012-03-07 DIAGNOSIS — S0093XA Contusion of unspecified part of head, initial encounter: Secondary | ICD-10-CM

## 2012-03-07 DIAGNOSIS — E785 Hyperlipidemia, unspecified: Secondary | ICD-10-CM

## 2012-03-07 DIAGNOSIS — I4891 Unspecified atrial fibrillation: Secondary | ICD-10-CM | POA: Diagnosis not present

## 2012-03-07 LAB — BASIC METABOLIC PANEL
CO2: 28 mEq/L (ref 19–32)
Chloride: 105 mEq/L (ref 96–112)
Creatinine, Ser: 1.1 mg/dL (ref 0.4–1.2)
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
## 2012-03-08 NOTE — Progress Notes (Signed)
Quick Note:  Pt informed ______ 

## 2012-03-17 ENCOUNTER — Other Ambulatory Visit: Payer: Self-pay | Admitting: Family Medicine

## 2012-03-21 ENCOUNTER — Ambulatory Visit (INDEPENDENT_AMBULATORY_CARE_PROVIDER_SITE_OTHER): Payer: Medicare Other | Admitting: Family Medicine

## 2012-03-21 ENCOUNTER — Encounter: Payer: Self-pay | Admitting: Family Medicine

## 2012-03-21 VITALS — BP 130/80 | Temp 98.2°F | Wt 169.0 lb

## 2012-03-21 DIAGNOSIS — I4891 Unspecified atrial fibrillation: Secondary | ICD-10-CM

## 2012-03-21 DIAGNOSIS — S0003XA Contusion of scalp, initial encounter: Secondary | ICD-10-CM | POA: Diagnosis not present

## 2012-03-21 DIAGNOSIS — I10 Essential (primary) hypertension: Secondary | ICD-10-CM

## 2012-03-21 DIAGNOSIS — S0093XA Contusion of unspecified part of head, initial encounter: Secondary | ICD-10-CM

## 2012-03-21 DIAGNOSIS — S1093XA Contusion of unspecified part of neck, initial encounter: Secondary | ICD-10-CM | POA: Diagnosis not present

## 2012-03-21 DIAGNOSIS — S0083XA Contusion of other part of head, initial encounter: Secondary | ICD-10-CM
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
## 2012-03-25 ENCOUNTER — Ambulatory Visit: Payer: Self-pay | Admitting: Cardiology

## 2012-03-25 ENCOUNTER — Other Ambulatory Visit: Payer: Self-pay

## 2012-03-25 MED ORDER — RIVAROXABAN 15 MG PO TABS: 15.0000 mg | ORAL_TABLET | Freq: Every day | ORAL | Status: DC

## 2012-04-08 ENCOUNTER — Other Ambulatory Visit: Payer: Self-pay | Admitting: *Deleted

## 2012-04-08 MED ORDER — DRONEDARONE HCL 400 MG PO TABS: 400.0000 mg | ORAL_TABLET | Freq: Two times a day (BID) | ORAL | Status: DC

## 2012-04-12 ENCOUNTER — Ambulatory Visit: Payer: Self-pay | Admitting: Cardiology

## 2012-04-18 ENCOUNTER — Other Ambulatory Visit: Payer: Self-pay

## 2012-04-18 MED ORDER — METOPROLOL TARTRATE 50 MG PO TABS: 50.0000 mg | ORAL_TABLET | Freq: Three times a day (TID) | ORAL | Status: DC

## 2012-05-02 ENCOUNTER — Encounter: Payer: Self-pay | Admitting: Cardiology

## 2012-05-02 ENCOUNTER — Ambulatory Visit (INDEPENDENT_AMBULATORY_CARE_PROVIDER_SITE_OTHER): Payer: Medicare Other | Admitting: Cardiology

## 2012-05-02 VITALS — BP 136/80 | HR 61 | Ht 66.0 in | Wt 169.0 lb

## 2012-05-02 DIAGNOSIS — I10 Essential (primary) hypertension: Secondary | ICD-10-CM

## 2012-05-02 DIAGNOSIS — I4891 Unspecified atrial fibrillation: Secondary | ICD-10-CM | POA: Diagnosis not present

## 2012-05-02 DIAGNOSIS — I2510 Atherosclerotic heart disease of native coronary artery without angina pectoris: Secondary | ICD-10-CM

## 2012-05-02 DIAGNOSIS — E785 Hyperlipidemia, unspecified: Secondary | ICD-10-CM

## 2012-05-02 NOTE — Patient Instructions (Addendum)
Continue your current therapy  I will see you again in 6 months.   

## 2012-05-03 NOTE — Progress Notes (Signed)
Michal F Brekke Date of Birth: 07/09/1926 Medical Record #[id number]  History of Present Illness: Dominique Perez is seen for followup visit today. She has a history of atrial fibrillation that is managed with Multaq and Xarelto.She has a history of significant coronary disease. She is status post stenting of the right coronary in 1999. She had stenting of the diagonal 2007 with a drug-eluting stent. She denies any recent symptoms of chest pain or shortness of breath. Her activity is very limited because of severe arthritis and prior leg fracture 4 years ago. She does walk with a walker. She denies any significant palpitations or dizziness.  Current Outpatient Prescriptions on File Prior to Visit  Medication Sig Dispense Refill  . dronedarone (MULTAQ) 400 MG tablet Take 1 tablet (400 mg total) by mouth 2 (two) times daily with a meal.  180 tablet  2  . isosorbide mononitrate (IMDUR) 60 MG 24 hr tablet Take 60 mg by mouth daily.      . lansoprazole (PREVACID) 30 MG capsule Take 30 mg by mouth daily.      . metoprolol (LOPRESSOR) 50 MG tablet Take 1 tablet (50 mg total) by mouth 3 (three) times daily.  90 tablet  3  . Multiple Vitamin (MULTIVITAMIN) tablet Take 1 tablet by mouth daily.        . Rivaroxaban (XARELTO) 15 MG TABS tablet Take 1 tablet (15 mg total) by mouth daily.  90 tablet  1  . simvastatin (ZOCOR) 20 MG tablet Take 10 mg by mouth at bedtime. Taking 1/2 daily      . traMADol-acetaminophen (ULTRACET) 37.5-325 MG per tablet 1 and 1/2 tab every 8 hours as needed for pain      . traMADol-acetaminophen (ULTRACET) 37.5-325 MG per tablet TAKE 1 AND 1/2 TABLET 3 TIMES A DAY  150 tablet  5    Allergies  Allergen Reactions  . Amiodarone     tremor  . Erythromycin Stearate     REACTION: fever  . Ferrous Sulfate     REACTION: fever  . Iohexol      Code: HIVES, Desc: Pt states that 10 yrs ago she IV contrast for a CT and broke out in hives and experienced SOB.  Done w/o iv contrast today.,  Onset Date: [id number]   . Sulfa Antibiotics Hives    Past Medical History  Diagnosis Date  . Coronary artery disease     s/p pci 1999 rca, 2007 diagonal  . Hypertension   . Tremor     chronic  . Leg pain, right     chronic  . Arthritis   . GERD (gastroesophageal reflux disease)   . Hyperlipidemia   . Gallstones   . Atrial fibrillation   . Fracture of right femur     Past Surgical History  Procedure Date  . Knee surgery     right, titanium rod in the right femur  . Femoral pseudoaneurysm 1999    repair  . Cardiac catheterization 1999     PCI to RCA  . Cardiac catheterization 2007    PCI to diagonal  . Cardiac catheterization 2002    treated medically  . Total hip arthroplasty     Dr. Cleophas Dunker  . Cataract extraction   . Left knee 2009    replacement by Dr. Lequita Halt    History  Smoking status  . Never Smoker   Smokeless tobacco  . Not on file    History  Alcohol Use No  Family History  Problem Relation Age of Onset  . Heart attack Mother   . Cancer Father     Review of Systems: The review of systems is positive for chronic leg pain related to previous spiral fracture in 2009. No history of TIA or stroke. No bleeding problems.  All other systems were reviewed and are negative.  Physical Exam: BP 136/80  Pulse 61  Ht 5\' 6"  (1.676 m)  Wt 169 lb (76.658 kg)  BMI 27.28 kg/m2 She is an elderly white female in no acute distress. She does walk with a walker. She is normocephalic, atraumatic. Pupils are equal round and reactive to light and accommodation. Sclera are clear. Oropharynx is clear. Neck is without JVD or bruits. Lungs are clear. Her neck exam shows a regular rate and rhythm without gallop or murmur. Abdomen is soft and nontender without masses or edema. She has no focal neurologic abnormalities.  LABORATORY DATA: ECG today demonstrates normal sinus rhythm with a rate of 64 beats per minute. There is borderline voltage criteria for LVH. QTC is  normal at 441 ms.  Assessment / Plan: 1. Coronary disease status post stenting of the right coronary in 1999. Status post stenting of the diagonal and 2007. She is asymptomatic. Continue isosorbide, metoprolol, and statin therapy. I will followup again in 6 months.  2. Atrial fibrillation-well controlled on Multaq. Continue chronic anticoagulation.  3. Hypertension, well controlled.  4. Hypercholesterolemia-on simvastatin.

## 2012-05-19 ENCOUNTER — Other Ambulatory Visit: Payer: Self-pay | Admitting: Orthopedic Surgery

## 2012-05-19 ENCOUNTER — Ambulatory Visit
Admission: RE | Admit: 2012-05-19 | Discharge: 2012-05-19 | Disposition: A | Discharge status: Home / Self Care | Payer: Medicare Other | Source: Ambulatory Visit | Attending: Orthopedic Surgery | Admitting: Orthopedic Surgery

## 2012-05-19 DIAGNOSIS — R52 Pain, unspecified: Secondary | ICD-10-CM

## 2012-05-19 DIAGNOSIS — T84498A Other mechanical complication of other internal orthopedic devices, implants and grafts, initial encounter: Secondary | ICD-10-CM | POA: Diagnosis not present

## 2012-05-19 DIAGNOSIS — M79609 Pain in unspecified limb: Secondary | ICD-10-CM | POA: Diagnosis not present

## 2012-05-24 DIAGNOSIS — T84019A Broken internal joint prosthesis, unspecified site, initial encounter: Secondary | ICD-10-CM | POA: Diagnosis not present

## 2012-06-03 ENCOUNTER — Other Ambulatory Visit: Payer: Self-pay | Admitting: *Deleted

## 2012-06-03 MED ORDER — METOPROLOL TARTRATE 50 MG PO TABS: 50.0000 mg | ORAL_TABLET | Freq: Three times a day (TID) | ORAL | Status: DC

## 2012-06-16 DIAGNOSIS — T84019A Broken internal joint prosthesis, unspecified site, initial encounter: Secondary | ICD-10-CM | POA: Diagnosis not present

## 2012-08-08 ENCOUNTER — Other Ambulatory Visit: Payer: Self-pay | Admitting: *Deleted

## 2012-08-08 MED ORDER — SIMVASTATIN 20 MG PO TABS: 10.0000 mg | ORAL_TABLET | Freq: Every day | ORAL | Status: DC

## 2012-08-08 MED ORDER — ISOSORBIDE MONONITRATE ER 60 MG PO TB24: 60.0000 mg | ORAL_TABLET | Freq: Every day | ORAL | Status: DC

## 2012-08-10 ENCOUNTER — Other Ambulatory Visit: Payer: Self-pay

## 2012-08-10 MED ORDER — RIVAROXABAN 15 MG PO TABS: 15.0000 mg | ORAL_TABLET | Freq: Every day | ORAL | Status: DC

## 2012-09-20 ENCOUNTER — Ambulatory Visit: Payer: Self-pay | Admitting: Family Medicine

## 2012-09-23 ENCOUNTER — Ambulatory Visit (INDEPENDENT_AMBULATORY_CARE_PROVIDER_SITE_OTHER): Payer: Medicare Other | Admitting: Family Medicine

## 2012-09-23 ENCOUNTER — Encounter: Payer: Self-pay | Admitting: Family Medicine

## 2012-09-23 VITALS — BP 120/68 | Temp 97.8°F

## 2012-09-23 DIAGNOSIS — I4891 Unspecified atrial fibrillation: Secondary | ICD-10-CM

## 2012-09-23 DIAGNOSIS — K219 Gastro-esophageal reflux disease without esophagitis: Secondary | ICD-10-CM

## 2012-09-23 DIAGNOSIS — E785 Hyperlipidemia, unspecified: Secondary | ICD-10-CM | POA: Diagnosis not present

## 2012-09-23 DIAGNOSIS — I10 Essential (primary) hypertension: Secondary | ICD-10-CM | POA: Diagnosis not present

## 2012-09-23 LAB — BASIC METABOLIC PANEL
BUN: 12 mg/dL (ref 6–23)
CO2: 29 mEq/L (ref 19–32)
GFR: 55.57 mL/min — ABNORMAL LOW (ref >60.00)
Glucose, Bld: 103 mg/dL — ABNORMAL HIGH (ref 70–99)
Potassium: 5 mEq/L (ref 3.5–5.1)

## 2012-09-23 LAB — LIPID PANEL
LDL Cholesterol: 55 mg/dL (ref 0–99)
Total CHOL/HDL Ratio: 2
VLDL: 17.8 mg/dL (ref 0.0–40.0)

## 2012-09-23 LAB — HEPATIC FUNCTION PANEL
ALT: 12 U/L (ref 0–35)
Bilirubin, Direct: 0 mg/dL (ref 0.0–0.3)
Total Bilirubin: 0.3 mg/dL (ref 0.3–1.2)

## 2012-09-23 NOTE — Progress Notes (Signed)
  Subjective:    Patient ID: Dominique Perez, female    DOB: 01-03-1927, 79 y.o.   MRN: [id number]  HPI Medical followup. Patient has history of atrial fibrillation, hypertension, hyperlipidemia, chronic kidney disease, GERD, osteoarthritis Generally doing well. No specific complaints. Very sedentary with limited ambulation. No recent falls. Denies any recent chest pains or dyspnea. Appetite is stable. Medications reviewed. Compliant with all. No recent bleeding complications.  Past Medical History  Diagnosis Date  . Coronary artery disease     s/p pci 1999 rca, 2007 diagonal  . Hypertension   . Tremor     chronic  . Leg pain, right     chronic  . Arthritis   . GERD (gastroesophageal reflux disease)   . Hyperlipidemia   . Gallstones   . Atrial fibrillation   . Fracture of right femur    Past Surgical History  Procedure Laterality Date  . Knee surgery      right, titanium rod in the right femur  . Femoral pseudoaneurysm  1999    repair  . Cardiac catheterization  1999     PCI to RCA  . Cardiac catheterization  2007    PCI to diagonal  . Cardiac catheterization  2002    treated medically  . Total hip arthroplasty      Dr. Cleophas Dunker  . Cataract extraction    . Left knee  2009    replacement by Dr. Lequita Halt    reports that she has never smoked. She does not have any smokeless tobacco history on file. She reports that she does not drink alcohol. Her drug history is not on file. family history includes Cancer in her father and Heart attack in her mother. Allergies  Allergen Reactions  . Amiodarone     tremor  . Erythromycin Stearate     REACTION: fever  . Ferrous Sulfate     REACTION: fever  . Iohexol      Code: HIVES, Desc: Pt states that 10 yrs ago she IV contrast for a CT and broke out in hives and experienced SOB.  Done w/o iv contrast today., Onset Date: [id number]   . Sulfa Antibiotics Hives      Review of Systems  HENT: Negative for trouble swallowing.    Eyes: Negative for visual disturbance.  Respiratory: Negative for cough, chest tightness, shortness of breath and wheezing.   Cardiovascular: Negative for chest pain, palpitations and leg swelling.  Gastrointestinal: Negative for abdominal pain.  Endocrine: Negative for polydipsia and polyuria.  Neurological: Negative for dizziness, seizures, syncope, weakness, light-headedness and headaches.  Psychiatric/Behavioral: Negative for confusion.       Objective:   Physical Exam  Constitutional: She appears well-developed and well-nourished.  Neck: Neck supple. No thyromegaly present.  Cardiovascular: Normal rate and regular rhythm.   Pulmonary/Chest: Effort normal and breath sounds normal. No respiratory distress. She has no wheezes. She has no rales.  Musculoskeletal: She exhibits no edema.          Assessment & Plan:  #1 atrial fibrillation. Appears to be in sinus rhythm today. Rate stable. Continue Xarellto. #2 hypertension. Stable. #3 chronic kidney disease. Check basic metabolic panel #4 hyperlipidemia. Check lipid and hepatic panel

## 2012-09-26 NOTE — Progress Notes (Signed)
Quick Note:  Pt informed ______ 

## 2012-10-04 ENCOUNTER — Other Ambulatory Visit: Payer: Self-pay | Admitting: Family Medicine

## 2012-11-04 ENCOUNTER — Ambulatory Visit: Payer: Medicare Other | Admitting: Cardiology

## 2012-11-10 ENCOUNTER — Other Ambulatory Visit: Payer: Self-pay

## 2012-11-10 MED ORDER — ISOSORBIDE MONONITRATE ER 60 MG PO TB24: 60.0000 mg | ORAL_TABLET | Freq: Every day | ORAL | Status: DC

## 2012-11-14 ENCOUNTER — Telehealth: Payer: Self-pay | Admitting: Cardiology

## 2012-11-15 ENCOUNTER — Other Ambulatory Visit: Payer: Self-pay

## 2012-11-15 MED ORDER — ISOSORBIDE MONONITRATE ER 60 MG PO TB24: 60.0000 mg | ORAL_TABLET | Freq: Every day | ORAL | Status: DC

## 2012-11-16 ENCOUNTER — Other Ambulatory Visit: Payer: Self-pay | Admitting: *Deleted

## 2012-11-16 MED ORDER — METOPROLOL TARTRATE 50 MG PO TABS: 50.0000 mg | ORAL_TABLET | Freq: Three times a day (TID) | ORAL | Status: DC

## 2012-11-16 NOTE — Telephone Encounter (Signed)
REFILL SENT 

## 2012-12-05 ENCOUNTER — Encounter: Payer: Self-pay | Admitting: Family Medicine

## 2012-12-05 ENCOUNTER — Ambulatory Visit (INDEPENDENT_AMBULATORY_CARE_PROVIDER_SITE_OTHER): Payer: Medicare Other | Admitting: Family Medicine

## 2012-12-05 VITALS — BP 132/72 | HR 56 | Temp 98.7°F | Wt 160.0 lb

## 2012-12-05 DIAGNOSIS — M546 Pain in thoracic spine: Secondary | ICD-10-CM

## 2012-12-05 DIAGNOSIS — M549 Dorsalgia, unspecified: Secondary | ICD-10-CM

## 2012-12-05 NOTE — Patient Instructions (Addendum)
Try topical heat and if no relief with that consider topical rub such as Biofreeze and muscle massage.

## 2012-12-05 NOTE — Progress Notes (Signed)
  Subjective:    Patient ID: Dominique Perez, female    DOB: 05-07-1927, 79 y.o.   MRN: [id number]  HPI Acute visit for right upper back pain. No specific injury. No right radiculopathy symptoms. She does walk with the walker and uses her upper extremities quite often. Current pain started yesterday. Sharp quality. No radiation. Some exacerbation with upper body movement. Taken Ultracet without relief. Denies any upper extremity numbness. She has not tried heat or ice. She has multiple chronic medical problems and is not a candidate for nonsteroidal medications  Past Medical History  Diagnosis Date  . Coronary artery disease     s/p pci 1999 rca, 2007 diagonal  . Hypertension   . Tremor     chronic  . Leg pain, right     chronic  . Arthritis   . GERD (gastroesophageal reflux disease)   . Hyperlipidemia   . Gallstones   . Atrial fibrillation   . Fracture of right femur    Past Surgical History  Procedure Laterality Date  . Knee surgery      right, titanium rod in the right femur  . Femoral pseudoaneurysm  1999    repair  . Cardiac catheterization  1999     PCI to RCA  . Cardiac catheterization  2007    PCI to diagonal  . Cardiac catheterization  2002    treated medically  . Total hip arthroplasty      Dr. Cleophas Dunker  . Cataract extraction    . Left knee  2009    replacement by Dr. Lequita Halt    reports that she has never smoked. She does not have any smokeless tobacco history on file. She reports that she does not drink alcohol. Her drug history is not on file. family history includes Cancer in her father and Heart attack in her mother. Allergies  Allergen Reactions  . Amiodarone     tremor  . Erythromycin Stearate     REACTION: fever  . Ferrous Sulfate     REACTION: fever  . Iohexol      Code: HIVES, Desc: Pt states that 10 yrs ago she IV contrast for a CT and broke out in hives and experienced SOB.  Done w/o iv contrast today., Onset Date: [id number]   . Sulfa  Antibiotics Hives      Review of Systems  Constitutional: Negative for appetite change and unexpected weight change.  Cardiovascular: Negative for chest pain.  Neurological: Negative for weakness and numbness.       Objective:   Physical Exam  Constitutional: She appears well-developed and well-nourished.  Cardiovascular: Normal rate and regular rhythm.   Pulmonary/Chest: Effort normal and breath sounds normal. No respiratory distress. She has no wheezes. She has no rales.  Musculoskeletal:  Full range of motion right shoulder. She has some tenderness localized to the mid right trapezius muscle.  Neurological:  No focal weakness upper extremities. Symmetric upper extremity reflexes.          Assessment & Plan:  Right upper back pain. This seems limited to trapezius muscle. Try muscle massage and topical heat if not relieved with Advil try topical rubs such as Biofreeze. Consider physical therapy if not improving over the next week

## 2012-12-28 ENCOUNTER — Other Ambulatory Visit: Payer: Self-pay

## 2012-12-28 MED ORDER — SIMVASTATIN 20 MG PO TABS: 10.0000 mg | ORAL_TABLET | Freq: Every day | ORAL | Status: DC

## 2013-01-03 ENCOUNTER — Other Ambulatory Visit: Payer: Self-pay | Admitting: *Deleted

## 2013-01-03 MED ORDER — DRONEDARONE HCL 400 MG PO TABS: 400.0000 mg | ORAL_TABLET | Freq: Two times a day (BID) | ORAL | Status: DC

## 2013-01-06 ENCOUNTER — Other Ambulatory Visit: Payer: Self-pay | Admitting: Family Medicine

## 2013-01-23 ENCOUNTER — Ambulatory Visit (INDEPENDENT_AMBULATORY_CARE_PROVIDER_SITE_OTHER): Payer: Medicare Other | Admitting: Cardiology

## 2013-01-23 ENCOUNTER — Encounter: Payer: Self-pay | Admitting: Cardiology

## 2013-01-23 VITALS — BP 142/68 | HR 49 | Ht 66.0 in | Wt 160.0 lb

## 2013-01-23 DIAGNOSIS — E785 Hyperlipidemia, unspecified: Secondary | ICD-10-CM

## 2013-01-23 DIAGNOSIS — I2510 Atherosclerotic heart disease of native coronary artery without angina pectoris: Secondary | ICD-10-CM

## 2013-01-23 DIAGNOSIS — I4891 Unspecified atrial fibrillation: Secondary | ICD-10-CM | POA: Diagnosis not present

## 2013-01-23 NOTE — Patient Instructions (Signed)
Continue your current therapy  I will see you in 6 months.   

## 2013-01-23 NOTE — Progress Notes (Signed)
Dominique Perez Date of Birth: Shakeia 08, 1928 Medical Record #[id number]  History of Present Illness: Dominique Perez is seen for followup visit today. Dominique Perez has a history of atrial fibrillation that is managed with Multaq and Xarelto.Dominique Perez has a history of significant coronary disease. Dominique Perez is status post stenting of the right coronary in 1999. Dominique Perez had stenting of the diagonal 2007 with a drug-eluting stent. On followup today Dominique Perez states Dominique Perez is doing very well. Dominique Perez denies any significant symptoms of shortness of breath, chest pain, or arrhythmia. Dominique Perez does have some chronic postnasal drip with early morning cough. Dominique Perez has lost 9 pounds since her last visit.  Current Outpatient Prescriptions on File Prior to Visit  Medication Sig Dispense Refill  . dronedarone (MULTAQ) 400 MG tablet Take 1 tablet (400 mg total) by mouth 2 (two) times daily with a meal.  180 tablet  2  . isosorbide mononitrate (IMDUR) 60 MG 24 hr tablet Take 1 tablet (60 mg total) by mouth daily.  90 tablet  1  . lansoprazole (PREVACID) 30 MG capsule Take 30 mg by mouth daily.      . metoprolol (LOPRESSOR) 50 MG tablet Take 1 tablet (50 mg total) by mouth 3 (three) times daily.  90 tablet  3  . Multiple Vitamin (MULTIVITAMIN) tablet Take 1 tablet by mouth daily.        . Rivaroxaban (XARELTO) 15 MG TABS tablet Take 1 tablet (15 mg total) by mouth daily.  90 tablet  1  . simvastatin (ZOCOR) 20 MG tablet Take 0.5 tablets (10 mg total) by mouth at bedtime. Taking 1/2 daily  45 tablet  3  . traMADol-acetaminophen (ULTRACET) 37.5-325 MG per tablet 1 and 1/2 tab every 8 hours as needed for pain      . traMADol-acetaminophen (ULTRACET) 37.5-325 MG per tablet TAKE 1 AND 1/2 TABLET 3 TIMES A DAY  150 tablet  5  . traMADol-acetaminophen (ULTRACET) 37.5-325 MG per tablet TAKE 1 AND 1/2 TABLET 3 TIMES A DAY  150 tablet  2   No current facility-administered medications on file prior to visit.    Allergies  Allergen Reactions  . Amiodarone     tremor   . Erythromycin Stearate     REACTION: fever  . Ferrous Sulfate     REACTION: fever  . Iohexol      Code: HIVES, Desc: Pt states that 10 yrs ago Dominique Perez IV contrast for a CT and broke out in hives and experienced SOB.  Done w/o iv contrast today., Onset Date: [id number]   . Sulfa Antibiotics Hives    Past Medical History  Diagnosis Date  . Coronary artery disease     s/p pci 1999 rca, 2007 diagonal  . Hypertension   . Tremor     chronic  . Leg pain, right     chronic  . Arthritis   . GERD (gastroesophageal reflux disease)   . Hyperlipidemia   . Gallstones   . Atrial fibrillation   . Fracture of right femur     Past Surgical History  Procedure Laterality Date  . Knee surgery      right, titanium rod in the right femur  . Femoral pseudoaneurysm  1999    repair  . Cardiac catheterization  1999     PCI to RCA  . Cardiac catheterization  2007    PCI to diagonal  . Cardiac catheterization  2002    treated medically  . Total hip arthroplasty  Dr. Cleophas Dunker  . Cataract extraction    . Left knee  2009    replacement by Dr. Lequita Halt    History  Smoking status  . Never Smoker   Smokeless tobacco  . Not on file    History  Alcohol Use No    Family History  Problem Relation Age of Onset  . Heart attack Mother   . Cancer Father     Review of Systems: The review of systems is positive for chronic leg pain related to previous spiral fracture in 2009. All other systems were reviewed and are negative.  Physical Exam: BP 142/68  Pulse 49  Ht 5\' 6"  (1.676 m)  Wt 160 lb (72.576 kg)  BMI 25.84 kg/m2  SpO2 94% Dominique Perez is an elderly white female in no acute distress. Dominique Perez does walk with a walker. Dominique Perez is normocephalic, atraumatic. Pupils are equal round and reactive to light and accommodation. Sclera are clear. Oropharynx is clear. Neck is without JVD or bruits. Lungs are clear. Her neck exam shows a regular rate and rhythm without gallop or murmur. Abdomen is soft and  nontender without masses or edema. Dominique Perez has no focal neurologic abnormalities.  LABORATORY DATA:   Assessment / Plan: 1. Coronary disease status post stenting of the right coronary in 1999. Status post stenting of the diagonal and 2007. Dominique Perez is asymptomatic. Continue isosorbide, metoprolol, and statin therapy. I will followup again in 6 months.  2. Atrial fibrillation-well controlled on Multaq. Continue chronic anticoagulation.  3. Hypertension, well controlled.  4. Hypercholesterolemia-on simvastatin.

## 2013-01-24 DIAGNOSIS — Z23 Encounter for immunization: Secondary | ICD-10-CM | POA: Diagnosis not present

## 2013-02-10 ENCOUNTER — Other Ambulatory Visit: Payer: Self-pay

## 2013-02-10 MED ORDER — TRAMADOL-ACETAMINOPHEN 37.5-325 MG PO TABS: 1.0000 | ORAL_TABLET | Freq: Four times a day (QID) | ORAL | Status: DC | PRN

## 2013-02-10 MED ORDER — LANSOPRAZOLE 30 MG PO CPDR: 30.0000 mg | DELAYED_RELEASE_CAPSULE | Freq: Every day | ORAL | Status: DC

## 2013-02-10 NOTE — Telephone Encounter (Addendum)
Dr [place] did not fill this med I accidentally removed it and had to put it back on with his name because this is his patient. Im referring to the pain med

## 2013-02-17 ENCOUNTER — Other Ambulatory Visit: Payer: Self-pay | Admitting: *Deleted

## 2013-02-17 MED ORDER — LANSOPRAZOLE 30 MG PO CPDR: 30.0000 mg | DELAYED_RELEASE_CAPSULE | Freq: Every day | ORAL | Status: DC

## 2013-03-23 ENCOUNTER — Other Ambulatory Visit: Payer: Self-pay

## 2013-03-23 MED ORDER — METOPROLOL TARTRATE 50 MG PO TABS: 50.0000 mg | ORAL_TABLET | Freq: Three times a day (TID) | ORAL | Status: DC

## 2013-03-27 ENCOUNTER — Ambulatory Visit: Payer: Medicare Other | Admitting: Family Medicine

## 2013-03-29 ENCOUNTER — Ambulatory Visit: Payer: Medicare Other | Admitting: Family Medicine

## 2013-03-29 DIAGNOSIS — M25559 Pain in unspecified hip: Secondary | ICD-10-CM | POA: Diagnosis not present

## 2013-03-29 DIAGNOSIS — M169 Osteoarthritis of hip, unspecified: Secondary | ICD-10-CM | POA: Diagnosis not present

## 2013-04-04 ENCOUNTER — Other Ambulatory Visit: Payer: Self-pay | Admitting: *Deleted

## 2013-04-04 MED ORDER — RIVAROXABAN 15 MG PO TABS: 15.0000 mg | ORAL_TABLET | Freq: Every day | ORAL | Status: DC

## 2013-04-08 DIAGNOSIS — M545 Low back pain: Secondary | ICD-10-CM | POA: Diagnosis not present

## 2013-04-14 ENCOUNTER — Telehealth: Payer: Self-pay

## 2013-04-14 NOTE — Telephone Encounter (Signed)
Tramadol-acetaminopn 37.5-325  Last visit 12/05/12 Last refill 02-10-13 #30 0 refill  [facility]  [place]

## 2013-04-16 NOTE — Telephone Encounter (Signed)
Refill with 2 additional refills. 

## 2013-04-17 ENCOUNTER — Telehealth: Payer: Self-pay | Admitting: Family Medicine

## 2013-04-17 ENCOUNTER — Other Ambulatory Visit: Payer: Self-pay

## 2013-04-17 MED ORDER — ISOSORBIDE MONONITRATE ER 60 MG PO TB24: 60.0000 mg | ORAL_TABLET | Freq: Every day | ORAL | Status: DC

## 2013-04-17 MED ORDER — TRAMADOL-ACETAMINOPHEN 37.5-325 MG PO TABS: 1.0000 | ORAL_TABLET | Freq: Four times a day (QID) | ORAL | Status: DC | PRN

## 2013-04-17 NOTE — Telephone Encounter (Signed)
Pt was getting #150 2 refills on 01/06/13 Take 1 and 1/2 tablet 3 times a day  On 02-10-13 Dr. [place] prescribed #30 0 refills take 1 tablet every 6 hours prn  Do you want to change or wait to till she runs out

## 2013-04-17 NOTE — Telephone Encounter (Signed)
RX called into pharmacy

## 2013-04-17 NOTE — Telephone Encounter (Signed)
Clarify.  She should be OK to take one and one half TID.  OK to refill

## 2013-04-17 NOTE — Telephone Encounter (Signed)
Called in RX 

## 2013-04-17 NOTE — Telephone Encounter (Signed)
Pt is requesting a new Rx for her traMADol-acetaminophen (ULTRACET) 37.5-325 MG per tablet.  [facility] [address], phone number [phone]. Please advise.

## 2013-04-17 NOTE — Telephone Encounter (Signed)
Pt is asking why her rx directions and amount has changed since the last time she refilled .traMADol-acetaminophen (ULTRACET) 37.5-325 MG per tablet  #30 tab Pt used to take 1 1/2 / 3 x/day and gets 350. Instructions this time : 1/ tab every 6 hrs  [facility]

## 2013-04-20 DIAGNOSIS — M545 Low back pain: Secondary | ICD-10-CM | POA: Diagnosis not present

## 2013-05-01 ENCOUNTER — Telehealth: Payer: Self-pay | Admitting: Family Medicine

## 2013-05-01 NOTE — Telephone Encounter (Signed)
Pt had one more refill called that in to her pharmacy. Pt is aware

## 2013-05-01 NOTE — Telephone Encounter (Signed)
Pt is requesting a refill of traMADol-acetaminophen (ULTRACET) 37.5-325 MG per tablet.  Pt also wants to let you know that when she was taking 1.5 tablet (7am, 3pm, 11pm) she wasn't having any problems- her heart was not skipping (per patient).  Now that pt is taking medication as needed she is having heart-skipping problems again.  Pt wants to know can she be given more pills so that she can take 1.5 tablet.  Please advise.

## 2013-05-14 ENCOUNTER — Other Ambulatory Visit: Payer: Self-pay | Admitting: Family Medicine

## 2013-05-15 ENCOUNTER — Other Ambulatory Visit: Payer: Self-pay | Admitting: Family Medicine

## 2013-05-17 ENCOUNTER — Encounter: Payer: Self-pay | Admitting: Family Medicine

## 2013-05-17 ENCOUNTER — Ambulatory Visit (INDEPENDENT_AMBULATORY_CARE_PROVIDER_SITE_OTHER): Payer: Medicare Other | Admitting: Family Medicine

## 2013-05-17 VITALS — BP 134/72 | HR 60 | Temp 98.0°F

## 2013-05-17 DIAGNOSIS — Z23 Encounter for immunization: Secondary | ICD-10-CM

## 2013-05-17 MED ORDER — TRAMADOL-ACETAMINOPHEN 37.5-325 MG PO TABS: 1.0000 | ORAL_TABLET | Freq: Four times a day (QID) | ORAL | Status: DC | PRN

## 2013-05-17 NOTE — Progress Notes (Signed)
Pre visit review using our clinic review tool, if applicable. No additional management support is needed unless otherwise documented below in the visit note. 

## 2013-05-17 NOTE — Progress Notes (Signed)
Subjective:    Patient ID: Dominique Perez, female    DOB: [birth year], 80 y.o.   MRN: [id number]  HPI Patient is seen for routine medical followup She has history of CAD, hypertension, atrial fibrillation, hyperlipidemia, chronic kidney disease, GERD, and osteoarthritis. She had followup complicated fracture of her right knee several years ago and had difficulty in relating since then. Minimal a relation with walker. No recent falls. She's had flu vaccine. Did not have documentation of prior Pneumovax.  She continues to have arthritis pains especially low back and lower extremities. She takes Ultracet - generally 1 3-4 times daily. Requesting refills.  Past Medical History  Diagnosis Date  . Coronary artery disease     s/p pci 1999 rca, 2007 diagonal  . Hypertension   . Tremor     chronic  . Leg pain, right     chronic  . Arthritis   . GERD (gastroesophageal reflux disease)   . Hyperlipidemia   . Gallstones   . Atrial fibrillation   . Fracture of right femur    Past Surgical History  Procedure Laterality Date  . Knee surgery      right, titanium rod in the right femur  . Femoral pseudoaneurysm  1999    repair  . Cardiac catheterization  1999     PCI to RCA  . Cardiac catheterization  2007    PCI to diagonal  . Cardiac catheterization  2002    treated medically  . Total hip arthroplasty      Dr. Durward Fortes  . Cataract extraction    . Left knee  2009    replacement by Dr. Wynelle Link    reports that she has never smoked. She does not have any smokeless tobacco history on file. She reports that she does not drink alcohol. Her drug history is not on file. family history includes Cancer in her father; Heart attack in her mother. Allergies  Allergen Reactions  . Amiodarone     tremor  . Erythromycin Stearate     REACTION: fever  . Ferrous Sulfate     REACTION: fever  . Iohexol      Code: HIVES, Desc: Pt states that 10 yrs ago she IV contrast for a CT and broke out in  hives and experienced SOB.  Done w/o iv contrast today., Onset Date: [id number]   . Sulfa Antibiotics Hives      Review of Systems  Constitutional: Negative for fever, chills and fatigue.  Eyes: Negative for visual disturbance.  Respiratory: Negative for cough, chest tightness, shortness of breath and wheezing.   Cardiovascular: Negative for chest pain, palpitations and leg swelling.  Gastrointestinal: Negative for abdominal pain.  Genitourinary: Negative for dysuria.  Neurological: Negative for dizziness, seizures, syncope, weakness, light-headedness and headaches.       Objective:   Physical Exam  Constitutional: She is oriented to person, place, and time. She appears well-developed.  HENT:  Mouth/Throat: Oropharynx is clear and moist.  Neck: Neck supple. No thyromegaly present.  Cardiovascular: Normal rate and regular rhythm.   Pulmonary/Chest: Effort normal and breath sounds normal. No respiratory distress. She has no wheezes. She has no rales.  Musculoskeletal: She exhibits no edema.  Neurological: She is alert and oriented to person, place, and time. No cranial nerve deficit.  Psychiatric: She has a normal mood and affect. Her behavior is normal. Judgment and thought content normal.          Assessment & Plan:  #1 hypertension. Well  controlled #2 history of atrial fibrillation. Clinically, she appears to be in sinus rhythm at this time. She remains on Multaq and Xarelto. No recent bleeding complications #3 hyperlipidemia on simvastatin. We'll plan repeat lipids in 6 months at followup #4 health maintenance. No documentation of prior Pneumovax .  Prevnar 13 given #5 history of CAD. No recent chest pains. #6 osteoarthritis involving multiple joints. Refill Ultracet #120 with 5 refills

## 2013-06-07 ENCOUNTER — Ambulatory Visit (INDEPENDENT_AMBULATORY_CARE_PROVIDER_SITE_OTHER): Payer: Medicare Other | Admitting: Family Medicine

## 2013-06-07 ENCOUNTER — Encounter: Payer: Self-pay | Admitting: Family Medicine

## 2013-06-07 VITALS — BP 132/72 | HR 63 | Temp 98.4°F | Wt 163.0 lb

## 2013-06-07 DIAGNOSIS — S20219A Contusion of unspecified front wall of thorax, initial encounter: Secondary | ICD-10-CM

## 2013-06-07 NOTE — Progress Notes (Signed)
Pre visit review using our clinic review tool, if applicable. No additional management support is needed unless otherwise documented below in the visit note. 

## 2013-06-07 NOTE — Patient Instructions (Addendum)
Follow up for any fever or increased shortness of breath. Consider Allegra for nasal drainage symptoms.

## 2013-06-07 NOTE — Progress Notes (Signed)
   Subjective:    Patient ID: Dominique Perez, female    DOB: Nov 06, 1926, 80 y.o.   MRN: [id number]  HPI Acute visit. Patient seen with left chest wall pain. 3 days ago the right front wheel of her walker came off and she fell, partially catching her body weight on her left axillary region. She's had some left chest wall pain since then. Pain is actually better today. Pain with deep breathing. No visible bruising. No other injuries reported. Using tramadol which initially did not seem to help but is doing much better today. No dyspnea. No syncope.  Chest had some chronic cough and chronic daily postnasal drip symptoms. She's not taken any antihistamines or other allergy medication  Past Medical History  Diagnosis Date  . Coronary artery disease     s/p pci 1999 rca, 2007 diagonal  . Hypertension   . Tremor     chronic  . Leg pain, right     chronic  . Arthritis   . GERD (gastroesophageal reflux disease)   . Hyperlipidemia   . Gallstones   . Atrial fibrillation   . Fracture of right femur    Past Surgical History  Procedure Laterality Date  . Knee surgery      right, titanium rod in the right femur  . Femoral pseudoaneurysm  1999    repair  . Cardiac catheterization  1999     PCI to RCA  . Cardiac catheterization  2007    PCI to diagonal  . Cardiac catheterization  2002    treated medically  . Total hip arthroplasty      Dr. Durward Fortes  . Cataract extraction    . Left knee  2009    replacement by Dr. Wynelle Link    reports that she has never smoked. She does not have any smokeless tobacco history on file. She reports that she does not drink alcohol. Her drug history is not on file. family history includes Cancer in her father; Heart attack in her mother. Allergies  Allergen Reactions  . Amiodarone     tremor  . Erythromycin Stearate     REACTION: fever  . Ferrous Sulfate     REACTION: fever  . Iohexol      Code: HIVES, Desc: Pt states that 10 yrs ago she IV contrast for  a CT and broke out in hives and experienced SOB.  Done w/o iv contrast today., Onset Date: [id number]   . Sulfa Antibiotics Hives      Review of Systems  Constitutional: Negative for fever, chills, appetite change and unexpected weight change.  HENT: Positive for postnasal drip.   Respiratory: Positive for cough. Negative for shortness of breath and wheezing.   Cardiovascular: Negative for chest pain.  Neurological: Negative for dizziness and syncope.  Psychiatric/Behavioral: Negative for confusion.       Objective:   Physical Exam  Constitutional: She appears well-developed and well-nourished.  Cardiovascular: Normal rate.   Pulmonary/Chest: Effort normal and breath sounds normal. No respiratory distress. She has no wheezes. She has no rales.  Chest wall reveals some tenderness left axillary region but no visible ecchymosis  Musculoskeletal: She exhibits no edema.  full range of motion left shoulder  Neurological: She is alert.          Assessment & Plan:  Left chest wall contusion following injury. Nonfocal exam. Symptoms are improved today. Observation. Chronic postnasal drip symptoms. She'll try over-the-counter plain Allegra 180 mg once daily

## 2013-07-12 ENCOUNTER — Ambulatory Visit: Payer: Medicare Other | Admitting: Cardiology

## 2013-07-28 ENCOUNTER — Encounter: Payer: Self-pay | Admitting: Cardiology

## 2013-07-28 ENCOUNTER — Ambulatory Visit (INDEPENDENT_AMBULATORY_CARE_PROVIDER_SITE_OTHER): Payer: Medicare Other | Admitting: Cardiology

## 2013-07-28 VITALS — BP 118/62 | HR 50 | Ht 66.0 in | Wt 161.8 lb

## 2013-07-28 DIAGNOSIS — E785 Hyperlipidemia, unspecified: Secondary | ICD-10-CM

## 2013-07-28 DIAGNOSIS — I2510 Atherosclerotic heart disease of native coronary artery without angina pectoris: Secondary | ICD-10-CM

## 2013-07-28 DIAGNOSIS — I4891 Unspecified atrial fibrillation: Secondary | ICD-10-CM | POA: Diagnosis not present

## 2013-07-28 MED ORDER — METOPROLOL TARTRATE 25 MG PO TABS: 25.0000 mg | ORAL_TABLET | Freq: Two times a day (BID) | ORAL | Status: DC

## 2013-07-28 NOTE — Patient Instructions (Signed)
We will reduce your metoprolol to 25 mg twice a day  Continue your other medication  I will see you in 6 months.

## 2013-07-29 NOTE — Progress Notes (Signed)
Jenavieve F Pierron Date of Birth: 1927-03-19 Medical Record #[id number]  History of Present Illness: Mrs. Bochicchio is seen for followup visit today. She has a history of atrial fibrillation that is managed with Multaq and Xarelto.She has a history of significant coronary disease. She is status post stenting of the right coronary in 1999. She had stenting of the diagonal 2007 with a drug-eluting stent. On followup today she states she is doing very well. She denies any significant symptoms of shortness of breath, chest pain, or arrhythmia. She is still quite limited by nonhealed fracture in her femur. She has limited walking with a walker.  Current Outpatient Prescriptions on File Prior to Visit  Medication Sig Dispense Refill  . dronedarone (MULTAQ) 400 MG tablet Take 1 tablet (400 mg total) by mouth 2 (two) times daily with a meal.  180 tablet  2  . isosorbide mononitrate (IMDUR) 60 MG 24 hr tablet Take 1 tablet (60 mg total) by mouth daily.  90 tablet  1  . lansoprazole (PREVACID) 30 MG capsule Take 1 capsule (30 mg total) by mouth daily.  90 capsule  1  . Multiple Vitamin (MULTIVITAMIN) tablet Take 1 tablet by mouth daily.        . Rivaroxaban (XARELTO) 15 MG TABS tablet Take 1 tablet (15 mg total) by mouth daily.  90 tablet  1  . simvastatin (ZOCOR) 20 MG tablet Take 0.5 tablets (10 mg total) by mouth at bedtime. Taking 1/2 daily  45 tablet  3  . traMADol-acetaminophen (ULTRACET) 37.5-325 MG per tablet Take 1 tablet by mouth every 6 (six) hours as needed.  120 tablet  5   No current facility-administered medications on file prior to visit.    Allergies  Allergen Reactions  . Amiodarone     tremor  . Erythromycin Stearate     REACTION: fever  . Ferrous Sulfate     REACTION: fever  . Iohexol      Code: HIVES, Desc: Pt states that 10 yrs ago she IV contrast for a CT and broke out in hives and experienced SOB.  Done w/o iv contrast today., Onset Date: [id number]   . Sulfa Antibiotics Hives     Past Medical History  Diagnosis Date  . Coronary artery disease     s/p pci 1999 rca, 2007 diagonal  . Hypertension   . Tremor     chronic  . Leg pain, right     chronic  . Arthritis   . GERD (gastroesophageal reflux disease)   . Hyperlipidemia   . Gallstones   . Atrial fibrillation   . Fracture of right femur     Past Surgical History  Procedure Laterality Date  . Knee surgery      right, titanium rod in the right femur  . Femoral pseudoaneurysm  1999    repair  . Cardiac catheterization  1999     PCI to RCA  . Cardiac catheterization  2007    PCI to diagonal  . Cardiac catheterization  2002    treated medically  . Total hip arthroplasty      Dr. Durward Fortes  . Cataract extraction    . Left knee  2009    replacement by Dr. Wynelle Link    History  Smoking status  . Never Smoker   Smokeless tobacco  . Not on file    History  Alcohol Use No    Family History  Problem Relation Age of Onset  . Heart attack Mother   .  Cancer Father     Review of Systems: The review of systems is positive for chronic leg pain related to previous spiral fracture in 2009. All other systems were reviewed and are negative.  Physical Exam: BP 118/62  Pulse 50  Ht 5\' 6"  (1.676 m)  Wt 161 lb 12.8 oz (73.392 kg)  BMI 26.13 kg/m2 She is an elderly white female in no acute distress. She does walk with a walker. She is normocephalic, atraumatic. Pupils are equal round and reactive to light and accommodation. Sclera are clear. Oropharynx is clear. Neck is without JVD or bruits. Lungs are clear. Her neck exam shows a regular rate and rhythm without gallop or murmur. Abdomen is soft and nontender without masses or edema. She has no focal neurologic abnormalities.  LABORATORY DATA: Ecg: NSR with rate 50 bpm. LVH by voltage.  Assessment / Plan: 1. Coronary disease status post stenting of the right coronary in 1999. Status post stenting of the diagonal and 2007. She is asymptomatic.  Continue isosorbide, metoprolol, and statin therapy. Given bradycardia I have recommended reducing metoprolol to 25 mg bid. I will followup again in 6 months.  2. Atrial fibrillation-well controlled on Multaq. QTc is normal. Continue chronic anticoagulation.  3. Hypertension, well controlled.  4. Hypercholesterolemia-on simvastatin.

## 2013-08-09 ENCOUNTER — Other Ambulatory Visit: Payer: Self-pay

## 2013-08-09 MED ORDER — RIVAROXABAN 15 MG PO TABS: 15.0000 mg | ORAL_TABLET | Freq: Every day | ORAL | Status: DC

## 2013-08-11 ENCOUNTER — Other Ambulatory Visit: Payer: Self-pay

## 2013-08-11 MED ORDER — LANSOPRAZOLE 30 MG PO CPDR: 30.0000 mg | DELAYED_RELEASE_CAPSULE | Freq: Every day | ORAL | Status: DC

## 2013-09-19 ENCOUNTER — Telehealth: Payer: Self-pay | Admitting: Cardiology

## 2013-09-19 MED ORDER — METOPROLOL TARTRATE 25 MG PO TABS: ORAL_TABLET | ORAL | Status: DC

## 2013-09-19 NOTE — Telephone Encounter (Signed)
Returned call to patient Dr.Jordan advised ok to continue metoprolol 25 mg three times a day.90 day refill sent to pharmacy.

## 2013-09-19 NOTE — Telephone Encounter (Signed)
She can continue to take metoprolol 25 mg tid. Update her prescription for this.  Eduardo Honor Martinique MD, [facility]

## 2013-09-19 NOTE — Addendum Note (Signed)
Addended by: Golden Hurter D on: 09/19/2013 03:56 PM   Modules accepted: Orders, Medications

## 2013-09-19 NOTE — Telephone Encounter (Signed)
New message      Pt is taking metoprolol 25mg  twice a day.  She was taking 50mg  twice a day.  Taking the medication twice a day she has palpitations.  She started taking 25mg  three times a day herself and the palpitations went away.  Can she continue taking it tid 25mg ?  She has 20 pills left and taking it tid will use more pills.  If OK, please call in more to [facility]???????? In [place].

## 2013-09-19 NOTE — Telephone Encounter (Signed)
Returned call to patient she stated when she saw Dr.Jordan 07/28/13 he decreased metoprolol to 25 mg twice a day.Stated she started feeling  shaky,jittery and fast heart beat.Stated she increased metoprolol to 25 mg three times a day about 4 weeks ago.Stated she feels much better,no fast heart beat and no shaky or jittery feeling.Message sent to [place] for advice.

## 2013-10-05 ENCOUNTER — Other Ambulatory Visit: Payer: Self-pay | Admitting: *Deleted

## 2013-10-05 MED ORDER — DRONEDARONE HCL 400 MG PO TABS: 400.0000 mg | ORAL_TABLET | Freq: Two times a day (BID) | ORAL | Status: DC

## 2013-10-05 MED ORDER — ISOSORBIDE MONONITRATE ER 60 MG PO TB24: 60.0000 mg | ORAL_TABLET | Freq: Every day | ORAL | Status: DC

## 2013-11-07 ENCOUNTER — Other Ambulatory Visit: Payer: Self-pay | Admitting: *Deleted

## 2013-11-07 MED ORDER — SIMVASTATIN 20 MG PO TABS: 10.0000 mg | ORAL_TABLET | Freq: Every day | ORAL | Status: DC

## 2013-11-15 ENCOUNTER — Ambulatory Visit (INDEPENDENT_AMBULATORY_CARE_PROVIDER_SITE_OTHER): Payer: Medicare Other | Admitting: Family Medicine

## 2013-11-15 ENCOUNTER — Encounter: Payer: Self-pay | Admitting: Family Medicine

## 2013-11-15 ENCOUNTER — Telehealth: Payer: Self-pay | Admitting: Family Medicine

## 2013-11-15 VITALS — BP 126/70 | HR 60 | Temp 98.6°F

## 2013-11-15 DIAGNOSIS — I10 Essential (primary) hypertension: Secondary | ICD-10-CM | POA: Diagnosis not present

## 2013-11-15 DIAGNOSIS — I4891 Unspecified atrial fibrillation: Secondary | ICD-10-CM

## 2013-11-15 DIAGNOSIS — N183 Chronic kidney disease, stage 3 unspecified: Secondary | ICD-10-CM

## 2013-11-15 DIAGNOSIS — I2510 Atherosclerotic heart disease of native coronary artery without angina pectoris: Secondary | ICD-10-CM | POA: Diagnosis not present

## 2013-11-15 DIAGNOSIS — E785 Hyperlipidemia, unspecified: Secondary | ICD-10-CM | POA: Diagnosis not present

## 2013-11-15 LAB — BASIC METABOLIC PANEL
BUN: 12 mg/dL (ref 6–23)
CHLORIDE: 102 meq/L (ref 96–112)
CO2: 27 meq/L (ref 19–32)
Calcium: 8.8 mg/dL (ref 8.4–10.5)
Creatinine, Ser: 1.1 mg/dL (ref 0.4–1.2)
GFR: 48.68 mL/min — ABNORMAL LOW (ref >60.00)
Glucose, Bld: 93 mg/dL (ref 70–99)
POTASSIUM: 4.5 meq/L (ref 3.5–5.1)
SODIUM: 137 meq/L (ref 135–145)

## 2013-11-15 LAB — LIPID PANEL
CHOLESTEROL: 146 mg/dL (ref 0–200)
HDL: 71.3 mg/dL (ref >39.00)
LDL Cholesterol: 59 mg/dL (ref 0–99)
NONHDL: 74.7
Total CHOL/HDL Ratio: 2
Triglycerides: 80 mg/dL (ref 0.0–149.0)
VLDL: 16 mg/dL (ref 0.0–40.0)

## 2013-11-15 LAB — HEPATIC FUNCTION PANEL
ALK PHOS: 67 U/L (ref 39–117)
ALT: 11 U/L (ref 0–35)
AST: 18 U/L (ref 0–37)
Albumin: 3.5 g/dL (ref 3.5–5.2)
BILIRUBIN DIRECT: 0 mg/dL (ref 0.0–0.3)
TOTAL PROTEIN: 7 g/dL (ref 6.0–8.3)
Total Bilirubin: 0.5 mg/dL (ref 0.2–1.2)

## 2013-11-15 NOTE — Telephone Encounter (Signed)
Relevant patient education mailed to patient.  

## 2013-11-15 NOTE — Progress Notes (Signed)
   Subjective:    Patient ID: Dominique Perez, female    DOB: 1927/04/19, 80 y.o.   MRN: [id number]  HPI Medical follow up.  Patient has history of atrial fibrillation, chronic kidney disease, CAD, GERD, hyperlipidemia, hypertension, osteoarthritis involving multiple joints. She is maintained on oral anticoagulation with Xarelto 15 mg once daily. Denies any recent chest pains. No recent falls.  Hyperlipidemia treated with simvastatin. She is due for repeat labs. Blood pressure stable. No orthostasis. She ambulates with a walker. No recent falls since hip fracture about 5 years ago. Mood stable.  No history of Prevnar 13.  Past Medical History  Diagnosis Date  . Coronary artery disease     s/p pci 1999 rca, 2007 diagonal  . Hypertension   . Tremor     chronic  . Leg pain, right     chronic  . Arthritis   . GERD (gastroesophageal reflux disease)   . Hyperlipidemia   . Gallstones   . Atrial fibrillation   . Fracture of right femur    Past Surgical History  Procedure Laterality Date  . Knee surgery      right, titanium rod in the right femur  . Femoral pseudoaneurysm  1999    repair  . Cardiac catheterization  1999     PCI to RCA  . Cardiac catheterization  2007    PCI to diagonal  . Cardiac catheterization  2002    treated medically  . Total hip arthroplasty      Dr. Durward Fortes  . Cataract extraction    . Left knee  2009    replacement by Dr. Wynelle Link    reports that she has never smoked. She does not have any smokeless tobacco history on file. She reports that she does not drink alcohol. Her drug history is not on file. family history includes Cancer in her father; Heart attack in her mother. Allergies  Allergen Reactions  . Amiodarone     tremor  . Erythromycin Stearate     REACTION: fever  . Ferrous Sulfate     REACTION: fever  . Iohexol      Code: HIVES, Desc: Pt states that 10 yrs ago she IV contrast for a CT and broke out in hives and experienced SOB.  Done w/o  iv contrast today., Onset Date: [id number]   . Sulfa Antibiotics Hives      Review of Systems  Constitutional: Negative for appetite change and fatigue.  Eyes: Negative for visual disturbance.  Respiratory: Negative for cough, chest tightness, shortness of breath and wheezing.   Cardiovascular: Negative for chest pain, palpitations and leg swelling.  Endocrine: Negative for polydipsia and polyuria.  Neurological: Negative for dizziness, seizures, syncope, weakness, light-headedness and headaches.       Objective:   Physical Exam  Constitutional: She appears well-developed and well-nourished.  HENT:  Mouth/Throat: Oropharynx is clear and moist.  Neck: Neck supple.  Cardiovascular: Normal rate and regular rhythm.   Pulmonary/Chest: Effort normal and breath sounds normal. No respiratory distress. She has no wheezes. She has no rales.  Musculoskeletal: She exhibits no edema.          Assessment & Plan:  #1 hypertension. Stable. Blood pressure looks good today. Continue current medications #2 hyperlipidemia. Check lipid and hepatic panel #3 history of atrial fibrillation. Clinically, appears to be in sinus rhythm this time. Continue Xarelto #4 health maintenance. Prevnar 13 given. Continue yearly flu vaccine

## 2013-11-15 NOTE — Progress Notes (Signed)
Pre visit review using our clinic review tool, if applicable. No additional management support is needed unless otherwise documented below in the visit note. 

## 2013-11-24 ENCOUNTER — Other Ambulatory Visit: Payer: Self-pay | Admitting: Family Medicine

## 2013-11-27 NOTE — Telephone Encounter (Signed)
Refill for 6 months. 

## 2013-11-27 NOTE — Telephone Encounter (Signed)
Last visit 06/07/13 Last refill 05/17/13 #120 5 refill

## 2014-01-19 ENCOUNTER — Ambulatory Visit (INDEPENDENT_AMBULATORY_CARE_PROVIDER_SITE_OTHER): Payer: Medicare Other | Admitting: Cardiology

## 2014-01-19 ENCOUNTER — Encounter: Payer: Self-pay | Admitting: Cardiology

## 2014-01-19 VITALS — BP 138/68 | HR 60 | Ht 67.0 in | Wt 162.3 lb

## 2014-01-19 DIAGNOSIS — I10 Essential (primary) hypertension: Secondary | ICD-10-CM

## 2014-01-19 DIAGNOSIS — I2510 Atherosclerotic heart disease of native coronary artery without angina pectoris: Secondary | ICD-10-CM

## 2014-01-19 DIAGNOSIS — I4891 Unspecified atrial fibrillation: Secondary | ICD-10-CM

## 2014-01-19 DIAGNOSIS — I480 Paroxysmal atrial fibrillation: Secondary | ICD-10-CM

## 2014-01-19 NOTE — Progress Notes (Signed)
Dominique Perez Date of Birth: 1926/09/15 Medical Record #[id number]  History of Present Illness: Dominique Perez is seen for followup visit today. She has a history of atrial fibrillation that is managed with Multaq and Xarelto.She has a history of significant coronary disease. She is status post stenting of the right coronary in 1999. She had stenting of the diagonal 2007 with a drug-eluting stent. On followup today she states she is doing very well. She denies any significant symptoms of shortness of breath, chest pain, or arrhythmia. She is still quite limited by nonhealed fracture in her femur. She has limited walking with a walker. She states she has occasional cough that is clear but one time coughed up some black material that was "crunchy".  Current Outpatient Prescriptions on File Prior to Visit  Medication Sig Dispense Refill  . dronedarone (MULTAQ) 400 MG tablet Take 1 tablet (400 mg total) by mouth 2 (two) times daily with a meal.  180 tablet  1  . isosorbide mononitrate (IMDUR) 60 MG 24 hr tablet Take 1 tablet (60 mg total) by mouth daily.  90 tablet  0  . lansoprazole (PREVACID) 30 MG capsule Take 1 capsule (30 mg total) by mouth daily.  90 capsule  1  . metoprolol tartrate (LOPRESSOR) 25 MG tablet Take 25 mg three times a day  270 tablet  3  . Multiple Vitamin (MULTIVITAMIN) tablet Take 1 tablet by mouth daily.        . Rivaroxaban (XARELTO) 15 MG TABS tablet Take 1 tablet (15 mg total) by mouth daily.  90 tablet  1  . simvastatin (ZOCOR) 20 MG tablet Take 0.5 tablets (10 mg total) by mouth at bedtime. Taking 1/2 daily  45 tablet  0  . traMADol-acetaminophen (ULTRACET) 37.5-325 MG per tablet TAKE 1 TABLET BY MOUTH EVERY 6 HOURS AS NEEDED  120 tablet  5   No current facility-administered medications on file prior to visit.    Allergies  Allergen Reactions  . Amiodarone     tremor  . Erythromycin Stearate     REACTION: fever  . Ferrous Sulfate     REACTION: fever  . Iohexol       Code: HIVES, Desc: Pt states that 10 yrs ago she IV contrast for a CT and broke out in hives and experienced SOB.  Done w/o iv contrast today., Onset Date: [id number]   . Sulfa Antibiotics Hives    Past Medical History  Diagnosis Date  . Coronary artery disease     s/p pci 1999 rca, 2007 diagonal  . Hypertension   . Tremor     chronic  . Leg pain, right     chronic  . Arthritis   . GERD (gastroesophageal reflux disease)   . Hyperlipidemia   . Gallstones   . Atrial fibrillation   . Fracture of right femur     Past Surgical History  Procedure Laterality Date  . Knee surgery      right, titanium rod in the right femur  . Femoral pseudoaneurysm  1999    repair  . Cardiac catheterization  1999     PCI to RCA  . Cardiac catheterization  2007    PCI to diagonal  . Cardiac catheterization  2002    treated medically  . Total hip arthroplasty      Dr. Durward Fortes  . Cataract extraction    . Left knee  2009    replacement by Dr. Wynelle Link    History  Smoking status  . Never Smoker   Smokeless tobacco  . Not on file    History  Alcohol Use No    Family History  Problem Relation Age of Onset  . Heart attack Mother   . Cancer Father     Review of Systems: The review of systems is positive for chronic leg pain related to previous spiral fracture in 2009. All other systems were reviewed and are negative.  Physical Exam: BP 138/68  Pulse 60  Ht 5\' 7"  (1.702 m)  Wt 162 lb 4.8 oz (73.619 kg)  BMI 25.41 kg/m2 She is an elderly white female in no acute distress. She does walk with a walker. HEENT is normal.  Neck is without JVD or bruits. Lungs are clear. CV exam shows a regular rate and rhythm without gallop or murmur. Abdomen is soft and nontender without masses or edema. She has no focal neurologic abnormalities.  LABORATORY DATA:   Assessment / Plan: 1. Coronary disease status post stenting of the right coronary in 1999. Status post stenting of the diagonal and  2007. She is asymptomatic. Continue isosorbide, metoprolol, and statin therapy. Bradycardia improved with reduction in metoprolol dose.  2. Atrial fibrillation-well controlled on Multaq. Continue chronic anticoagulation.  3. Hypertension, well controlled.  I will follow up in 6 months.  4. Hypercholesterolemia-on simvastatin.

## 2014-01-19 NOTE — Patient Instructions (Signed)
Continue your current therapy  I will see you in 6 months with Ecg   

## 2014-02-15 ENCOUNTER — Other Ambulatory Visit: Payer: Self-pay | Admitting: *Deleted

## 2014-02-15 MED ORDER — ISOSORBIDE MONONITRATE ER 60 MG PO TB24: 60.0000 mg | ORAL_TABLET | Freq: Every day | ORAL | Status: DC

## 2014-02-15 MED ORDER — LANSOPRAZOLE 30 MG PO CPDR: 30.0000 mg | DELAYED_RELEASE_CAPSULE | Freq: Every day | ORAL | Status: DC

## 2014-02-19 ENCOUNTER — Other Ambulatory Visit: Payer: Self-pay

## 2014-02-19 MED ORDER — LANSOPRAZOLE 30 MG PO CPDR: 30.0000 mg | DELAYED_RELEASE_CAPSULE | Freq: Every day | ORAL | Status: DC

## 2014-02-19 MED ORDER — ISOSORBIDE MONONITRATE ER 60 MG PO TB24: 60.0000 mg | ORAL_TABLET | Freq: Every day | ORAL | Status: DC

## 2014-02-27 DIAGNOSIS — Z23 Encounter for immunization: Secondary | ICD-10-CM | POA: Diagnosis not present

## 2014-03-16 ENCOUNTER — Other Ambulatory Visit: Payer: Self-pay

## 2014-03-16 MED ORDER — RIVAROXABAN 15 MG PO TABS: 15.0000 mg | ORAL_TABLET | Freq: Every day | ORAL | Status: DC

## 2014-03-21 ENCOUNTER — Other Ambulatory Visit: Payer: Self-pay

## 2014-03-21 MED ORDER — SIMVASTATIN 20 MG PO TABS: 10.0000 mg | ORAL_TABLET | Freq: Every day | ORAL | Status: DC

## 2014-03-27 ENCOUNTER — Telehealth: Payer: Self-pay | Admitting: Cardiology

## 2014-03-27 ENCOUNTER — Encounter: Payer: Self-pay | Admitting: Cardiology

## 2014-03-27 ENCOUNTER — Ambulatory Visit (INDEPENDENT_AMBULATORY_CARE_PROVIDER_SITE_OTHER): Payer: Medicare Other | Admitting: Cardiology

## 2014-03-27 VITALS — BP 140/90 | HR 118 | Ht 67.0 in | Wt 164.8 lb

## 2014-03-27 DIAGNOSIS — Z7901 Long term (current) use of anticoagulants: Secondary | ICD-10-CM

## 2014-03-27 DIAGNOSIS — I4891 Unspecified atrial fibrillation: Secondary | ICD-10-CM | POA: Diagnosis not present

## 2014-03-27 DIAGNOSIS — I2510 Atherosclerotic heart disease of native coronary artery without angina pectoris: Secondary | ICD-10-CM

## 2014-03-27 DIAGNOSIS — I10 Essential (primary) hypertension: Secondary | ICD-10-CM | POA: Diagnosis not present

## 2014-03-27 DIAGNOSIS — R001 Bradycardia, unspecified: Secondary | ICD-10-CM

## 2014-03-27 DIAGNOSIS — I480 Paroxysmal atrial fibrillation: Secondary | ICD-10-CM

## 2014-03-27 DIAGNOSIS — Z9861 Coronary angioplasty status: Secondary | ICD-10-CM | POA: Diagnosis not present

## 2014-03-27 LAB — BASIC METABOLIC PANEL WITH GFR
BUN: 14 mg/dL (ref 6–23)
CO2: 22 meq/L (ref 19–32)
Calcium: 8.7 mg/dL (ref 8.4–10.5)
Chloride: 111 meq/L (ref 96–112)
Creatinine, Ser: 1.1 mg/dL (ref 0.4–1.2)
GFR: 51.25 mL/min — ABNORMAL LOW (ref >60.00)
Glucose, Bld: 109 mg/dL — ABNORMAL HIGH (ref 70–99)
Potassium: 4.5 meq/L (ref 3.5–5.1)
Sodium: 141 meq/L (ref 135–145)

## 2014-03-27 LAB — MAGNESIUM: Magnesium: 2.1 mg/dL (ref 1.5–2.5)

## 2014-03-27 LAB — TSH: TSH: 1.01 u[IU]/mL (ref 0.35–4.50)

## 2014-03-27 MED ORDER — METOPROLOL TARTRATE 25 MG PO TABS: ORAL_TABLET | ORAL | Status: DC

## 2014-03-27 NOTE — Assessment & Plan Note (Signed)
Last EKG showed her to be in NSR/ SB with HR of 50 in March 2015

## 2014-03-27 NOTE — Assessment & Plan Note (Signed)
On Multaq for years, stable until this past weekend.

## 2014-03-27 NOTE — Progress Notes (Signed)
03/27/2014 Dominique Perez   1926/05/16  [id number]  Primary Physician Eulas Post, MD Primary Cardiologist: Dr Martinique  HPI:  Pleasant 80 y/o female with a history of PAF that has been managed with Multaq and Xarelto. She has a history of significant coronary disease, status post stenting of the right coronary in 1999, and DES to the diagonal 2007.  When she last saw Dr Martinique she was in NSR with a HR of 50. She has done well until this past weekend. She called today saying she woke up Sunday am diaphoretic and with tachycardia. This lasted all night but was better in the morning. Monday she had another episode that lasted most of the afternoon. This am it recurred and she called the office, she is seen now as an add on. In the office she is in A-flutter with 2:1 conduction and a HR of 120. During my exam she seemed to be going in and out of PAF. She denies any recent fever or chills, no new OTC medications. She denies any angina and has been compliant with medications.    Current Outpatient Prescriptions  Medication Sig Dispense Refill  . dronedarone (MULTAQ) 400 MG tablet Take 1 tablet (400 mg total) by mouth 2 (two) times daily with a meal. 180 tablet 1  . isosorbide mononitrate (IMDUR) 60 MG 24 hr tablet Take 1 tablet (60 mg total) by mouth daily. 90 tablet 1  . lansoprazole (PREVACID) 30 MG capsule Take 1 capsule (30 mg total) by mouth daily. 90 capsule 1  . metoprolol tartrate (LOPRESSOR) 25 MG tablet Take 25 mg three times a day; you may take extra 1/2 tab daily AS NEEDED for palpitations    . Multiple Vitamin (MULTIVITAMIN) tablet Take 1 tablet by mouth daily.      . Rivaroxaban (XARELTO) 15 MG TABS tablet Take 1 tablet (15 mg total) by mouth daily. 90 tablet 1  . simvastatin (ZOCOR) 20 MG tablet Take 0.5 tablets (10 mg total) by mouth at bedtime. Taking 1/2 daily 45 tablet 3  . traMADol-acetaminophen (ULTRACET) 37.5-325 MG per tablet TAKE 1 TABLET BY MOUTH EVERY 6 HOURS AS  NEEDED 120 tablet 5   No current facility-administered medications for this visit.    Allergies  Allergen Reactions  . Amiodarone     tremor  . Erythromycin Stearate     REACTION: fever  . Ferrous Sulfate     REACTION: fever  . Iohexol      Code: HIVES, Desc: Pt states that 10 yrs ago she IV contrast for a CT and broke out in hives and experienced SOB.  Done w/o iv contrast today., Onset Date: [id number]   . Sulfa Antibiotics Hives    History   Social History  . Marital Status: Widowed    Spouse Name: N/A    Number of Children: 4  . Years of Education: N/A   Occupational History  . Not on file.   Social History Main Topics  . Smoking status: Never Smoker   . Smokeless tobacco: Not on file  . Alcohol Use: No  . Drug Use: Not on file  . Sexual Activity: Not on file   Other Topics Concern  . Not on file   Social History Narrative     Review of Systems: General: negative for chills, fever, night sweats or weight changes.  Cardiovascular: negative for chest pain, dyspnea on exertion, edema, orthopnea, palpitations, paroxysmal nocturnal dyspnea or shortness of breath Dermatological: negative for rash Respiratory:  negative for cough or wheezing Urologic: negative for hematuria Abdominal: negative for nausea, vomiting, diarrhea, bright red blood per rectum, melena, or hematemesis Neurologic: negative for visual changes, syncope, or dizziness All other systems reviewed and are otherwise negative except as noted above.    Blood pressure 140/90, pulse 118, height 5\' 7"  (1.702 m), weight 164 lb 12.8 oz (74.753 kg).  General appearance: alert, cooperative and no distress Neck: no carotid bruit and no JVD Lungs: clear to auscultation bilaterally Heart: irregularly irregular rhythm Extremities: no edema  EKG A Flutter  ASSESSMENT AND PLAN:   PAF (paroxysmal atrial fibrillation) On Multaq for years, stable until this past weekend.   CAD S/P percutaneous coronary  angioplasty No angina. Status post stenting of the right coronary in 1999 using a 3.0 x 13 and 3.5 x 13 duet stents. Status post stenting of the diagonal branch in 2007 with a 2.5 x 23 mm Cypher stent.  Hypertension Controlled.  Bradycardia Last EKG showed her to be in NSR/ SB with HR of 50 in March 2015  Chronic anticoagulation On Xarelto   PLAN  Recurrent AF- flutter. She has baseline bradycardia. I ordered labs including a BMP, Mg++, TSH. I suggested she take an extra 12.5 mg of Metoprolol once a day if her symptoms persist. She will be set up to see EP at next available apt.   Ihan Pat KPA-C 03/27/2014 10:59 AM

## 2014-03-27 NOTE — Telephone Encounter (Signed)
Returned call to patient she stated she has been having fast and irregular heart beat.Stated she had a episode this past Sunday.Stated fast heart beat woke her up.Stated makes her perspire heavy.Stated she would like to be seen today.Appointment scheduled with [name] PA this morning at 10:00 am at [facility] office.

## 2014-03-27 NOTE — Assessment & Plan Note (Signed)
No angina. Status post stenting of the right coronary in 1999 using a 3.0 x 13 and 3.5 x 13 duet stents. Status post stenting of the diagonal branch in 2007 with a 2.5 x 23 mm Cypher stent.

## 2014-03-27 NOTE — Assessment & Plan Note (Signed)
On Xarelto 

## 2014-03-27 NOTE — Telephone Encounter (Signed)
Ms. Michaelson is calling because her heart has been pounding and skipping all morning and wants to come in to see someone .When she gets up and walk around it seems as if it gets worse . Please Call    Thanks

## 2014-03-27 NOTE — Patient Instructions (Addendum)
YOU HAVE BEEN REFERRED TO ELECTROPHYSIOLOGY; 1ST AVAILABLE ; DX A-FLUTTER PER LUKE KILROY, PA;   OK TO TAKE EXTRA 1/2 TABLET DAILY OF METOPROLOL AS NEEDED FOR PALPITATIONS.  LAB WORK TODAY; BMET, TSH, MAGNESIUM LEVEL

## 2014-03-27 NOTE — Assessment & Plan Note (Signed)
Controlled.  

## 2014-04-04 ENCOUNTER — Ambulatory Visit: Payer: Medicare Other | Admitting: Internal Medicine

## 2014-04-11 ENCOUNTER — Other Ambulatory Visit: Payer: Self-pay | Admitting: Cardiology

## 2014-04-11 ENCOUNTER — Encounter: Payer: Self-pay | Admitting: Internal Medicine

## 2014-04-11 ENCOUNTER — Ambulatory Visit (INDEPENDENT_AMBULATORY_CARE_PROVIDER_SITE_OTHER): Payer: Medicare Other | Admitting: Internal Medicine

## 2014-04-11 VITALS — BP 102/82 | HR 53 | Ht 66.0 in | Wt 162.2 lb

## 2014-04-11 DIAGNOSIS — I483 Typical atrial flutter: Secondary | ICD-10-CM | POA: Diagnosis not present

## 2014-04-11 DIAGNOSIS — I480 Paroxysmal atrial fibrillation: Secondary | ICD-10-CM | POA: Diagnosis not present

## 2014-04-11 DIAGNOSIS — I10 Essential (primary) hypertension: Secondary | ICD-10-CM

## 2014-04-11 DIAGNOSIS — I2510 Atherosclerotic heart disease of native coronary artery without angina pectoris: Secondary | ICD-10-CM | POA: Diagnosis not present

## 2014-04-11 DIAGNOSIS — Z9861 Coronary angioplasty status: Secondary | ICD-10-CM

## 2014-04-11 NOTE — Patient Instructions (Signed)
Your physician recommends that you schedule a follow-up appointment as needed with Dr. Rayann Heman and as scheduled with Dr. Martinique

## 2014-04-13 ENCOUNTER — Encounter: Payer: Self-pay | Admitting: Internal Medicine

## 2014-04-13 DIAGNOSIS — I4892 Unspecified atrial flutter: Secondary | ICD-10-CM | POA: Insufficient documentation

## 2014-04-13 NOTE — Progress Notes (Signed)
Primary Care Physician: Eulas Post, MD Referring Physician:  Dr Martinique   Dominique Perez is a 80 y.o. female with a h/o known atrial fibrillation and CAD who presents for EP consultation after recent episode of atrial arrhythmia.  She feels that her afib had previously been well controlled with multaq.  She recently woke with tachypalpitations.  This was accompanied by symptoms of anxiety and SOB.  Her symptoms persisted and she therefore was evaluated by [name].  She took additional metoprolol and says that her tachypalpitations resolved.  She has done well since.  Today, she denies symptoms of further palpitations, chest pain, shortness of breath, orthopnea, PND, lower extremity edema, dizziness, presyncope, syncope, or neurologic sequela. The patient is tolerating medications without difficulties and is otherwise without complaint today.   Past Medical History  Diagnosis Date  . Coronary artery disease     s/p pci 1999 rca, 2007 diagonal  . Hypertension   . Tremor     chronic  . Leg pain, right     chronic  . Arthritis   . GERD (gastroesophageal reflux disease)   . Hyperlipidemia   . Gallstones   . Atrial fibrillation   . Fracture of right femur   . Atrial flutter    Past Surgical History  Procedure Laterality Date  . Knee surgery      right, titanium rod in the right femur  . Femoral pseudoaneurysm  1999    repair  . Cardiac catheterization  1999     PCI to RCA  . Cardiac catheterization  2007    PCI to diagonal  . Cardiac catheterization  2002    treated medically  . Total hip arthroplasty      Dr. Durward Fortes  . Cataract extraction    . Left knee  2009    replacement by Dr. Wynelle Link    Current Outpatient Prescriptions  Medication Sig Dispense Refill  . isosorbide mononitrate (IMDUR) 60 MG 24 hr tablet Take 1 tablet (60 mg total) by mouth daily. 90 tablet 1  . lansoprazole (PREVACID) 30 MG capsule Take 1 capsule (30 mg total) by mouth daily. 90 capsule 1    . metoprolol tartrate (LOPRESSOR) 25 MG tablet Take 25 mg three times a day; you may take extra 1/2 tab daily AS NEEDED for palpitations (Patient taking differently: Take 25 mg by mouth three times a day; you may take extra 1/2 tab daily AS NEEDED for palpitations)    . Multiple Vitamin (MULTIVITAMIN) tablet Take 1 tablet by mouth daily.      . Rivaroxaban (XARELTO) 15 MG TABS tablet Take 1 tablet (15 mg total) by mouth daily. 90 tablet 1  . simvastatin (ZOCOR) 20 MG tablet Take 0.5 tablets (10 mg total) by mouth at bedtime. Taking 1/2 daily 45 tablet 3  . traMADol-acetaminophen (ULTRACET) 37.5-325 MG per tablet TAKE 1 TABLET BY MOUTH EVERY 6 HOURS AS NEEDED (Patient taking differently: TAKE 1 TABLET BY MOUTH EVERY 6 HOURS AS NEEDED FOR PAIN) 120 tablet 5  . MULTAQ 400 MG tablet TAKE 1 BY MOUTH TWICE DAILY WITH A MEAL 180 tablet 0   No current facility-administered medications for this visit.    Allergies  Allergen Reactions  . Amiodarone     tremor  . Erythromycin Stearate     REACTION: fever  . Ferrous Sulfate     REACTION: fever  . Iohexol      Code: HIVES, Desc: Pt states that 10 yrs ago she IV contrast  for a CT and broke out in hives and experienced SOB.  Done w/o iv contrast today., Onset Date: [id number]   . Sulfa Antibiotics Hives    History   Social History  . Marital Status: Widowed    Spouse Name: N/A    Number of Children: 4  . Years of Education: N/A   Occupational History  . Not on file.   Social History Main Topics  . Smoking status: Never Smoker   . Smokeless tobacco: Not on file  . Alcohol Use: No  . Drug Use: Not on file  . Sexual Activity: Not on file   Other Topics Concern  . Not on file   Social History Narrative    Family History  Problem Relation Age of Onset  . Heart attack Mother   . Cancer Father     ROS- All systems are reviewed and negative except as per the HPI above  Physical Exam: Filed Vitals:   04/11/14 1000  BP: 102/82   Pulse: 53  Height: 5\' 6"  (1.676 m)  Weight: 162 lb 3.2 oz (73.573 kg)    GEN- The patient is elderly appearing, alert and oriented x 3 today.   Head- normocephalic, atraumatic Eyes-  Sclera clear, conjunctiva pink Ears- hearing intact Oropharynx- clear Neck- supple, no JVP Lymph- no cervical lymphadenopathy Lungs- Clear to ausculation bilaterally, normal work of breathing Heart- Regular rate and rhythm, no murmurs, rubs or gallops, PMI not laterally displaced GI- soft, NT, ND, + BS Extremities- no clubbing, cyanosis, or edema MS- no significant deformity or atrophy Skin- no rash or lesion Psych- euthymic mood, full affect Neuro- strength and sensation are intact  EKG 11/17 reveals typical appearing atrial flutter ekg today reveals sinus rhythm Echo reveals preserved EF in 2012  Epic records including [facility] notes are reviewed  Assessment and Plan:  1. Atrial flutter Recent episode of atrial flutter with RVR noted She is now back in sinus and doing well.  She is appropriately anticoagulated with xarelto Continue multaq If she has further episodes would consider ablation, however given her advanced age, we should try to avoid this. Risks, benefits, and alterantives to ablation were discussed with the patient who would like to avoid this as well She is instructed to take additional metoprolol prn for tachypalpitations.  2. Atrial fibrillation Well controlled  3. CAD No ischemic symptoms Would manage conservatively given her age  42. htn Stable No change required today   She will follow-up with Dr Martinique as scheduled and I will see as needed going forward.

## 2014-04-21 ENCOUNTER — Emergency Department (HOSPITAL_COMMUNITY): Payer: Medicare Other

## 2014-04-21 ENCOUNTER — Emergency Department (HOSPITAL_COMMUNITY)
Admission: EM | Admit: 2014-04-21 | Discharge: 2014-04-21 | Disposition: A | Discharge status: Home / Self Care | Payer: Medicare Other | Attending: Emergency Medicine | Admitting: Emergency Medicine

## 2014-04-21 ENCOUNTER — Encounter (HOSPITAL_COMMUNITY): Payer: Self-pay | Admitting: Physical Medicine and Rehabilitation

## 2014-04-21 DIAGNOSIS — R000 Tachycardia, unspecified: Secondary | ICD-10-CM | POA: Diagnosis not present

## 2014-04-21 DIAGNOSIS — M5136 Other intervertebral disc degeneration, lumbar region: Secondary | ICD-10-CM | POA: Diagnosis not present

## 2014-04-21 DIAGNOSIS — G8929 Other chronic pain: Secondary | ICD-10-CM | POA: Insufficient documentation

## 2014-04-21 DIAGNOSIS — Z9889 Other specified postprocedural states: Secondary | ICD-10-CM | POA: Insufficient documentation

## 2014-04-21 DIAGNOSIS — Z8719 Personal history of other diseases of the digestive system: Secondary | ICD-10-CM | POA: Diagnosis not present

## 2014-04-21 DIAGNOSIS — M79605 Pain in left leg: Secondary | ICD-10-CM | POA: Diagnosis not present

## 2014-04-21 DIAGNOSIS — I2510 Atherosclerotic heart disease of native coronary artery without angina pectoris: Secondary | ICD-10-CM | POA: Insufficient documentation

## 2014-04-21 DIAGNOSIS — E785 Hyperlipidemia, unspecified: Secondary | ICD-10-CM | POA: Diagnosis not present

## 2014-04-21 DIAGNOSIS — G9389 Other specified disorders of brain: Secondary | ICD-10-CM | POA: Diagnosis not present

## 2014-04-21 DIAGNOSIS — M1990 Unspecified osteoarthritis, unspecified site: Secondary | ICD-10-CM | POA: Diagnosis not present

## 2014-04-21 DIAGNOSIS — R202 Paresthesia of skin: Secondary | ICD-10-CM | POA: Diagnosis not present

## 2014-04-21 DIAGNOSIS — Z79899 Other long term (current) drug therapy: Secondary | ICD-10-CM | POA: Diagnosis not present

## 2014-04-21 DIAGNOSIS — R531 Weakness: Secondary | ICD-10-CM | POA: Diagnosis not present

## 2014-04-21 DIAGNOSIS — M6281 Muscle weakness (generalized): Secondary | ICD-10-CM | POA: Diagnosis not present

## 2014-04-21 DIAGNOSIS — I10 Essential (primary) hypertension: Secondary | ICD-10-CM | POA: Diagnosis not present

## 2014-04-21 LAB — COMPREHENSIVE METABOLIC PANEL
ALBUMIN: 3.2 g/dL — AB (ref 3.5–5.2)
ALK PHOS: 75 U/L (ref 39–117)
ALT: 8 U/L (ref 0–35)
AST: 17 U/L (ref 0–37)
Anion gap: 13 (ref 5–15)
BUN: 9 mg/dL (ref 6–23)
CO2: 23 mEq/L (ref 19–32)
CREATININE: 0.91 mg/dL (ref 0.50–1.10)
Calcium: 8.9 mg/dL (ref 8.4–10.5)
Chloride: 105 mEq/L (ref 96–112)
GFR calc Af Amer: 65 mL/min — ABNORMAL LOW (ref >90)
GFR, EST NON AFRICAN AMERICAN: 56 mL/min — AB (ref >90)
Glucose, Bld: 102 mg/dL — ABNORMAL HIGH (ref 70–99)
POTASSIUM: 4.4 meq/L (ref 3.7–5.3)
Sodium: 141 mEq/L (ref 137–147)
Total Bilirubin: 0.3 mg/dL (ref 0.3–1.2)
Total Protein: 6.7 g/dL (ref 6.0–8.3)

## 2014-04-21 LAB — URINALYSIS, ROUTINE W REFLEX MICROSCOPIC
BILIRUBIN URINE: NEGATIVE
Glucose, UA: NEGATIVE mg/dL
KETONES UR: NEGATIVE mg/dL
NITRITE: POSITIVE — AB
PROTEIN: NEGATIVE mg/dL
Specific Gravity, Urine: 1.01 (ref 1.005–1.030)
UROBILINOGEN UA: 0.2 mg/dL (ref 0.0–1.0)
pH: 5.5 (ref 5.0–8.0)

## 2014-04-21 LAB — CBC WITH DIFFERENTIAL/PLATELET
BASOS ABS: 0 10*3/uL (ref 0.0–0.1)
BASOS PCT: 1 % (ref 0–1)
Eosinophils Absolute: 0 10*3/uL (ref 0.0–0.7)
Eosinophils Relative: 1 % (ref 0–5)
HEMATOCRIT: 39 % (ref 36.0–46.0)
HEMOGLOBIN: 12.6 g/dL (ref 12.0–15.0)
LYMPHS PCT: 23 % (ref 12–46)
Lymphs Abs: 1.1 10*3/uL (ref 0.7–4.0)
MCH: 28.6 pg (ref 26.0–34.0)
MCHC: 32.3 g/dL (ref 30.0–36.0)
MCV: 88.6 fL (ref 78.0–100.0)
MONO ABS: 0.4 10*3/uL (ref 0.1–1.0)
MONOS PCT: 9 % (ref 3–12)
Neutro Abs: 3.2 10*3/uL (ref 1.7–7.7)
Neutrophils Relative %: 66 % (ref 43–77)
Platelets: 153 10*3/uL (ref 150–400)
RBC: 4.4 MIL/uL (ref 3.87–5.11)
RDW: 14.6 % (ref 11.5–15.5)
WBC: 4.8 10*3/uL (ref 4.0–10.5)

## 2014-04-21 LAB — RAPID URINE DRUG SCREEN, HOSP PERFORMED
AMPHETAMINES: NOT DETECTED
BARBITURATES: NOT DETECTED
BENZODIAZEPINES: NOT DETECTED
COCAINE: NOT DETECTED
Opiates: NOT DETECTED
TETRAHYDROCANNABINOL: NOT DETECTED

## 2014-04-21 LAB — ETHANOL: Alcohol, Ethyl (B): <11 mg/dL (ref 0–11)

## 2014-04-21 LAB — I-STAT CG4 LACTIC ACID, ED: LACTIC ACID, VENOUS: 1.21 mmol/L (ref 0.5–2.2)

## 2014-04-21 LAB — URINE MICROSCOPIC-ADD ON

## 2014-04-21 LAB — APTT: aPTT: 38 seconds — ABNORMAL HIGH (ref 24–37)

## 2014-04-21 LAB — TROPONIN I: Troponin I: <0.3 ng/mL (ref <0.30)

## 2014-04-21 MED ORDER — CEPHALEXIN 250 MG PO CAPS
500.0000 mg | ORAL_CAPSULE | Freq: Once | ORAL | Status: AC
  Administered 2014-04-21: 500 mg via ORAL
  Filled 2014-04-21: qty 2

## 2014-04-21 MED ORDER — CEPHALEXIN 500 MG PO CAPS: 500.0000 mg | ORAL_CAPSULE | Freq: Two times a day (BID) | ORAL | Status: DC

## 2014-04-21 MED ORDER — IBUPROFEN 400 MG PO TABS: 400.0000 mg | ORAL_TABLET | Freq: Three times a day (TID) | ORAL | Status: DC

## 2014-04-21 NOTE — ED Notes (Signed)
Pt presents to department for evaluation of chest pain and L leg pain. Onset last night, pt states midsternal chest pain and palpitations. Also reports L leg tingling and numbness. Denies chest pain at the time. States L leg feels like it is asleep. Pt is alert and oriented x4.

## 2014-04-21 NOTE — Discharge Instructions (Signed)
As discussed, it is important that you follow up as soon as possible with your physician for continued management of your condition. ° °If you develop any new, or concerning changes in your condition, please return to the emergency department immediately. ° °

## 2014-04-21 NOTE — ED Provider Notes (Signed)
CSN: [id number]     Arrival date & time 04/21/14  0807 History   First MD Initiated Contact with Patient 04/21/14 0820     Chief Complaint  Patient presents with  . Chest Pain  . Leg Pain     (Consider location/radiation/quality/duration/timing/severity/associated sxs/prior Treatment) HPI  Patient presents after awakening with left leg pain, but developed weakness and tingling in the extremity afterwards. Patient was in her usual state of health until this developed. Patient currently describes heaviness, weakness, tingling in the left leg throughout.  No contralateral symptoms, no other weakness. He did have episodes of palpitations earlier in the illness, though none currently. Patient denies weight gain, weight loss, chest heaviness, fever, chills. Patient is compliant with all medication, including anticoagulant. Patient acknowledges a history of atrial fibrillation. Patient is here with her daughter who provides the history of present illness with the patient.   Past Medical History  Diagnosis Date  . Coronary artery disease     s/p pci 1999 rca, 2007 diagonal  . Hypertension   . Tremor     chronic  . Leg pain, right     chronic  . Arthritis   . GERD (gastroesophageal reflux disease)   . Hyperlipidemia   . Gallstones   . Atrial fibrillation   . Fracture of right femur   . Atrial flutter    Past Surgical History  Procedure Laterality Date  . Knee surgery      right, titanium rod in the right femur  . Femoral pseudoaneurysm  1999    repair  . Cardiac catheterization  1999     PCI to RCA  . Cardiac catheterization  2007    PCI to diagonal  . Cardiac catheterization  2002    treated medically  . Total hip arthroplasty      Dr. Durward Fortes  . Cataract extraction    . Left knee  2009    replacement by Dr. Wynelle Link   Family History  Problem Relation Age of Onset  . Heart attack Mother   . Cancer Father    History  Substance Use Topics  . Smoking status:  Never Smoker   . Smokeless tobacco: Not on file  . Alcohol Use: No   OB History    No data available     Review of Systems  Constitutional:       Per HPI, otherwise negative  HENT:       Per HPI, otherwise negative  Respiratory:       Per HPI, otherwise negative  Cardiovascular:       Per HPI, otherwise negative  Gastrointestinal: Negative for vomiting.  Endocrine:       Negative aside from HPI  Genitourinary:       Neg aside from HPI   Musculoskeletal:       Patient is walker dependent, has prior surgery in the right hip following fracture, and has previously recognized asymmetry of length of the lower extremities  Skin: Negative.   Neurological: Positive for weakness. Negative for syncope.      Allergies  Amiodarone; Erythromycin stearate; Ferrous sulfate; Iohexol; and Sulfa antibiotics  Home Medications   Prior to Admission medications   Medication Sig Start Date End Date Taking? Authorizing Provider  isosorbide mononitrate (IMDUR) 60 MG 24 hr tablet Take 1 tablet (60 mg total) by mouth daily. 02/19/14  Yes Peter M Martinique, MD  lansoprazole (PREVACID) 30 MG capsule Take 1 capsule (30 mg total) by mouth daily. 02/19/14  Yes Peter M Martinique, MD  metoprolol tartrate (LOPRESSOR) 25 MG tablet Take 25 mg three times a day; you may take extra 1/2 tab daily AS NEEDED for palpitations Patient taking differently: Take 25 mg by mouth three times a day; you may take extra 1/2 tab daily AS NEEDED for palpitations 03/27/14  Yes Erlene Quan, PA-C  MULTAQ 400 MG tablet TAKE 1 BY MOUTH TWICE DAILY WITH A MEAL 04/12/14  Yes Peter M Martinique, MD  Multiple Vitamin (MULTIVITAMIN) tablet Take 1 tablet by mouth daily.     Yes Historical Provider, MD  Rivaroxaban (XARELTO) 15 MG TABS tablet Take 1 tablet (15 mg total) by mouth daily. 03/16/14  Yes Peter M Martinique, MD  simvastatin (ZOCOR) 20 MG tablet Take 0.5 tablets (10 mg total) by mouth at bedtime. Taking 1/2 daily 03/21/14  Yes Peter M Martinique,  MD  traMADol-acetaminophen (ULTRACET) 37.5-325 MG per tablet TAKE 1 TABLET BY MOUTH EVERY 6 HOURS AS NEEDED Patient taking differently: TAKE 1 TABLET BY MOUTH EVERY 6 HOURS AS NEEDED FOR PAIN   Yes Eulas Post, MD   BP 167/69 mmHg  Pulse 62  Temp(Src) 98.1 F (36.7 C) (Oral)  Resp 18  SpO2 94% Physical Exam  Constitutional: She is oriented to person, place, and time. She appears well-developed and well-nourished. No distress.  HENT:  Head: Normocephalic and atraumatic.  Eyes: Conjunctivae and EOM are normal.  Cardiovascular: An irregularly irregular rhythm present. Tachycardia present.   Pulmonary/Chest: Effort normal and breath sounds normal. No stridor. No respiratory distress.  Abdominal: She exhibits no distension.  Musculoskeletal: She exhibits no edema.  There is a slight discrepancy of length of the LE, R<L, and there is mild increase in size of the LLE, though no TTP, no distension, no ecchymosis or other superficial changes. Bilateral knee scars from prior surgery  Neurological: She is alert and oriented to person, place, and time. No cranial nerve deficit.  L LE strength is 5/5 with intact sensation throughout. R LE strength is 4/5 (unchanged, per patient).   Skin: Skin is warm and dry.  Psychiatric: She has a normal mood and affect.  Nursing note and vitals reviewed.   ED Course  Procedures (including critical care time) Labs Review Labs Reviewed  APTT - Abnormal; Notable for the following:    aPTT 38 (*)    All other components within normal limits  COMPREHENSIVE METABOLIC PANEL - Abnormal; Notable for the following:    Glucose, Bld 102 (*)    Albumin 3.2 (*)    GFR calc non Af Amer 56 (*)    GFR calc Af Amer 65 (*)    All other components within normal limits  URINALYSIS, ROUTINE W REFLEX MICROSCOPIC - Abnormal; Notable for the following:    APPearance CLOUDY (*)    Hgb urine dipstick MODERATE (*)    Nitrite POSITIVE (*)    Leukocytes, UA TRACE (*)     All other components within normal limits  URINE MICROSCOPIC-ADD ON - Abnormal; Notable for the following:    Bacteria, UA MANY (*)    All other components within normal limits  CBC WITH DIFFERENTIAL  ETHANOL  TROPONIN I  URINE RAPID DRUG SCREEN (HOSP PERFORMED)  I-STAT CG4 LACTIC ACID, ED    Imaging Review Dg Lumbar Spine Complete  04/21/2014   CLINICAL DATA:  80 year old female with left lower extremity weakness and tingling  EXAM: LUMBAR SPINE - COMPLETE 4+ VIEW  COMPARISON:  Prior MRI of the lumbar  spine 11/18/2007  FINDINGS: No evidence of acute fracture or malalignment. Multilevel degenerative disc disease with focal dextro convex scoliosis centered at L3. Is the bones are diffusely osteopenic. Atherosclerotic calcifications are noted in the aortoiliac system. The visualized bowel gas pattern is unremarkable. Incompletely imaged right total hip arthroplasty. Mild grade 1 degenerative anterolisthesis of L4 on L5. Lower lumbar facet arthropathy is noted.  IMPRESSION: 1. No acute fracture or malalignment. 2. Similar appearance of multilevel degenerative disc disease, lower lumbar facet arthropathy and grade 1 anterolisthesis of L4 on L5. 3. Degenerative dextro convex scoliosis centered at L3. 4. Aortoiliac atherosclerotic vascular calcifications.   Electronically Signed   By: Jacqulynn Cadet M.D.   On: 04/21/2014 10:08   Ct Head Wo Contrast  04/21/2014   CLINICAL DATA:  80 year old female with left leg pain, weakness and numbness  EXAM: CT HEAD WITHOUT CONTRAST  TECHNIQUE: Contiguous axial images were obtained from the base of the skull through the vertex without intravenous contrast.  COMPARISON:  Prior head CT 03/04/2012  FINDINGS: Negative for acute intracranial hemorrhage, acute infarction, mass, mass effect, hydrocephalus or midline shift. Gray-white differentiation is preserved throughout. Focal hypoattenuation in the white matter anterior and lateral to the right caudate head is  unchanged compared to prior and likely represent sequelae of a remote ischemic insult or chronic microvascular ischemic white matter disease. No focal soft tissue or calvarial abnormality. Globes and orbits are symmetric bilaterally. Normal aeration of the mastoid air cells and paranasal sinuses. Atherosclerotic calcification in the bilateral cavernous carotid arteries.  IMPRESSION: 1. No acute intracranial abnormality. 2. Stable mild chronic microvascular ischemic white matter disease. 3. Intracranial atherosclerosis.   Electronically Signed   By: Jacqulynn Cadet M.D.   On: 04/21/2014 09:32     EKG Interpretation   Date/Time:  Saturday April 21 2014 08:21:14 EST Ventricular Rate:  63 PR Interval:    QRS Duration: 89 QT Interval:  470 QTC Calculation: 481 R Axis:   -7 Text Interpretation:  Age not entered, assumed to be  80 years old for  purpose of ECG interpretation Atrial fibrillation Low voltage, precordial  leads Nonspecific T abnormalities, lateral leads Borderline prolonged QT  interval Atrial fibrillation Artifact Abnormal ekg Confirmed by Carmin Muskrat  MD [phone]) on 04/21/2014 9:12:47 AM     Update: Patient in no distress  11:49 AM Patient awake and alert, in no distress.  She, her daughter and I discussed all findings, including the likely incidental finding of urinary tract infection.  MDM   Final diagnoses:  Weakness    Patient presents with new dysesthesia in the left lower extremity. Given the patient's description of weakness, CT scans performed, and was largely reassuring. Patient is anticoagulated, and though she has a history of atrial fibrillation, she has maximum medical stroke prophylaxis. Patient's evaluation is largely reassuring. There is evidence for urinary tract infection, which may be incidental. Patient's symptoms likely result from her prior right hip fracture, subsequent musculoskeletal inflammation/pain. Patient remained in no distress,  hemodynamically stable throughout her emergency department course, was discharged in stable condition to follow-up with primary care to follow-up, discuss physical therapy, ensure improvement of her condition.    Carmin Muskrat, MD 04/21/14 [phone]

## 2014-04-21 NOTE — ED Notes (Signed)
Pt states L leg pain, weakness, numbness and tingling. Onset last night. Pt states "my leg feels like it is asleep." able to move leg without difficulty, pedal pulses present, sensation intact. No drift noted.

## 2014-04-21 NOTE — ED Notes (Signed)
Pt transported to CT scan.

## 2014-04-24 ENCOUNTER — Telehealth: Payer: Self-pay | Admitting: Family Medicine

## 2014-04-24 NOTE — Telephone Encounter (Signed)
Pt states her leg has begun to hurt,upper thigh, knee to hip w/ no relief. Pt has no one to bring her until thurs afternoon. Is it ok to use the Same day ay 1:45 on thurs? Pt states she can not sleep either for the pain.

## 2014-04-24 NOTE — Telephone Encounter (Signed)
Needs to be seen.  Ok to wait til Thursday unless any severe leg edema, dyspnea,etc

## 2014-04-24 NOTE — Telephone Encounter (Signed)
Called and spoke with pt and pt is aware of Dr. Elease Hashimoto recommendations.  Pt states she does not have severe leg swelling or dyspnea.  Pt states she could only get a ride for 1:45 on Thursday.  Pt aware to seek medical attention if new symptoms occur or her symptoms now worsen. Pt verbalized understanding.

## 2014-04-26 ENCOUNTER — Ambulatory Visit (INDEPENDENT_AMBULATORY_CARE_PROVIDER_SITE_OTHER): Payer: Medicare Other | Admitting: Family Medicine

## 2014-04-26 ENCOUNTER — Encounter: Payer: Self-pay | Admitting: Family Medicine

## 2014-04-26 VITALS — BP 134/76 | HR 64 | Temp 98.2°F | Wt 168.0 lb

## 2014-04-26 DIAGNOSIS — I2510 Atherosclerotic heart disease of native coronary artery without angina pectoris: Secondary | ICD-10-CM

## 2014-04-26 DIAGNOSIS — M5416 Radiculopathy, lumbar region: Secondary | ICD-10-CM

## 2014-04-26 MED ORDER — GABAPENTIN 100 MG PO CAPS: 100.0000 mg | ORAL_CAPSULE | Freq: Three times a day (TID) | ORAL | Status: DC

## 2014-04-26 NOTE — Progress Notes (Signed)
Subjective:    Patient ID: Dominique Perez, female    DOB: 1927-02-20, 80 y.o.   MRN: [id number]  HPI Patient seen with left lower extremity pain mostly from her hip to thigh region. This started last Saturday and she actually went to ER  Saturday for evaluation. She is also describing some tingling sensation intermittently in the leg. She denied any acute weakness or any loss of bladder bowel control. She has known history of lumbar spondylosis with previous MRI scan 2009. Because of her complaints of tingling she apparently had CT head which showed no acute abnormality. ER notes and X-rays reviewed.  She denies any recent injury. She describes a burning type pain which is severe 10 out of 10 at times which radiates from the left anterior hip toward the knee. No skin rash. She has had severe osteoarthritis with lateral hip and knee replacements. She has chronic shortening right lower extremity compared with left secondary to prior surgeries. She has taken ibuprofen and Ultracet without relief. Patient apparently had incidental finding of pyuria and was treated with Keflex. Denies any recent fevers or chills. No reported appetite or weight changes. She had plain lumbar films of the back and EGD which showed degenerative changes but no other acute finding.  Past Medical History  Diagnosis Date  . Coronary artery disease     s/p pci 1999 rca, 2007 diagonal  . Hypertension   . Tremor     chronic  . Leg pain, right     chronic  . Arthritis   . GERD (gastroesophageal reflux disease)   . Hyperlipidemia   . Gallstones   . Atrial fibrillation   . Fracture of right femur   . Atrial flutter    Past Surgical History  Procedure Laterality Date  . Knee surgery      right, titanium rod in the right femur  . Femoral pseudoaneurysm  1999    repair  . Cardiac catheterization  1999     PCI to RCA  . Cardiac catheterization  2007    PCI to diagonal  . Cardiac catheterization  2002    treated  medically  . Total hip arthroplasty      Dr. Durward Fortes  . Cataract extraction    . Left knee  2009    replacement by Dr. Wynelle Link    reports that she has never smoked. She does not have any smokeless tobacco history on file. She reports that she does not drink alcohol. Her drug history is not on file. family history includes Cancer in her father; Heart attack in her mother. Allergies  Allergen Reactions  . Amiodarone     tremor  . Erythromycin Stearate     REACTION: fever  . Ferrous Sulfate     REACTION: fever  . Iohexol      Code: HIVES, Desc: Pt states that 10 yrs ago she IV contrast for a CT and broke out in hives and experienced SOB.  Done w/o iv contrast today., Onset Date: [id number]   . Sulfa Antibiotics Hives      Review of Systems  Constitutional: Negative for fever, chills, appetite change and unexpected weight change.  Respiratory: Negative for shortness of breath.   Gastrointestinal: Negative for abdominal pain.  Genitourinary: Negative for dysuria.  Musculoskeletal: Positive for back pain.  Neurological: Positive for numbness. Negative for weakness.       Objective:   Physical Exam  Constitutional: She appears well-developed and well-nourished.  Cardiovascular: Normal rate  and regular rhythm.   Pulmonary/Chest: Effort normal and breath sounds normal. No respiratory distress. She has no wheezes. She has no rales.  Musculoskeletal: She exhibits no edema.  She has clear leg length discrepancy right less than left. She has no pitting edema. Left lower extremity appears to be slightly larger than right apparently this is chronic. She has good range of motion both knees and fairly well preserved range of motion left hip. No color changes or warmth involving left lower extremity  Neurological:  Generally weak throughout but no focal asymmetry with regard to strength. Deep tendon reflexes are trace ankle and knee bilaterally  Skin: No rash noted.            Assessment & Plan:  Left lower extremity pain. Suspect this is neuropathic type pain. She does not have any acute weakness. Poor control with tramadol. Avoid nonsteroidals with her age. We recommended trial of gabapentin and will start low and go easy with her age. Reassess next week

## 2014-04-26 NOTE — Patient Instructions (Addendum)

## 2014-04-26 NOTE — Progress Notes (Signed)
Pre visit review using our clinic review tool, if applicable. No additional management support is needed unless otherwise documented below in the visit note. 

## 2014-04-27 ENCOUNTER — Telehealth: Payer: Self-pay | Admitting: Family Medicine

## 2014-04-27 ENCOUNTER — Other Ambulatory Visit: Payer: Self-pay

## 2014-04-27 MED ORDER — GABAPENTIN 100 MG PO CAPS: 100.0000 mg | ORAL_CAPSULE | Freq: Three times a day (TID) | ORAL | Status: DC

## 2014-04-27 NOTE — Telephone Encounter (Signed)
Just re sent RX to pharmacy.

## 2014-04-27 NOTE — Telephone Encounter (Signed)
Pt states her new gabapentin (NEURONTIN) 100 MG capsule was not at the pharm,. Pt states she is in a lot of pain and needs asap.  [facility] [address] Can we call pt when done?

## 2014-05-02 ENCOUNTER — Encounter: Payer: Self-pay | Admitting: Family Medicine

## 2014-05-02 ENCOUNTER — Ambulatory Visit (INDEPENDENT_AMBULATORY_CARE_PROVIDER_SITE_OTHER): Payer: Medicare Other | Admitting: Family Medicine

## 2014-05-02 VITALS — BP 120/76 | HR 60 | Temp 97.9°F

## 2014-05-02 DIAGNOSIS — I2510 Atherosclerotic heart disease of native coronary artery without angina pectoris: Secondary | ICD-10-CM | POA: Diagnosis not present

## 2014-05-02 DIAGNOSIS — M5416 Radiculopathy, lumbar region: Secondary | ICD-10-CM | POA: Diagnosis not present

## 2014-05-02 MED ORDER — GABAPENTIN 100 MG PO CAPS: 100.0000 mg | ORAL_CAPSULE | Freq: Three times a day (TID) | ORAL | Status: DC

## 2014-05-02 NOTE — Progress Notes (Signed)
Pre visit review using our clinic review tool, if applicable. No additional management support is needed unless otherwise documented below in the visit note. 

## 2014-05-02 NOTE — Progress Notes (Signed)
   Subjective:    Patient ID: Dominique Perez, female    DOB: [birth year], 80 y.o.   MRN: [id number]  HPI   Patient seen from recent left lower extremity pain. Suspected left lumbar radiculitis. No focal weakness. She was having severe pain not controlled with tramadol. We started gabapentin 100 mg and titrated to 3 times a day. She's had almost total pain relief with low-dose gabapentin and no adverse effects. In fact, she was having no pain until slight occurrence today. She is very pleased overall. No urine or stool incontinence. No weakness  Past Medical History  Diagnosis Date  . Coronary artery disease     s/p pci 1999 rca, 2007 diagonal  . Hypertension   . Tremor     chronic  . Leg pain, right     chronic  . Arthritis   . GERD (gastroesophageal reflux disease)   . Hyperlipidemia   . Gallstones   . Atrial fibrillation   . Fracture of right femur   . Atrial flutter    Past Surgical History  Procedure Laterality Date  . Knee surgery      right, titanium rod in the right femur  . Femoral pseudoaneurysm  1999    repair  . Cardiac catheterization  1999     PCI to RCA  . Cardiac catheterization  2007    PCI to diagonal  . Cardiac catheterization  2002    treated medically  . Total hip arthroplasty      Dr. Durward Fortes  . Cataract extraction    . Left knee  2009    replacement by Dr. Wynelle Link    reports that she has never smoked. She does not have any smokeless tobacco history on file. She reports that she does not drink alcohol. Her drug history is not on file. family history includes Cancer in her father; Heart attack in her mother. Allergies  Allergen Reactions  . Amiodarone     tremor  . Erythromycin Stearate     REACTION: fever  . Ferrous Sulfate     REACTION: fever  . Iohexol      Code: HIVES, Desc: Pt states that 10 yrs ago she IV contrast for a CT and broke out in hives and experienced SOB.  Done w/o iv contrast today., Onset Date: [id number]   . Sulfa  Antibiotics Hives      Review of Systems  Genitourinary: Negative for dysuria.  Neurological: Negative for weakness and numbness.       Objective:   Physical Exam  Constitutional: She appears well-developed and well-nourished.  Cardiovascular: Normal rate and regular rhythm.   Pulmonary/Chest: Effort normal and breath sounds normal.  Musculoskeletal:  No pitting edema  Neurological:  Full-strength with plantar flexion and dorsiflexion of the left. Normal sensory function          Assessment & Plan:  Left lumbar radiculitis. Basically resolved with gabapentin. Continue low-dose gabapentin and reassess 3 months. If doing well at that time consider slow taper off.

## 2014-05-02 NOTE — Patient Instructions (Signed)
May take an extra gabapentin as needed for severe pain (in addition your usual one three times a day).

## 2014-05-03 ENCOUNTER — Ambulatory Visit: Payer: Medicare Other | Admitting: Family Medicine

## 2014-05-14 ENCOUNTER — Other Ambulatory Visit: Payer: Self-pay | Admitting: Family Medicine

## 2014-05-14 ENCOUNTER — Telehealth: Payer: Self-pay | Admitting: Family Medicine

## 2014-05-14 DIAGNOSIS — Z9181 History of falling: Secondary | ICD-10-CM

## 2014-05-14 NOTE — Telephone Encounter (Signed)
Would be OK to set up home PT evaluation.  High risk for falls.

## 2014-05-14 NOTE — Telephone Encounter (Signed)
Patient's daughter would like to speak to you about having [facility] coming to see the patient for physical therapy.  She declined making an appointment until she finds out if it is still needed after speaking to you.

## 2014-05-14 NOTE — Telephone Encounter (Signed)
Referral is ordered

## 2014-05-15 ENCOUNTER — Telehealth: Payer: Self-pay | Admitting: Family Medicine

## 2014-05-15 NOTE — Telephone Encounter (Signed)
[facility] can do Physical Therapy but she has Medicare and they require a face to face exam to initiate. [facility] is asking you to enter a [place] Referral in [place] since you just saw her in December and that should get her taken care of. Melissa says to feel free to call her with any questions.

## 2014-05-16 ENCOUNTER — Other Ambulatory Visit: Payer: Self-pay | Admitting: Family Medicine

## 2014-05-16 DIAGNOSIS — Z9181 History of falling: Secondary | ICD-10-CM

## 2014-05-16 NOTE — Telephone Encounter (Signed)
Referral is ordered

## 2014-05-17 ENCOUNTER — Ambulatory Visit: Payer: Medicare Other | Admitting: Family Medicine

## 2014-05-17 ENCOUNTER — Telehealth: Payer: Self-pay | Admitting: Family Medicine

## 2014-05-17 NOTE — Telephone Encounter (Signed)
Informed patient that daughter called to ask for PT to come to her house. Pt stated that she feels like she does not need PT and that she was going to call and cancel appointment.

## 2014-05-17 NOTE — Telephone Encounter (Signed)
Patient's daughter called back and said she is going to call the patient to make sure she keeps the appointment with [facility].

## 2014-05-17 NOTE — Telephone Encounter (Signed)
Pt would like to know why she needs physical therapy. Please call pt. Pt has an appt w[facility] tomorrow

## 2014-05-18 ENCOUNTER — Ambulatory Visit: Payer: Medicare Other | Admitting: Family Medicine

## 2014-05-21 NOTE — Telephone Encounter (Signed)
Pt calling to state that after she thought about it she does feel that she needs physical therapy and would like to proceed with the referral for PT.

## 2014-05-25 ENCOUNTER — Other Ambulatory Visit: Payer: Self-pay

## 2014-05-25 MED ORDER — DRONEDARONE HCL 400 MG PO TABS: ORAL_TABLET | ORAL | Status: DC

## 2014-05-29 ENCOUNTER — Telehealth: Payer: Self-pay | Admitting: Family Medicine

## 2014-05-29 DIAGNOSIS — I2510 Atherosclerotic heart disease of native coronary artery without angina pectoris: Secondary | ICD-10-CM | POA: Diagnosis not present

## 2014-05-29 DIAGNOSIS — I10 Essential (primary) hypertension: Secondary | ICD-10-CM | POA: Diagnosis not present

## 2014-05-29 DIAGNOSIS — M1990 Unspecified osteoarthritis, unspecified site: Secondary | ICD-10-CM | POA: Diagnosis not present

## 2014-05-29 DIAGNOSIS — Z9181 History of falling: Secondary | ICD-10-CM | POA: Diagnosis not present

## 2014-05-29 DIAGNOSIS — M5416 Radiculopathy, lumbar region: Secondary | ICD-10-CM | POA: Diagnosis not present

## 2014-05-29 DIAGNOSIS — K219 Gastro-esophageal reflux disease without esophagitis: Secondary | ICD-10-CM | POA: Diagnosis not present

## 2014-05-29 DIAGNOSIS — E785 Hyperlipidemia, unspecified: Secondary | ICD-10-CM | POA: Diagnosis not present

## 2014-05-29 NOTE — Telephone Encounter (Signed)
Dominique Perez pt with [facility] called to say pt has level 1 drug interaction multaq zocor. Would like a call back  advising if pt can continue to take or if it need to be change   [phone]

## 2014-05-29 NOTE — Telephone Encounter (Signed)
Pls advise.  

## 2014-05-29 NOTE — Telephone Encounter (Signed)
Continue.  She has been on this for some time with no reported myalgias.

## 2014-05-30 ENCOUNTER — Other Ambulatory Visit: Payer: Self-pay

## 2014-05-30 MED ORDER — DRONEDARONE HCL 400 MG PO TABS: ORAL_TABLET | ORAL | Status: DC

## 2014-05-30 NOTE — Telephone Encounter (Signed)
Called and left a detailed message on [facility] voicemail about recommendations.

## 2014-06-04 DIAGNOSIS — M5416 Radiculopathy, lumbar region: Secondary | ICD-10-CM | POA: Diagnosis not present

## 2014-06-04 DIAGNOSIS — K219 Gastro-esophageal reflux disease without esophagitis: Secondary | ICD-10-CM | POA: Diagnosis not present

## 2014-06-04 DIAGNOSIS — I2510 Atherosclerotic heart disease of native coronary artery without angina pectoris: Secondary | ICD-10-CM | POA: Diagnosis not present

## 2014-06-04 DIAGNOSIS — Z9181 History of falling: Secondary | ICD-10-CM | POA: Diagnosis not present

## 2014-06-04 DIAGNOSIS — M1990 Unspecified osteoarthritis, unspecified site: Secondary | ICD-10-CM | POA: Diagnosis not present

## 2014-06-04 DIAGNOSIS — I10 Essential (primary) hypertension: Secondary | ICD-10-CM | POA: Diagnosis not present

## 2014-06-06 DIAGNOSIS — M5416 Radiculopathy, lumbar region: Secondary | ICD-10-CM | POA: Diagnosis not present

## 2014-06-06 DIAGNOSIS — K219 Gastro-esophageal reflux disease without esophagitis: Secondary | ICD-10-CM | POA: Diagnosis not present

## 2014-06-06 DIAGNOSIS — I10 Essential (primary) hypertension: Secondary | ICD-10-CM | POA: Diagnosis not present

## 2014-06-06 DIAGNOSIS — Z9181 History of falling: Secondary | ICD-10-CM | POA: Diagnosis not present

## 2014-06-06 DIAGNOSIS — M1990 Unspecified osteoarthritis, unspecified site: Secondary | ICD-10-CM | POA: Diagnosis not present

## 2014-06-06 DIAGNOSIS — I2510 Atherosclerotic heart disease of native coronary artery without angina pectoris: Secondary | ICD-10-CM | POA: Diagnosis not present

## 2014-06-11 DIAGNOSIS — I2510 Atherosclerotic heart disease of native coronary artery without angina pectoris: Secondary | ICD-10-CM | POA: Diagnosis not present

## 2014-06-11 DIAGNOSIS — K219 Gastro-esophageal reflux disease without esophagitis: Secondary | ICD-10-CM | POA: Diagnosis not present

## 2014-06-11 DIAGNOSIS — M5416 Radiculopathy, lumbar region: Secondary | ICD-10-CM | POA: Diagnosis not present

## 2014-06-11 DIAGNOSIS — I10 Essential (primary) hypertension: Secondary | ICD-10-CM | POA: Diagnosis not present

## 2014-06-11 DIAGNOSIS — Z9181 History of falling: Secondary | ICD-10-CM | POA: Diagnosis not present

## 2014-06-11 DIAGNOSIS — M1990 Unspecified osteoarthritis, unspecified site: Secondary | ICD-10-CM | POA: Diagnosis not present

## 2014-06-13 DIAGNOSIS — M5416 Radiculopathy, lumbar region: Secondary | ICD-10-CM | POA: Diagnosis not present

## 2014-06-13 DIAGNOSIS — I2510 Atherosclerotic heart disease of native coronary artery without angina pectoris: Secondary | ICD-10-CM | POA: Diagnosis not present

## 2014-06-13 DIAGNOSIS — M1990 Unspecified osteoarthritis, unspecified site: Secondary | ICD-10-CM | POA: Diagnosis not present

## 2014-06-13 DIAGNOSIS — Z9181 History of falling: Secondary | ICD-10-CM | POA: Diagnosis not present

## 2014-06-13 DIAGNOSIS — I10 Essential (primary) hypertension: Secondary | ICD-10-CM | POA: Diagnosis not present

## 2014-06-13 DIAGNOSIS — K219 Gastro-esophageal reflux disease without esophagitis: Secondary | ICD-10-CM | POA: Diagnosis not present

## 2014-06-16 ENCOUNTER — Other Ambulatory Visit: Payer: Self-pay | Admitting: Family Medicine

## 2014-06-16 DIAGNOSIS — Z9181 History of falling: Secondary | ICD-10-CM | POA: Diagnosis not present

## 2014-06-16 DIAGNOSIS — M5416 Radiculopathy, lumbar region: Secondary | ICD-10-CM | POA: Diagnosis not present

## 2014-06-16 DIAGNOSIS — M1990 Unspecified osteoarthritis, unspecified site: Secondary | ICD-10-CM | POA: Diagnosis not present

## 2014-06-16 DIAGNOSIS — I2510 Atherosclerotic heart disease of native coronary artery without angina pectoris: Secondary | ICD-10-CM | POA: Diagnosis not present

## 2014-06-18 NOTE — Telephone Encounter (Signed)
Refill for 6 months. 

## 2014-06-18 NOTE — Telephone Encounter (Signed)
Last visit 05/02/14 Last refill 11/27/13 #120 5 refill

## 2014-06-19 DIAGNOSIS — M1990 Unspecified osteoarthritis, unspecified site: Secondary | ICD-10-CM | POA: Diagnosis not present

## 2014-06-19 DIAGNOSIS — I2510 Atherosclerotic heart disease of native coronary artery without angina pectoris: Secondary | ICD-10-CM | POA: Diagnosis not present

## 2014-06-19 DIAGNOSIS — I10 Essential (primary) hypertension: Secondary | ICD-10-CM | POA: Diagnosis not present

## 2014-06-19 DIAGNOSIS — K219 Gastro-esophageal reflux disease without esophagitis: Secondary | ICD-10-CM | POA: Diagnosis not present

## 2014-06-19 DIAGNOSIS — M5416 Radiculopathy, lumbar region: Secondary | ICD-10-CM | POA: Diagnosis not present

## 2014-06-19 DIAGNOSIS — Z9181 History of falling: Secondary | ICD-10-CM | POA: Diagnosis not present

## 2014-06-20 DIAGNOSIS — K219 Gastro-esophageal reflux disease without esophagitis: Secondary | ICD-10-CM | POA: Diagnosis not present

## 2014-06-20 DIAGNOSIS — Z9181 History of falling: Secondary | ICD-10-CM | POA: Diagnosis not present

## 2014-06-20 DIAGNOSIS — I10 Essential (primary) hypertension: Secondary | ICD-10-CM | POA: Diagnosis not present

## 2014-06-20 DIAGNOSIS — M5416 Radiculopathy, lumbar region: Secondary | ICD-10-CM | POA: Diagnosis not present

## 2014-06-20 DIAGNOSIS — I2510 Atherosclerotic heart disease of native coronary artery without angina pectoris: Secondary | ICD-10-CM | POA: Diagnosis not present

## 2014-06-20 DIAGNOSIS — M1990 Unspecified osteoarthritis, unspecified site: Secondary | ICD-10-CM | POA: Diagnosis not present

## 2014-07-16 ENCOUNTER — Other Ambulatory Visit: Payer: Self-pay | Admitting: Cardiology

## 2014-07-19 ENCOUNTER — Encounter: Payer: Self-pay | Admitting: Cardiology

## 2014-07-19 ENCOUNTER — Ambulatory Visit (INDEPENDENT_AMBULATORY_CARE_PROVIDER_SITE_OTHER): Payer: Medicare Other | Admitting: Cardiology

## 2014-07-19 VITALS — BP 138/64 | HR 60 | Ht 66.0 in | Wt 163.6 lb

## 2014-07-19 DIAGNOSIS — I10 Essential (primary) hypertension: Secondary | ICD-10-CM

## 2014-07-19 DIAGNOSIS — I2510 Atherosclerotic heart disease of native coronary artery without angina pectoris: Secondary | ICD-10-CM | POA: Diagnosis not present

## 2014-07-19 DIAGNOSIS — Z9861 Coronary angioplasty status: Secondary | ICD-10-CM

## 2014-07-19 DIAGNOSIS — I480 Paroxysmal atrial fibrillation: Secondary | ICD-10-CM | POA: Diagnosis not present

## 2014-07-19 DIAGNOSIS — Z7901 Long term (current) use of anticoagulants: Secondary | ICD-10-CM | POA: Diagnosis not present

## 2014-07-19 NOTE — Patient Instructions (Signed)
Continue your current therapy  Call me if you continue to have problems with your heart rhythm.

## 2014-07-19 NOTE — Progress Notes (Signed)
Dominique Perez Date of Birth: Mar 03, 1927 Medical Record #[id number]  History of Present Illness: Dominique Perez is seen for followup Afib and CAD. She has a history of atrial fibrillation that is managed with Multaq and Xarelto.She has a history of significant coronary disease. She is status post stenting of the right coronary in 1999. She had stenting of the diagonal 2007 with a drug-eluting stent.  She was seen in November with recurrent episodes of Afib with RVR. She was seen by Dr. Rayann Heman who recommended continued Multaq and prn metoprolol. She did very well until 2 days ago when she had recurrent tachycardia that lasted a couple of hours. It happened again the following morning. Episodes resolved spontaneously. She attributes this to a new brand of coffee although it is decaf. She was also seen in the ED in December with dsyesthesias of her left leg. Cranial CT was negative. She was later Dx with lumbar radiculopathy and treated with gabapentin with relief. She denies any chest pain or SOB.   Current Outpatient Prescriptions on File Prior to Visit  Medication Sig Dispense Refill  . dronedarone (MULTAQ) 400 MG tablet TAKE 1 BY MOUTH TWICE DAILY WITH A MEAL 180 tablet 1  . gabapentin (NEURONTIN) 100 MG capsule Take 1 capsule (100 mg total) by mouth 3 (three) times daily. 100 capsule 5  . ibuprofen (ADVIL,MOTRIN) 400 MG tablet Take 1 tablet (400 mg total) by mouth 3 (three) times daily. 12 tablet 0  . isosorbide mononitrate (IMDUR) 60 MG 24 hr tablet Take 1 tablet (60 mg total) by mouth daily. 90 tablet 1  . lansoprazole (PREVACID) 30 MG capsule Take 1 capsule (30 mg total) by mouth daily. 90 capsule 1  . metoprolol tartrate (LOPRESSOR) 25 MG tablet TAKE 1 BY MOUTH 3 TIMES DAILY (GENERIC FOR LOPRESSOR) 270 tablet 0  . Multiple Vitamin (MULTIVITAMIN) tablet Take 1 tablet by mouth daily.      . simvastatin (ZOCOR) 20 MG tablet Take 0.5 tablets (10 mg total) by mouth at bedtime. Taking 1/2 daily 45  tablet 3  . traMADol-acetaminophen (ULTRACET) 37.5-325 MG per tablet TAKE 1 TABLET EVERY 6 HOURS AS NEEDED FOR PAIN 120 tablet 5  . XARELTO 15 MG TABS tablet TAKE 1 BY MOUTH DAILY 90 tablet 0   No current facility-administered medications on file prior to visit.    Allergies  Allergen Reactions  . Amiodarone     tremor  . Erythromycin Stearate     REACTION: fever  . Ferrous Sulfate     REACTION: fever  . Iohexol      Code: HIVES, Desc: Pt states that 10 yrs ago she IV contrast for a CT and broke out in hives and experienced SOB.  Done w/o iv contrast today., Onset Date: [id number]   . Sulfa Antibiotics Hives    Past Medical History  Diagnosis Date  . Coronary artery disease     s/p pci 1999 rca, 2007 diagonal  . Hypertension   . Tremor     chronic  . Leg pain, right     chronic  . Arthritis   . GERD (gastroesophageal reflux disease)   . Hyperlipidemia   . Gallstones   . Atrial fibrillation   . Fracture of right femur   . Atrial flutter     Past Surgical History  Procedure Laterality Date  . Knee surgery      right, titanium rod in the right femur  . Femoral pseudoaneurysm  1999    repair  .  Cardiac catheterization  1999     PCI to RCA  . Cardiac catheterization  2007    PCI to diagonal  . Cardiac catheterization  2002    treated medically  . Total hip arthroplasty      Dr. Durward Fortes  . Cataract extraction    . Left knee  2009    replacement by Dr. Wynelle Link    History  Smoking status  . Never Smoker   Smokeless tobacco  . Not on file    History  Alcohol Use No    Family History  Problem Relation Age of Onset  . Heart attack Mother   . Cancer Father     Review of Systems: As noted in HPI. All other systems were reviewed and are negative.  Physical Exam: BP 138/64 mmHg  Pulse 60  Ht 5\' 6"  (1.676 m)  Wt 163 lb 9.6 oz (74.208 kg)  BMI 26.42 kg/m2 She is an elderly white female in no acute distress. She is seen in a wheelchair.Marland Kitchen HEENT is  normal.  Neck is without JVD or bruits. Lungs are clear. CV exam shows a regular rate and rhythm without gallop or murmur. Abdomen is soft and nontender without masses or edema. She has no focal neurologic abnormalities.  LABORATORY DATA: Ecg reviewed from December 2015. NSR Qtc 481 msec.   Assessment / Plan: 1. Coronary disease status post stenting of the right coronary in 1999. Status post stenting of the diagonal and 2007. She is asymptomatic. Continue isosorbide, metoprolol, and statin therapy.   2. Atrial fibrillation-now with intermittent breakthrough on Multaq. Will continue therapy for now with prn metoprolol for breakthrough.  Continue chronic anticoagulation. If she has more breakthrough options are limited. She is intolerant of amiodarone. Cannot take Flecainide due to CAD. May be a candidate for Tikosyn. Will observe for now.   3. Hypertension, well controlled.  4. Hypercholesterolemia-on simvastatin.  Follow up in 6 months.

## 2014-09-11 ENCOUNTER — Other Ambulatory Visit: Payer: Self-pay

## 2014-09-11 MED ORDER — ISOSORBIDE MONONITRATE ER 60 MG PO TB24: 60.0000 mg | ORAL_TABLET | Freq: Every day | ORAL | Status: DC

## 2014-09-11 NOTE — Telephone Encounter (Signed)
Rx(s) sent to pharmacy electronically.  

## 2014-10-23 ENCOUNTER — Other Ambulatory Visit: Payer: Self-pay | Admitting: Cardiology

## 2014-12-03 ENCOUNTER — Other Ambulatory Visit: Payer: Self-pay | Admitting: Cardiology

## 2014-12-04 ENCOUNTER — Other Ambulatory Visit: Payer: Self-pay

## 2014-12-04 MED ORDER — DRONEDARONE HCL 400 MG PO TABS: ORAL_TABLET | ORAL | Status: DC

## 2014-12-18 ENCOUNTER — Other Ambulatory Visit: Payer: Self-pay | Admitting: Family Medicine

## 2014-12-18 NOTE — Telephone Encounter (Signed)
Last visit 05/02/14 Last refill 06/18/14 #120 5 refill

## 2014-12-18 NOTE — Telephone Encounter (Signed)
Refill with one additional refill and then needs office follow up.

## 2015-01-21 ENCOUNTER — Other Ambulatory Visit: Payer: Self-pay | Admitting: *Deleted

## 2015-01-21 MED ORDER — RIVAROXABAN 15 MG PO TABS: 15.0000 mg | ORAL_TABLET | Freq: Every day | ORAL | Status: DC

## 2015-01-22 ENCOUNTER — Encounter (HOSPITAL_COMMUNITY): Payer: Self-pay | Admitting: Emergency Medicine

## 2015-01-22 ENCOUNTER — Emergency Department (HOSPITAL_COMMUNITY): Payer: Medicare Other

## 2015-01-22 ENCOUNTER — Emergency Department (HOSPITAL_COMMUNITY)
Admission: EM | Admit: 2015-01-22 | Discharge: 2015-01-22 | Disposition: A | Discharge status: Home / Self Care | Payer: Medicare Other | Attending: Emergency Medicine | Admitting: Emergency Medicine

## 2015-01-22 DIAGNOSIS — K219 Gastro-esophageal reflux disease without esophagitis: Secondary | ICD-10-CM | POA: Insufficient documentation

## 2015-01-22 DIAGNOSIS — Z79899 Other long term (current) drug therapy: Secondary | ICD-10-CM | POA: Diagnosis not present

## 2015-01-22 DIAGNOSIS — Z87828 Personal history of other (healed) physical injury and trauma: Secondary | ICD-10-CM | POA: Insufficient documentation

## 2015-01-22 DIAGNOSIS — G8929 Other chronic pain: Secondary | ICD-10-CM | POA: Diagnosis not present

## 2015-01-22 DIAGNOSIS — M1990 Unspecified osteoarthritis, unspecified site: Secondary | ICD-10-CM | POA: Insufficient documentation

## 2015-01-22 DIAGNOSIS — I4891 Unspecified atrial fibrillation: Secondary | ICD-10-CM | POA: Diagnosis not present

## 2015-01-22 DIAGNOSIS — I2510 Atherosclerotic heart disease of native coronary artery without angina pectoris: Secondary | ICD-10-CM | POA: Diagnosis not present

## 2015-01-22 DIAGNOSIS — R002 Palpitations: Secondary | ICD-10-CM | POA: Diagnosis present

## 2015-01-22 DIAGNOSIS — E785 Hyperlipidemia, unspecified: Secondary | ICD-10-CM | POA: Diagnosis not present

## 2015-01-22 DIAGNOSIS — I480 Paroxysmal atrial fibrillation: Secondary | ICD-10-CM | POA: Insufficient documentation

## 2015-01-22 DIAGNOSIS — I10 Essential (primary) hypertension: Secondary | ICD-10-CM | POA: Insufficient documentation

## 2015-01-22 LAB — BASIC METABOLIC PANEL
ANION GAP: 7 (ref 5–15)
BUN: 14 mg/dL (ref 6–20)
CALCIUM: 8.6 mg/dL — AB (ref 8.9–10.3)
CO2: 25 mmol/L (ref 22–32)
Chloride: 106 mmol/L (ref 101–111)
Creatinine, Ser: 1.29 mg/dL — ABNORMAL HIGH (ref 0.44–1.00)
GFR calc non Af Amer: 37 mL/min — ABNORMAL LOW (ref >60)
GFR, EST AFRICAN AMERICAN: 43 mL/min — AB (ref >60)
Glucose, Bld: 117 mg/dL — ABNORMAL HIGH (ref 65–99)
Potassium: 4.1 mmol/L (ref 3.5–5.1)
SODIUM: 138 mmol/L (ref 135–145)

## 2015-01-22 LAB — CBC
HCT: 39.3 % (ref 36.0–46.0)
HEMOGLOBIN: 12.7 g/dL (ref 12.0–15.0)
MCH: 28 pg (ref 26.0–34.0)
MCHC: 32.3 g/dL (ref 30.0–36.0)
MCV: 86.8 fL (ref 78.0–100.0)
PLATELETS: 183 10*3/uL (ref 150–400)
RBC: 4.53 MIL/uL (ref 3.87–5.11)
RDW: 15.2 % (ref 11.5–15.5)
WBC: 7.8 10*3/uL (ref 4.0–10.5)

## 2015-01-22 LAB — I-STAT TROPONIN, ED: TROPONIN I, POC: 0.01 ng/mL (ref 0.00–0.08)

## 2015-01-22 LAB — TSH: TSH: 0.935 u[IU]/mL (ref 0.350–4.500)

## 2015-01-22 LAB — PROTIME-INR
INR: 1.88 — AB (ref 0.00–1.49)
PROTHROMBIN TIME: 21.5 s — AB (ref 11.6–15.2)

## 2015-01-22 NOTE — ED Notes (Signed)
Pt. arrived with EMS from home reports intermittent  palpitations " skipping" onset this evening . Denies chest pain / no SOB , history of Afib.

## 2015-01-22 NOTE — Discharge Instructions (Signed)

## 2015-01-23 NOTE — ED Provider Notes (Signed)
CSN: [id number]     Arrival date & time 01/22/15  1922 History   First MD Initiated Contact with Patient 01/22/15 1940     Chief Complaint  Patient presents with  . Palpitations     (Consider location/radiation/quality/duration/timing/severity/associated sxs/prior Treatment) Patient is a 81 y.o. female presenting with palpitations. The history is provided by the patient.  Palpitations Palpitations quality:  Fast Onset quality:  Sudden Duration:  1 day Timing:  Intermittent Progression:  Resolved Chronicity:  Recurrent Relieved by:  Nothing Worsened by:  Nothing Ineffective treatments:  None tried Associated symptoms: no chest pain, no dizziness, no nausea, no shortness of breath and no vomiting    81 yo F with a chief complaint palpitations. He has had these off and on for some time. Patient has a history of paroxysmal atrial fibrillation. Patient takes multaq for this.  Patient states that is somewhat resolved.  Past Medical History  Diagnosis Date  . Coronary artery disease     s/p pci 1999 rca, 2007 diagonal  . Hypertension   . Tremor     chronic  . Leg pain, right     chronic  . Arthritis   . GERD (gastroesophageal reflux disease)   . Hyperlipidemia   . Gallstones   . Atrial fibrillation   . Fracture of right femur   . Atrial flutter    Past Surgical History  Procedure Laterality Date  . Knee surgery      right, titanium rod in the right femur  . Femoral pseudoaneurysm  1999    repair  . Cardiac catheterization  1999     PCI to RCA  . Cardiac catheterization  2007    PCI to diagonal  . Cardiac catheterization  2002    treated medically  . Total hip arthroplasty      Dr. Durward Fortes  . Cataract extraction    . Left knee  2009    replacement by Dr. Wynelle Link   Family History  Problem Relation Age of Onset  . Heart attack Mother   . Cancer Father    Social History  Substance Use Topics  . Smoking status: Never Smoker   . Smokeless tobacco: None  .  Alcohol Use: No   OB History    No data available     Review of Systems  Constitutional: Negative for fever and chills.  HENT: Negative for congestion and rhinorrhea.   Eyes: Negative for redness and visual disturbance.  Respiratory: Negative for shortness of breath and wheezing.   Cardiovascular: Positive for palpitations. Negative for chest pain.  Gastrointestinal: Negative for nausea and vomiting.  Genitourinary: Negative for dysuria and urgency.  Musculoskeletal: Negative for myalgias and arthralgias.  Skin: Negative for pallor and wound.  Neurological: Negative for dizziness and headaches.      Allergies  Amiodarone; Erythromycin stearate; Ferrous sulfate; Iohexol; and Sulfa antibiotics  Home Medications   Prior to Admission medications   Medication Sig Start Date End Date Taking? Authorizing Provider  dronedarone (MULTAQ) 400 MG tablet TAKE 1 BY MOUTH TWICE DAILY WITH A MEAL 12/04/14  Yes Peter M Martinique, MD  gabapentin (NEURONTIN) 100 MG capsule Take 1 capsule (100 mg total) by mouth 3 (three) times daily. 05/02/14  Yes Eulas Post, MD  isosorbide mononitrate (IMDUR) 60 MG 24 hr tablet Take 1 tablet (60 mg total) by mouth daily. 09/11/14  Yes Peter M Martinique, MD  lansoprazole (PREVACID) 30 MG capsule TAKE 1 BY MOUTH DAILY 10/23/14  Yes Collier Salina  M Martinique, MD  metoprolol tartrate (LOPRESSOR) 25 MG tablet TAKE 1 BY MOUTH 3 TIMES DAILY  ([place]) 10/23/14  Yes Peter M Martinique, MD  Multiple Vitamin (MULTIVITAMIN) tablet Take 1 tablet by mouth daily.     Yes Historical Provider, MD  Rivaroxaban (XARELTO) 15 MG TABS tablet Take 1 tablet (15 mg total) by mouth daily with supper. Patient taking differently: Take 15 mg by mouth daily after breakfast.  01/21/15  Yes Peter M Martinique, MD  simvastatin (ZOCOR) 20 MG tablet Take 0.5 tablets (10 mg total) by mouth at bedtime. Taking 1/2 daily 03/21/14  Yes Peter M Martinique, MD  traMADol-acetaminophen (ULTRACET) 37.5-325 MG per tablet  TAKE 1 TABLET EVERY 6 HOURS AS NEEDED FOR PAIN Patient taking differently: TAKE 1 TABLET BY MOUTH TWICE DAILY 12/19/14  Yes Eulas Post, MD  ibuprofen (ADVIL,MOTRIN) 400 MG tablet Take 1 tablet (400 mg total) by mouth 3 (three) times daily. Patient not taking: Reported on 01/22/2015 04/21/14   Carmin Muskrat, MD   BP 140/68 mmHg  Pulse 70  Temp(Src) 98.5 F (36.9 C) (Oral)  Resp 16  SpO2 96% Physical Exam  Constitutional: She is oriented to person, place, and time. She appears well-developed and well-nourished. No distress.  HENT:  Head: Normocephalic and atraumatic.  Eyes: EOM are normal. Pupils are equal, round, and reactive to light.  Neck: Normal range of motion. Neck supple.  Cardiovascular: An irregularly irregular rhythm present. Tachycardia present.  Exam reveals no gallop and no friction rub.   No murmur heard. Pulmonary/Chest: Effort normal. She has no wheezes. She has no rales.  Abdominal: Soft. She exhibits no distension. There is no tenderness.  Musculoskeletal: She exhibits no edema or tenderness.  Neurological: She is alert and oriented to person, place, and time.  Skin: Skin is warm and dry. She is not diaphoretic.  Psychiatric: She has a normal mood and affect. Her behavior is normal.    ED Course  Procedures (including critical care time) Labs Review Labs Reviewed  BASIC METABOLIC PANEL - Abnormal; Notable for the following:    Glucose, Bld 117 (*)    Creatinine, Ser 1.29 (*)    Calcium 8.6 (*)    GFR calc non Af Amer 37 (*)    GFR calc Af Amer 43 (*)    All other components within normal limits  PROTIME-INR - Abnormal; Notable for the following:    Prothrombin Time 21.5 (*)    INR 1.88 (*)    All other components within normal limits  CBC  TSH  I-STAT TROPOININ, ED    Imaging Review Dg Chest 2 View  01/22/2015   CLINICAL DATA:  81 year old female with palpitations.  EXAM: CHEST  2 VIEW  COMPARISON:  Radiograph dated 01/28/2011  FINDINGS: Two  views of the chest demonstrate emphysematous changes of the lungs. A focal area of minimal increased interstitial density in the lingula appears similar or improved compared to the prior study and most likely represent atelectasis/scarring. Pneumonia is less likely. Clinical correlation is recommended. There is no focal consolidation, pleural effusion, or pneumothorax. The cardiac silhouette is within normal limits. Osteopenia. No acute fracture.  IMPRESSION: No active cardiopulmonary disease.   Electronically Signed   By: Anner Crete M.D.   On: 01/22/2015 19:55   I have personally reviewed and evaluated these images and lab results as part of my medical decision-making.   EKG Interpretation   Date/Time:  Tuesday January 22 2015 19:26:39 EDT Ventricular Rate:  107 PR Interval:    QRS Duration: 78 QT Interval:  352 QTC Calculation: 469 R Axis:   -9 Text Interpretation:  Atrial fibrillation with rapid ventricular response  Minimal voltage criteria for LVH, may be normal variant Abnormal ECG No  significant change since last tracing Confirmed by Gaylin Bulthuis MD, Quillian Quince  ([id number]) on 01/22/2015 7:38:56 PM      MDM   Final diagnoses:  Paroxysmal atrial fibrillation    81 yo F with a chief complaint of palpitations. Patient with mild AKI on lab results. Patient otherwise asymptomatic. Requesting discharge home. We'll have follow with PCP for recheck.   I have discussed the diagnosis/risks/treatment options with the patient and family and believe the pt to be eligible for discharge home to follow-up with PCP. We also discussed returning to the ED immediately if new or worsening sx occur. We discussed the sx which are most concerning (e.g., sudden recurrent symptoms) that necessitate immediate return. Medications administered to the patient during their visit and any new prescriptions provided to the patient are listed below.  Medications given during this visit Medications - No data to  display  Discharge Medication List as of 01/22/2015 10:14 PM       The patient appears reasonably screen and/or stabilized for discharge and I doubt any other medical condition or other [facility] requiring further screening, evaluation, or treatment in the ED at this time prior to discharge.      Deno Etienne, DO 01/23/15 0008

## 2015-01-24 ENCOUNTER — Encounter: Payer: Self-pay | Admitting: Cardiology

## 2015-01-24 ENCOUNTER — Ambulatory Visit (INDEPENDENT_AMBULATORY_CARE_PROVIDER_SITE_OTHER): Payer: Medicare Other | Admitting: Cardiology

## 2015-01-24 VITALS — BP 120/64 | HR 62 | Ht 67.0 in | Wt 160.5 lb

## 2015-01-24 DIAGNOSIS — Z9861 Coronary angioplasty status: Secondary | ICD-10-CM | POA: Diagnosis not present

## 2015-01-24 DIAGNOSIS — I2510 Atherosclerotic heart disease of native coronary artery without angina pectoris: Secondary | ICD-10-CM | POA: Diagnosis not present

## 2015-01-24 DIAGNOSIS — Z7901 Long term (current) use of anticoagulants: Secondary | ICD-10-CM | POA: Diagnosis not present

## 2015-01-24 DIAGNOSIS — N183 Chronic kidney disease, stage 3 unspecified: Secondary | ICD-10-CM

## 2015-01-24 DIAGNOSIS — I480 Paroxysmal atrial fibrillation: Secondary | ICD-10-CM

## 2015-01-24 NOTE — Patient Instructions (Signed)
Continue your current therapy  If your heart races you can take an extra metoprolol.  I will see you in 6 months

## 2015-01-24 NOTE — Progress Notes (Signed)
Dominique Perez Date of Birth: 05/28/1926 Medical Record #[id number]  History of Present Illness: Dominique Perez is seen for followup Afib and CAD. She has a history of atrial fibrillation that is managed with Multaq and Xarelto.She has a history of significant coronary disease. She is status post stenting of the right coronary in 1999. She had stenting of the diagonal 2007 with a drug-eluting stent.   She has done well until 2 days ago when she had recurrent tachycardia that lasted a couple of hours. She went to the ED and was in Afib with rate 107.  Episode resolved spontaneously. No clear triggers. She denies any chest pain or SOB. She has about 2-3 episodes of afib a year.   Current Outpatient Prescriptions on File Prior to Visit  Medication Sig Dispense Refill  . dronedarone (MULTAQ) 400 MG tablet TAKE 1 BY MOUTH TWICE DAILY WITH A MEAL 180 tablet 3  . gabapentin (NEURONTIN) 100 MG capsule Take 1 capsule (100 mg total) by mouth 3 (three) times daily. 100 capsule 5  . ibuprofen (ADVIL,MOTRIN) 400 MG tablet Take 1 tablet (400 mg total) by mouth 3 (three) times daily. 12 tablet 0  . isosorbide mononitrate (IMDUR) 60 MG 24 hr tablet Take 1 tablet (60 mg total) by mouth daily. 90 tablet 3  . lansoprazole (PREVACID) 30 MG capsule TAKE 1 BY MOUTH DAILY 90 capsule 2  . metoprolol tartrate (LOPRESSOR) 25 MG tablet TAKE 1 BY MOUTH 3 TIMES DAILY  (GENERIC FOR LOPRESSOR) 270 tablet 1  . Multiple Vitamin (MULTIVITAMIN) tablet Take 1 tablet by mouth daily.      . Rivaroxaban (XARELTO) 15 MG TABS tablet Take 1 tablet (15 mg total) by mouth daily with supper. (Patient taking differently: Take 15 mg by mouth daily after breakfast. ) 30 tablet 0  . simvastatin (ZOCOR) 20 MG tablet Take 0.5 tablets (10 mg total) by mouth at bedtime. Taking 1/2 daily 45 tablet 3  . traMADol-acetaminophen (ULTRACET) 37.5-325 MG per tablet TAKE 1 TABLET EVERY 6 HOURS AS NEEDED FOR PAIN (Patient taking differently: TAKE 1 TABLET BY  MOUTH TWICE DAILY) 120 tablet 1   No current facility-administered medications on file prior to visit.    Allergies  Allergen Reactions  . Amiodarone     tremor  . Erythromycin Stearate     REACTION: fever  . Ferrous Sulfate     REACTION: fever  . Iohexol      Code: HIVES, Desc: Pt states that 10 yrs ago she IV contrast for a CT and broke out in hives and experienced SOB.  Done w/o iv contrast today., Onset Date: [id number]   . Sulfa Antibiotics Hives    Past Medical History  Diagnosis Date  . Coronary artery disease     s/p pci 1999 rca, 2007 diagonal  . Hypertension   . Tremor     chronic  . Leg pain, right     chronic  . Arthritis   . GERD (gastroesophageal reflux disease)   . Hyperlipidemia   . Gallstones   . Atrial fibrillation   . Fracture of right femur   . Atrial flutter     Past Surgical History  Procedure Laterality Date  . Knee surgery      right, titanium rod in the right femur  . Femoral pseudoaneurysm  1999    repair  . Cardiac catheterization  1999     PCI to RCA  . Cardiac catheterization  2007    PCI to  diagonal  . Cardiac catheterization  2002    treated medically  . Total hip arthroplasty      Dr. Durward Fortes  . Cataract extraction    . Left knee  2009    replacement by Dr. Wynelle Link    History  Smoking status  . Never Smoker   Smokeless tobacco  . Not on file    History  Alcohol Use No    Family History  Problem Relation Age of Onset  . Heart attack Mother   . Cancer Father     Review of Systems: As noted in HPI. All other systems were reviewed and are negative.  Physical Exam: BP 120/64 mmHg  Pulse 62  Ht 5\' 7"  (1.702 m)  Wt 72.802 kg (160 lb 8 oz)  BMI 25.13 kg/m2 She is an elderly white female in no acute distress. She is seen in a wheelchair.Marland Kitchen HEENT is normal.  Neck is without JVD or bruits. Lungs are clear. CV exam shows a regular rate and rhythm without gallop or murmur. Abdomen is soft and nontender without masses  or edema. She has no focal neurologic abnormalities.  LABORATORY DATA: Ecg reviewed from 01/22/15. afib rate 107. Normal Qtc. I have personally reviewed and interpreted this study.  Lab Results  Component Value Date   WBC 7.8 01/22/2015   HGB 12.7 01/22/2015   HCT 39.3 01/22/2015   PLT 183 01/22/2015   GLUCOSE 117* 01/22/2015   CHOL 146 11/15/2013   TRIG 80.0 11/15/2013   HDL 71.30 11/15/2013   LDLCALC 59 11/15/2013   ALT 8 04/21/2014   AST 17 04/21/2014   NA 138 01/22/2015   K 4.1 01/22/2015   CL 106 01/22/2015   CREATININE 1.29* 01/22/2015   BUN 14 01/22/2015   CO2 25 01/22/2015   TSH 0.935 01/22/2015   INR 1.88* 01/22/2015     Assessment / Plan: 1. Coronary disease status post stenting of the right coronary in 1999. Status post stenting of the diagonal and 2007. She is asymptomatic. Continue isosorbide, metoprolol, and statin therapy.   2. Atrial fibrillation-now with intermittent breakthrough on Multaq. Will continue therapy for now with prn additional  metoprolol for breakthrough.  Continue chronic anticoagulation. If she has more frequent breakthrough options are limited. She is intolerant of amiodarone. Cannot take Flecainide due to CAD. May be a candidate for Tikosyn. Will observe for now.   3. Hypertension, well controlled.  4. Hypercholesterolemia-on simvastatin.  Follow up in 6 months.

## 2015-01-28 ENCOUNTER — Other Ambulatory Visit: Payer: Self-pay | Admitting: Cardiology

## 2015-01-28 NOTE — Telephone Encounter (Signed)
REFILL 

## 2015-02-08 ENCOUNTER — Telehealth: Payer: Self-pay | Admitting: Cardiology

## 2015-02-08 NOTE — Telephone Encounter (Signed)
New message      Pt is on xarelto.  She heard it was dangerous.  She has questions.  Please call.

## 2015-02-08 NOTE — Telephone Encounter (Signed)
Patient was listening to a TV program today and they were talking about how dangerous Xarelto is.  She is wondering if she has anything to worry about and adds that she has been on it along time  I told her any medication can be dangerous for some people  I reassured her and said I would send to it to [facility] and Dr. Martinique to see if they have anything to add for you specifically

## 2015-02-17 ENCOUNTER — Other Ambulatory Visit: Payer: Self-pay | Admitting: Cardiology

## 2015-02-18 ENCOUNTER — Other Ambulatory Visit: Payer: Self-pay

## 2015-02-18 ENCOUNTER — Telehealth: Payer: Self-pay | Admitting: Cardiology

## 2015-02-18 MED ORDER — RIVAROXABAN 15 MG PO TABS: 15.0000 mg | ORAL_TABLET | Freq: Every day | ORAL | Status: DC

## 2015-02-18 NOTE — Telephone Encounter (Signed)
Agree that okay to take this afternoon then resume in the AM.

## 2015-02-18 NOTE — Telephone Encounter (Signed)
New message       Pt c/o medication issue:  1. Name of Medication: xarelto 2. How are you currently taking this medication (dosage and times per day)? 15mg  daily 3. Are you having a reaction (difficulty breathing--STAT)? no 4. What is your medication issue?  Pt ran out of medication and did not take a pill yesterday.  She says she can get to the drug store today.  Will missing a pill change anything?

## 2015-02-18 NOTE — Telephone Encounter (Signed)
Pt advised and voiced understanding of instructions.

## 2015-02-18 NOTE — Telephone Encounter (Signed)
Pt missed dose of Xarelto yesterday, usually takes in AM - has family member picking up Rx this afternoon and plans to take today's dose in afternoon and resume in AM tomorrow. Advised this should be fine - will send to pharmacist for best advice and return call w/ recommendation (if different). Pt voiced understanding.

## 2015-02-18 NOTE — Telephone Encounter (Signed)
Patient states she needs to speak with Ovid Curd

## 2015-03-25 ENCOUNTER — Other Ambulatory Visit: Payer: Self-pay | Admitting: Cardiology

## 2015-03-31 DIAGNOSIS — Z23 Encounter for immunization: Secondary | ICD-10-CM | POA: Diagnosis not present

## 2015-04-21 ENCOUNTER — Other Ambulatory Visit: Payer: Self-pay | Admitting: Family Medicine

## 2015-04-23 NOTE — Telephone Encounter (Signed)
I have not seen her since 12/15.  Refill once.  Needs to set up f/u appt.

## 2015-04-23 NOTE — Telephone Encounter (Signed)
Last refill was 12-19-2014 #120, 1 No pending appt  Last seen on 01/22/2015 Okay to refill?

## 2015-04-26 ENCOUNTER — Encounter: Payer: Self-pay | Admitting: Family Medicine

## 2015-04-26 ENCOUNTER — Ambulatory Visit (INDEPENDENT_AMBULATORY_CARE_PROVIDER_SITE_OTHER): Payer: Medicare Other | Admitting: Family Medicine

## 2015-04-26 VITALS — BP 140/82 | HR 76 | Temp 97.7°F | Resp 16 | Ht 67.0 in | Wt 164.3 lb

## 2015-04-26 DIAGNOSIS — N183 Chronic kidney disease, stage 3 unspecified: Secondary | ICD-10-CM

## 2015-04-26 DIAGNOSIS — Z9861 Coronary angioplasty status: Secondary | ICD-10-CM | POA: Diagnosis not present

## 2015-04-26 DIAGNOSIS — Z79899 Other long term (current) drug therapy: Secondary | ICD-10-CM | POA: Diagnosis not present

## 2015-04-26 DIAGNOSIS — E785 Hyperlipidemia, unspecified: Secondary | ICD-10-CM

## 2015-04-26 DIAGNOSIS — I480 Paroxysmal atrial fibrillation: Secondary | ICD-10-CM

## 2015-04-26 DIAGNOSIS — I10 Essential (primary) hypertension: Secondary | ICD-10-CM

## 2015-04-26 DIAGNOSIS — I2510 Atherosclerotic heart disease of native coronary artery without angina pectoris: Secondary | ICD-10-CM

## 2015-04-26 LAB — BASIC METABOLIC PANEL
BUN: 13 mg/dL (ref 6–23)
CO2: 25 mEq/L (ref 19–32)
CREATININE: 1.17 mg/dL (ref 0.40–1.20)
Calcium: 9 mg/dL (ref 8.4–10.5)
Chloride: 105 mEq/L (ref 96–112)
GFR: 46.61 mL/min — AB (ref >60.00)
GLUCOSE: 92 mg/dL (ref 70–99)
POTASSIUM: 4.7 meq/L (ref 3.5–5.1)
Sodium: 140 mEq/L (ref 135–145)

## 2015-04-26 LAB — LIPID PANEL
CHOLESTEROL: 161 mg/dL (ref 0–200)
HDL: 72.8 mg/dL (ref >39.00)
LDL Cholesterol: 63 mg/dL (ref 0–99)
NONHDL: 87.8
Total CHOL/HDL Ratio: 2
Triglycerides: 125 mg/dL (ref 0.0–149.0)
VLDL: 25 mg/dL (ref 0.0–40.0)

## 2015-04-26 LAB — HEPATIC FUNCTION PANEL
ALT: 10 U/L (ref 0–35)
AST: 18 U/L (ref 0–37)
Albumin: 3.8 g/dL (ref 3.5–5.2)
Alkaline Phosphatase: 82 U/L (ref 39–117)
BILIRUBIN DIRECT: 0.1 mg/dL (ref 0.0–0.3)
BILIRUBIN TOTAL: 0.3 mg/dL (ref 0.2–1.2)
Total Protein: 6.9 g/dL (ref 6.0–8.3)

## 2015-04-26 LAB — MAGNESIUM: MAGNESIUM: 2.1 mg/dL (ref 1.5–2.5)

## 2015-04-26 NOTE — Progress Notes (Signed)
Pre visit review using our clinic review tool, if applicable. No additional management support is needed unless otherwise documented below in the visit note. 

## 2015-04-26 NOTE — Progress Notes (Signed)
Subjective:    Patient ID: Dominique Perez, female    DOB: 11/25/1926, 81 y.o.   MRN: [id number]  HPI Patient seen for routine medical follow-up  She lives alone but has support from her daughter who lives nearby. Chronic problems include history of obesity, atrial fibrillation, hypertension, hyperlipidemia, GERD, chronic kidney disease,, CAD. She ambulates with a walker. Denies any recent falls. Generally fairly inactive.  Medications reviewed. Patient was unsure couple of medications. Has been on Multaq- per cardiology. Recent EKG in September.No recent magnesium level. Denies chest pains. Denies dizziness. No peripheral edema.Had flu vaccine couple weeks ago Hyperlipidemia treated with simvastatin. No recent lipids.  Past Medical History  Diagnosis Date  . Coronary artery disease     s/p pci 1999 rca, 2007 diagonal  . Hypertension   . Tremor     chronic  . Leg pain, right     chronic  . Arthritis   . GERD (gastroesophageal reflux disease)   . Hyperlipidemia   . Gallstones   . Atrial fibrillation ([place])   . Fracture of right femur ([place])   . Atrial flutter [facility])    Past Surgical History  Procedure Laterality Date  . Knee surgery      right, titanium rod in the right femur  . Femoral pseudoaneurysm  1999    repair  . Cardiac catheterization  1999     PCI to RCA  . Cardiac catheterization  2007    PCI to diagonal  . Cardiac catheterization  2002    treated medically  . Total hip arthroplasty      Dr. Durward Fortes  . Cataract extraction    . Left knee  2009    replacement by Dr. Wynelle Link    reports that she has never smoked. She does not have any smokeless tobacco history on file. She reports that she does not drink alcohol. Her drug history is not on file. family history includes Cancer in her father; Heart attack in her mother. Allergies  Allergen Reactions  . Amiodarone     tremor  . Erythromycin Stearate     REACTION: fever  . Ferrous Sulfate     REACTION:  fever  . Iohexol      Code: HIVES, Desc: Pt states that 10 yrs ago she IV contrast for a CT and broke out in hives and experienced SOB.  Done w/o iv contrast today., Onset Date: [id number]   . Sulfa Antibiotics Hives      Review of Systems  Constitutional: Negative for fatigue.  Eyes: Negative for visual disturbance.  Respiratory: Negative for cough, chest tightness, shortness of breath and wheezing.   Cardiovascular: Negative for chest pain, palpitations and leg swelling.  Genitourinary: Negative for dysuria.  Neurological: Negative for dizziness, seizures, syncope, weakness, light-headedness and headaches.  Hematological: Does not bruise/bleed easily.  Psychiatric/Behavioral: Negative for agitation.       Objective:   Physical Exam  Constitutional: She appears well-developed and well-nourished.  Neck: Neck supple. No JVD present.  Cardiovascular: Normal rate and regular rhythm.   Pulmonary/Chest: Effort normal and breath sounds normal. No respiratory distress. She has no wheezes. She has no rales.  Musculoskeletal: She exhibits no edema.  Neurological: She is alert.  Psychiatric: She has a normal mood and affect. Her behavior is normal.          Assessment & Plan:  #1 hypertension. Stable. Continue current medications #2 chronic kidney disease. Recheck basic metabolic panel. Check magnesium level on Multaq. #3  dyslipidemia. Check lipid and hepatic panel. #4 history of paroxysmal atrial fibrillation. Patient on Xarelto.

## 2015-06-11 ENCOUNTER — Telehealth: Payer: Self-pay | Admitting: Family Medicine

## 2015-06-11 NOTE — Telephone Encounter (Signed)
Patient's daughter wants to know if the doctor can call in a prescription for only two Multaq 400mg  tablets for today, because the patient is out, but the pharmacy is over nighting the prescription, but she will not receive it until tomorrow. Please call in [facility] to [facility] [address].

## 2015-06-12 ENCOUNTER — Other Ambulatory Visit: Payer: Self-pay | Admitting: Family Medicine

## 2015-06-12 ENCOUNTER — Telehealth: Payer: Self-pay | Admitting: Family Medicine

## 2015-06-12 MED ORDER — DRONEDARONE HCL 400 MG PO TABS: ORAL_TABLET | ORAL | Status: DC

## 2015-06-12 NOTE — Telephone Encounter (Signed)
Left a detailed message to daughter stating that medication has been [facility].

## 2015-06-12 NOTE — Telephone Encounter (Signed)
30 day supply sent to pharmacy.  

## 2015-06-12 NOTE — Telephone Encounter (Signed)
Pt needs #30 day supply of multaq 400 mg. Pt is waiting on mailorder

## 2015-06-22 ENCOUNTER — Other Ambulatory Visit: Payer: Self-pay | Admitting: Family Medicine

## 2015-07-04 ENCOUNTER — Telehealth: Payer: Self-pay | Admitting: Cardiology

## 2015-07-04 NOTE — Telephone Encounter (Signed)
(  Call from 06/12/15)----Has question regarding Multaq.

## 2015-07-05 NOTE — Telephone Encounter (Signed)
Returned call to patient's daughter Threasa Beards no answer.[place].

## 2015-07-05 NOTE — Telephone Encounter (Signed)
Returned call to patient's daughter Threasa Beards no answer.[place].

## 2015-07-10 NOTE — Telephone Encounter (Signed)
Patient's daughter never returned call.

## 2015-08-08 ENCOUNTER — Other Ambulatory Visit: Payer: Self-pay | Admitting: *Deleted

## 2015-08-08 MED ORDER — ISOSORBIDE MONONITRATE ER 60 MG PO TB24: 60.0000 mg | ORAL_TABLET | Freq: Every day | ORAL | Status: DC

## 2015-08-09 ENCOUNTER — Other Ambulatory Visit: Payer: Self-pay | Admitting: Family Medicine

## 2015-08-09 ENCOUNTER — Other Ambulatory Visit: Payer: Self-pay | Admitting: *Deleted

## 2015-08-09 MED ORDER — METOPROLOL TARTRATE 25 MG PO TABS: 25.0000 mg | ORAL_TABLET | Freq: Three times a day (TID) | ORAL | Status: DC

## 2015-08-09 MED ORDER — GABAPENTIN 100 MG PO CAPS: 100.0000 mg | ORAL_CAPSULE | Freq: Three times a day (TID) | ORAL | Status: DC

## 2015-08-19 ENCOUNTER — Other Ambulatory Visit: Payer: Self-pay | Admitting: Family Medicine

## 2015-08-19 ENCOUNTER — Other Ambulatory Visit: Payer: Self-pay | Admitting: *Deleted

## 2015-08-19 MED ORDER — SIMVASTATIN 20 MG PO TABS: 10.0000 mg | ORAL_TABLET | Freq: Every day | ORAL | Status: DC

## 2015-08-19 MED ORDER — LANSOPRAZOLE 30 MG PO CPDR: 30.0000 mg | DELAYED_RELEASE_CAPSULE | Freq: Every day | ORAL | Status: DC

## 2015-08-19 NOTE — Telephone Encounter (Signed)
Refill OK

## 2015-08-19 NOTE — Telephone Encounter (Signed)
Last fill was 06/24/2015 #120 Last seen 04/25/2016 Okay to refill?

## 2015-09-09 ENCOUNTER — Other Ambulatory Visit: Payer: Self-pay | Admitting: *Deleted

## 2015-09-09 MED ORDER — RIVAROXABAN 15 MG PO TABS: ORAL_TABLET | ORAL | Status: DC

## 2015-09-10 ENCOUNTER — Other Ambulatory Visit: Payer: Self-pay | Admitting: Pharmacist Clinician (PhC)/ Clinical Pharmacy Specialist

## 2015-09-10 MED ORDER — RIVAROXABAN 15 MG PO TABS: ORAL_TABLET | ORAL | Status: DC

## 2015-09-13 ENCOUNTER — Other Ambulatory Visit: Payer: Self-pay | Admitting: Pharmacist Clinician (PhC)/ Clinical Pharmacy Specialist

## 2015-09-13 MED ORDER — RIVAROXABAN 15 MG PO TABS: ORAL_TABLET | ORAL | Status: DC

## 2015-10-08 ENCOUNTER — Other Ambulatory Visit: Payer: Self-pay | Admitting: *Deleted

## 2015-10-08 NOTE — Telephone Encounter (Signed)
This drug is prescribed per cardiology.

## 2015-10-23 NOTE — Telephone Encounter (Signed)
This was refilled by my CMA in February.  We do not generally refill anti-arrhythmics and she did not realize that this had been prescribed by cardiology.  I would recommend that you check with Dr Martinique regarding refills.  I will be happy to discuss with him if I need to.  Thanks and sorry for any confusion.  I have addressed this with my CMA and she knows not to routinely refill cardiology specific medications such as this in future.

## 2015-10-23 NOTE — Telephone Encounter (Signed)
dronedarone (MULTAQ) 400 MG tablet  Medication   Date: 06/12/2015  Department: [facility]  Ordering/Authorizing: Eulas Post, MD      Order Providers    Prescribing Provider Encounter Provider   Eulas Post, MD Eulas Post, MD    Medication Detail      Disp Refills Start End     dronedarone (MULTAQ) 400 MG tablet 60 tablet 0 06/12/2015     Sig: TAKE 1 BY MOUTH TWICE DAILY WITH A MEAL    E-Prescribing Status: Receipt confirmed by pharmacy (06/12/2015 10:29 AM EST)    You filled  It, we can not, thanks

## 2015-10-23 NOTE — Telephone Encounter (Signed)
Eulas Post, MD   Roberts Gaudy, CMA (You)  1 minute ago   (8:28 AM)    This was refilled by my CMA in February. We do not generally refill anti-arrhythmics and she did not realize that this had been prescribed by cardiology. I would recommend that you check with Dr Martinique regarding refills. I will be happy to discuss with him if I need to. Thanks and sorry for any confusion. I have addressed this with my CMA and she knows not to routinely refill cardiology specific medications such as this in future.    Will Route to [employer]

## 2015-10-24 MED ORDER — DRONEDARONE HCL 400 MG PO TABS
ORAL_TABLET | ORAL | Status: DC
Start: 2015-10-24 — End: 2015-11-18

## 2015-10-26 ENCOUNTER — Other Ambulatory Visit: Payer: Self-pay | Admitting: Family Medicine

## 2015-10-28 NOTE — Telephone Encounter (Signed)
Last OV 04/26/2015 No pending appt Last refill #120 --- 08/20/2015

## 2015-10-28 NOTE — Telephone Encounter (Signed)
Refill once 

## 2015-11-05 ENCOUNTER — Ambulatory Visit: Payer: Medicare Other | Admitting: Nurse Practitioner

## 2015-11-17 NOTE — Progress Notes (Signed)
Cardiology Office Note   Date:  11/18/2015   ID:  Dominique Perez, DOB [birth year], MRN [id number]  PCP:  Dominique Post, MD  Cardiologist:    Dr Dominique Perez  Chief Complaint  Patient presents with  . Atrial Fibrillation      History of Present Illness: Dominique Perez is a 82 y.o. female who presents for PAF.  Today she is maintaining SR rate of 60.  She has not had any rapid beats for some time.  She had been having 2-3 episodes of PAF per year.    She has a hx of Afib and CAD. She has a history of atrial fibrillation that is managed with Multaq and Xarelto.She has a history of significant coronary disease. She is status Perez stenting of the right coronary in 1999. She had stenting of the diagonal 2007 with a drug-eluting stent.   Today no complaints except the chronic fracture in her rt. Leg.  Her daughter and I reviewed meds and reasons for taking.    Past Medical History  Diagnosis Date  . Coronary artery disease     s/p pci 1999 rca, 2007 diagonal  . Hypertension   . Tremor     chronic  . Leg pain, right     chronic  . Arthritis   . GERD (gastroesophageal reflux disease)   . Hyperlipidemia   . Gallstones   . Atrial fibrillation ([place])   . Fracture of right femur ([place])   . Atrial flutter [facility])     Past Surgical History  Procedure Laterality Date  . Knee surgery      right, titanium rod in the right femur  . Femoral pseudoaneurysm  1999    repair  . Cardiac catheterization  1999     PCI to RCA  . Cardiac catheterization  2007    PCI to diagonal  . Cardiac catheterization  2002    treated medically  . Total hip arthroplasty      Dr. Durward Perez  . Cataract extraction    . Left knee  2009    replacement by Dr. Wynelle Perez     Current Outpatient Prescriptions  Medication Sig Dispense Refill  . dronedarone (MULTAQ) 400 MG tablet TAKE 1 BY MOUTH TWICE DAILY WITH A MEAL 60 tablet 0  . gabapentin (NEURONTIN) 100 MG capsule Take 1 capsule (100 mg total) by mouth 3  (three) times daily. 270 capsule 1  . isosorbide mononitrate (IMDUR) 60 MG 24 hr tablet Take 1 tablet (60 mg total) by mouth daily. 90 tablet 1  . lansoprazole (PREVACID) 30 MG capsule Take 1 capsule (30 mg total) by mouth daily at 12 noon. NEED OV. 90 capsule 2  . metoprolol tartrate (LOPRESSOR) 25 MG tablet Take 1 tablet (25 mg total) by mouth 3 (three) times daily. 270 tablet 0  . Multiple Vitamin (MULTIVITAMIN) tablet Take 1 tablet by mouth daily.      . Rivaroxaban (XARELTO) 15 MG TABS tablet Take 1 tablet by mouth every morning with breakfast 90 tablet 1  . simvastatin (ZOCOR) 20 MG tablet Take 0.5 tablets (10 mg total) by mouth daily at 6 PM. NEED OV. 45 tablet 1  . traMADol-acetaminophen (ULTRACET) 37.5-325 MG tablet TAKE 1 TABLET BY MOUTH EVERY 6 HOURS AS NEEDED FOR PAIN 120 tablet 0   No current facility-administered medications for this visit.    Allergies:   Amiodarone; Erythromycin stearate; Ferrous sulfate; Iohexol; and Sulfa antibiotics    Social History:  The patient  reports that she has never smoked. She does not have any smokeless tobacco history on file. She reports that she does not drink alcohol.   Family History:  The patient's family history includes Cancer in her father; Heart attack in her mother.    ROS:  General:no colds or fevers,  weight up 4 pounds Skin:no rashes or ulcers HEENT:no blurred vision, no congestion CV:see HPI PUL:see HPI GI:no diarrhea constipation or melena, no indigestion GU:no hematuria, no dysuria MS:no joint pain, no claudication Neuro:no syncope, no lightheadedness Endo:no diabetes, no thyroid disease  Wt Readings from Last 3 Encounters:  11/18/15 168 lb (76.204 kg)  04/26/15 164 lb 4.8 oz (74.526 kg)  01/24/15 160 lb 8 oz (72.802 kg)     PHYSICAL EXAM: VS:  BP 100/60 mmHg  Pulse 60  Ht 5\' 7"  (1.702 m)  Wt 168 lb (76.204 kg)  BMI 26.31 kg/m2 , BMI Body mass index is 26.31 kg/(m^2). General:Pleasant affect, NAD Skin:Warm and  dry, brisk capillary refill HEENT:normocephalic, sclera clear, mucus membranes moist Neck:supple, no JVD, no bruits  Heart:S1S2 RRR without murmur, gallup, rub or click Lungs:clear without rales, rhonchi, or wheezes [id number], non tender, + BS, do not palpate liver spleen or masses Ext:1+ lower ext edema, 2+ pedal pulses, 2+ radial pulses Neuro:alert and oriented X 3, MAE, follows commands, + facial symmetry    EKG:  EKG is ordered today. The ekg ordered today demonstrates  SR with artifact no acute changes.     Recent Labs: 01/22/2015: Hemoglobin 12.7; Platelets 183; TSH 0.935 04/26/2015: ALT 10; BUN 13; Creatinine, Ser 1.17; Magnesium 2.1; Potassium 4.7; Sodium 140    Lipid Panel    Component Value Date/Time   CHOL 161 04/26/2015 1227   TRIG 125.0 04/26/2015 1227   HDL 72.80 04/26/2015 1227   CHOLHDL 2 04/26/2015 1227   VLDL 25.0 04/26/2015 1227   LDLCALC 63 04/26/2015 1227       Other studies Reviewed: Additional studies/ records that were reviewed today include:  02/2011 Echo. Study Conclusions  - Left ventricle: The cavity size was normal. Wall thickness was increased in a pattern of mild LVH. Systolic function was normal. The estimated ejection fraction was in the range of 55% to 60%. - Mitral valve: Nodular calcification of anterior leaflet tip Mild regurgitation. - Left atrium: The atrium was mildly dilated. - Atrial septum: No defect or patent foramen ovale was identified. - Tricuspid valve: Mild-moderate regurgitation. - Pulmonary arteries: PA peak pressure: 23mm Hg (S). - Pericardium, extracardiac: A trivial pericardial effusion was identified.  ASSESSMENT AND PLAN:  1. Coronary disease status Perez stenting of the right coronary in 1999. Status Perez stenting of the diagonal and 2007. She is asymptomatic. Continue isosorbide, metoprolol, and statin therapy.  Follow up in 6 months with Dr. Martinique.  2. Atrial fibrillation-no further  intermittent breakthrough on Multaq. Will continue therapy for now with prn additional metoprolol for breakthrough. Continue chronic anticoagulation. If she has more frequent breakthrough options are limited. She is intolerant of amiodarone. Cannot take Flecainide due to CAD. May be a candidate for Tikosyn. Will observe for now.   3. Hypertension, well controlled. To borderline low continue to monitor  4. Hypercholesterolemia-on simvastatin.  Current medicines are reviewed with the patient today.  The patient Has no concerns regarding medicines.  The following changes have been made:  See above Labs/ tests ordered today include:see above  Disposition:   FU:  see above  Signed, Cecilie Kicks, NP  11/18/2015 10:08 AM  [place], [place], [place] Phone: [phone]; Fax: [phone]

## 2015-11-18 ENCOUNTER — Ambulatory Visit (INDEPENDENT_AMBULATORY_CARE_PROVIDER_SITE_OTHER): Payer: Medicare Other | Admitting: Cardiology

## 2015-11-18 ENCOUNTER — Encounter: Payer: Self-pay | Admitting: Cardiology

## 2015-11-18 VITALS — BP 100/60 | HR 60 | Ht 67.0 in | Wt 168.0 lb

## 2015-11-18 DIAGNOSIS — Z9861 Coronary angioplasty status: Secondary | ICD-10-CM

## 2015-11-18 DIAGNOSIS — R001 Bradycardia, unspecified: Secondary | ICD-10-CM | POA: Diagnosis not present

## 2015-11-18 DIAGNOSIS — I480 Paroxysmal atrial fibrillation: Secondary | ICD-10-CM

## 2015-11-18 DIAGNOSIS — I2510 Atherosclerotic heart disease of native coronary artery without angina pectoris: Secondary | ICD-10-CM

## 2015-11-18 DIAGNOSIS — I4891 Unspecified atrial fibrillation: Secondary | ICD-10-CM

## 2015-11-18 NOTE — Patient Instructions (Signed)
Medication Instructions:  Your physician recommends that you continue on your current medications as directed. Please refer to the Current Medication list given to you today.   Labwork: None ordered  Testing/Procedures: None ordered  Follow-Up: Your physician wants you to follow-up in: 6 months with Dr.Jordan You will receive a reminder letter in the mail two months in advance. If you don't receive a letter, please call our office to schedule the follow-up appointment.   Any Other Special Instructions Will Be Listed Below (If Applicable).     If you need a refill on your cardiac medications before your next appointment, please call your pharmacy.   

## 2015-11-26 ENCOUNTER — Telehealth: Payer: Self-pay | Admitting: Family Medicine

## 2015-11-26 ENCOUNTER — Ambulatory Visit (INDEPENDENT_AMBULATORY_CARE_PROVIDER_SITE_OTHER): Payer: Medicare Other | Admitting: Family Medicine

## 2015-11-26 VITALS — BP 110/80 | HR 71 | Temp 98.4°F | Ht 67.0 in | Wt 168.0 lb

## 2015-11-26 DIAGNOSIS — R251 Tremor, unspecified: Secondary | ICD-10-CM | POA: Diagnosis not present

## 2015-11-26 DIAGNOSIS — I2510 Atherosclerotic heart disease of native coronary artery without angina pectoris: Secondary | ICD-10-CM | POA: Diagnosis not present

## 2015-11-26 DIAGNOSIS — Z9861 Coronary angioplasty status: Secondary | ICD-10-CM | POA: Diagnosis not present

## 2015-11-26 DIAGNOSIS — R413 Other amnesia: Secondary | ICD-10-CM | POA: Diagnosis not present

## 2015-11-26 LAB — BASIC METABOLIC PANEL
BUN: 13 mg/dL (ref 6–23)
CALCIUM: 9 mg/dL (ref 8.4–10.5)
CO2: 27 meq/L (ref 19–32)
Chloride: 102 mEq/L (ref 96–112)
Creatinine, Ser: 1.06 mg/dL (ref 0.40–1.20)
GFR: 52.16 mL/min — ABNORMAL LOW (ref >60.00)
Glucose, Bld: 115 mg/dL — ABNORMAL HIGH (ref 70–99)
Potassium: 3.9 mEq/L (ref 3.5–5.1)
SODIUM: 137 meq/L (ref 135–145)

## 2015-11-26 LAB — CBC WITH DIFFERENTIAL/PLATELET
BASOS ABS: 0 10*3/uL (ref 0.0–0.1)
Basophils Relative: 0.7 % (ref 0.0–3.0)
Eosinophils Absolute: 0.1 10*3/uL (ref 0.0–0.7)
Eosinophils Relative: 1.2 % (ref 0.0–5.0)
HEMATOCRIT: 34.9 % — AB (ref 36.0–46.0)
HEMOGLOBIN: 11.6 g/dL — AB (ref 12.0–15.0)
LYMPHS PCT: 23.3 % (ref 12.0–46.0)
Lymphs Abs: 1.5 10*3/uL (ref 0.7–4.0)
MCHC: 33 g/dL (ref 30.0–36.0)
MCV: 82.2 fl (ref 78.0–100.0)
MONOS PCT: 8.6 % (ref 3.0–12.0)
Monocytes Absolute: 0.6 10*3/uL (ref 0.1–1.0)
NEUTROS ABS: 4.3 10*3/uL (ref 1.4–7.7)
Neutrophils Relative %: 66.2 % (ref 43.0–77.0)
PLATELETS: 227 10*3/uL (ref 150.0–400.0)
RBC: 4.25 Mil/uL (ref 3.87–5.11)
RDW: 15.9 % — ABNORMAL HIGH (ref 11.5–15.5)
WBC: 6.5 10*3/uL (ref 4.0–10.5)

## 2015-11-26 LAB — VITAMIN B12: Vitamin B-12: 697 pg/mL (ref 211–911)

## 2015-11-26 LAB — TSH: TSH: 0.78 u[IU]/mL (ref 0.35–4.50)

## 2015-11-26 NOTE — Progress Notes (Signed)
Pre visit review using our clinic review tool, if applicable. No additional management support is needed unless otherwise documented below in the visit note. 

## 2015-11-26 NOTE — Telephone Encounter (Signed)
[facility] - Client [place] Patient Name: BUFFI WITKIN DOB: 1926-07-24 Initial Comment Caller states her mother is c/o violent shaking and upset stomach. No vomiting or diarrhea. Nurse Assessment Nurse: Dimas Chyle, RN, Dellis Filbert Date/Time Eilene Ghazi Time): 11/26/2015 8:29:33 AM Confirm and document reason for call. If symptomatic, describe symptoms. You must click the next button to save text entered. ---Caller states her mother is c/o violent shaking and upset stomach. No vomiting or diarrhea. Started yesterday. Last BM was overnight. No fever. Has the patient traveled out of the country within the last 30 days? ---No Does the patient have any new or worsening symptoms? ---Yes Will a triage be completed? ---Yes Related visit to physician within the last 2 weeks? ---No Does the PT have any chronic conditions? (i.e. diabetes, asthma, etc.) ---Yes List chronic conditions. ---CAD, HTN Is this a behavioral health or substance abuse call? ---No Guidelines Guideline Title Affirmed Question Affirmed Notes [place] - Tics - Shudders Shuddering (trembling) occurs without an obvious cause Final Disposition User See PCP When Office is Open (within 3 days) Dimas Chyle, RN, [employer] Referrals REFERRED TO PCP OFFICE Disagree/Comply: Leta Baptist

## 2015-11-26 NOTE — Progress Notes (Signed)
Subjective:     Patient ID: Dominique Perez, female   DOB: April 03, 1927, 82 y.o.   MRN: [id number]  HPI Patient is seen accompanied by her daughter. She had episode yesterday after eating breakfast of diffuse tremor which lasted apparently for several hours. She denied any fevers or chills. No dysuria. No cough. No confusion. No headaches. No speech changes and no focal weakness. She states that she did not feel particularly anxious initially. No recent medication changes. She's not had any episodes today.  Daughter expresses concern for short-term memory lapses. For example, she sometimes has conversation with her mother and 5 minutes later she repeat things they have just talked about.  Patient has multiple chronic problems including history of CAD, atrial fibrillation, hypertension, GERD, chronic kidney disease, osteoarthritis. She lives alone still cooks for herself. Ambulates with walker. No recent falls. No head injury.  Past Medical History  Diagnosis Date  . Coronary artery disease     s/p pci 1999 rca, 2007 diagonal  . Hypertension   . Tremor     chronic  . Leg pain, right     chronic  . Arthritis   . GERD (gastroesophageal reflux disease)   . Hyperlipidemia   . Gallstones   . Atrial fibrillation ([place])   . Fracture of right femur ([place])   . Atrial flutter [facility])    Past Surgical History  Procedure Laterality Date  . Knee surgery      right, titanium rod in the right femur  . Femoral pseudoaneurysm  1999    repair  . Cardiac catheterization  1999     PCI to RCA  . Cardiac catheterization  2007    PCI to diagonal  . Cardiac catheterization  2002    treated medically  . Total hip arthroplasty      Dr. Durward Fortes  . Cataract extraction    . Left knee  2009    replacement by Dr. Wynelle Link    reports that she has never smoked. She does not have any smokeless tobacco history on file. She reports that she does not drink alcohol. Her drug history is not on file. family history  includes Cancer in her father; Heart attack in her mother. Allergies  Allergen Reactions  . Amiodarone Other (See Comments)    tremor  . Erythromycin Stearate Other (See Comments)    REACTION: fever  . Ferrous Sulfate Other (See Comments)    REACTION: fever  . Iohexol Other (See Comments)     Code: HIVES, Desc: Pt states that 10 yrs ago she IV contrast for a CT and broke out in hives and experienced SOB.  Done w/o iv contrast today., Onset Date: [id number]   . Sulfa Antibiotics Hives     Review of Systems  Constitutional: Negative for fever, chills, appetite change and fatigue.  Eyes: Negative for visual disturbance.  Respiratory: Negative for cough, chest tightness, shortness of breath and wheezing.   Cardiovascular: Negative for chest pain, palpitations and leg swelling.  Gastrointestinal: Negative for nausea, vomiting, abdominal pain and diarrhea.  Genitourinary: Negative for dysuria.  Neurological: Positive for tremors (see history of present illness). Negative for dizziness, seizures, syncope, weakness, light-headedness and headaches.  Psychiatric/Behavioral: Negative for dysphoric mood.       Objective:   Physical Exam  Constitutional: She is oriented to person, place, and time. She appears well-developed and well-nourished.  Neck: Neck supple. No thyromegaly present.  Cardiovascular: Normal rate.   Pulmonary/Chest: Effort normal and breath sounds normal.  No respiratory distress. She has no wheezes.  She has a few crackles in both bases  Musculoskeletal: She exhibits no edema.  Neurological: She is alert and oriented to person, place, and time. No cranial nerve deficit. Coordination normal.  Psychiatric: She has a normal mood and affect. Her behavior is normal.  MMSE 27/30       Assessment:     #1 short-term memory loss. She has some definite cognitive impairment with MMSE 27/30.   #2 transient bilateral tremor. Did not sound as if she had any fevers or chills. No  further episodes today    Plan:     -Check labs with TSH, [id number], basic metabolic panel, and CBC -We discussed possible treatment options for memory but at this point she prefers to recheck in 4-6 months and repeat MMSE then. If declining further at that point consider medication options. -Follow-up promptly for any fever or other new symptoms  Eulas Post MD [facility]

## 2015-12-14 ENCOUNTER — Encounter (HOSPITAL_COMMUNITY): Payer: Self-pay | Admitting: Emergency Medicine

## 2015-12-14 ENCOUNTER — Emergency Department (HOSPITAL_COMMUNITY)
Admission: EM | Admit: 2015-12-14 | Discharge: 2015-12-14 | Disposition: A | Discharge status: Home / Self Care | Payer: Medicare Other | Attending: Emergency Medicine | Admitting: Emergency Medicine

## 2015-12-14 DIAGNOSIS — I2510 Atherosclerotic heart disease of native coronary artery without angina pectoris: Secondary | ICD-10-CM | POA: Diagnosis not present

## 2015-12-14 DIAGNOSIS — S161XXA Strain of muscle, fascia and tendon at neck level, initial encounter: Secondary | ICD-10-CM | POA: Diagnosis not present

## 2015-12-14 DIAGNOSIS — M542 Cervicalgia: Secondary | ICD-10-CM | POA: Diagnosis present

## 2015-12-14 DIAGNOSIS — I10 Essential (primary) hypertension: Secondary | ICD-10-CM | POA: Insufficient documentation

## 2015-12-14 DIAGNOSIS — Y929 Unspecified place or not applicable: Secondary | ICD-10-CM | POA: Insufficient documentation

## 2015-12-14 DIAGNOSIS — Y939 Activity, unspecified: Secondary | ICD-10-CM | POA: Diagnosis not present

## 2015-12-14 DIAGNOSIS — X58XXXA Exposure to other specified factors, initial encounter: Secondary | ICD-10-CM | POA: Insufficient documentation

## 2015-12-14 DIAGNOSIS — Y999 Unspecified external cause status: Secondary | ICD-10-CM | POA: Diagnosis not present

## 2015-12-14 DIAGNOSIS — Z79899 Other long term (current) drug therapy: Secondary | ICD-10-CM | POA: Diagnosis not present

## 2015-12-14 MED ORDER — ACETAMINOPHEN 325 MG PO TABS
650.0000 mg | ORAL_TABLET | Freq: Once | ORAL | Status: AC
  Administered 2015-12-14: 650 mg via ORAL
  Filled 2015-12-14: qty 2

## 2015-12-14 NOTE — ED Triage Notes (Signed)
Pt complaint of neck pain onset yesterday throbbing and sore in nature; denies fevers.

## 2015-12-14 NOTE — ED Provider Notes (Signed)
[facility] DEPT Provider Note   CSN: [id number] Arrival date & time: 12/14/15  1039  First Provider Contact:  First MD Initiated Contact with Patient 12/14/15 1416        History   Chief Complaint Chief Complaint  Patient presents with  . Neck Pain    HPI Dominique Perez is a 82 y.o. female.  The history is provided by the patient.    CC: neck pain  Onset/Duration: 2 days Timing: constant Location: nuchal region Quality: aching Severity: moderate Modifying Factors:  Improved by: lying still  Worsened by: moving her neck Associated Signs/Symptoms:  Pertinent (+): nothing  Pertinent (-): fever, chest pain, sob, n/v, trouble swallowing, cough Context: poor posture. Reports pain started when daughter was trying to take her shirt off several days ago. No fall or trauma.    Past Medical History:  Diagnosis Date  . Arthritis   . Atrial fibrillation ([place])   . Atrial flutter ([place])   . Coronary artery disease    s/p pci 1999 rca, 2007 diagonal  . Fracture of right femur ([place])   . Gallstones   . GERD (gastroesophageal reflux disease)   . Hyperlipidemia   . Hypertension   . Leg pain, right    chronic  . Tremor    chronic    Patient Active Problem List   Diagnosis Date Noted  . Atrial flutter ([place]) 04/13/2014  . Chronic anticoagulation 03/27/2014  . Osteoarthritis of multiple joints 09/04/2011  . CKD (chronic kidney disease) stage 3, GFR 30-59 ml/min 09/04/2011  . Bradycardia 02/17/2011  . PAF (paroxysmal atrial fibrillation) ([place])   . Hypertension   . Leg pain, right   . Arthritis   . GERD (gastroesophageal reflux disease)   . Hyperlipidemia   . Gallstones   . ASTEATOTIC ECZEMA 06/18/2009  . CAD S/P percutaneous coronary angioplasty 12/20/2008  . ASTHMA 12/20/2008  . GERD 12/20/2008  . DECUBITUS ULCER, BUTTOCK 12/20/2008  . COUGH, CHRONIC 12/20/2008    Past Surgical History:  Procedure Laterality Date  . CARDIAC CATHETERIZATION  1999    PCI to  RCA  . CARDIAC CATHETERIZATION  2007   PCI to diagonal  . CARDIAC CATHETERIZATION  2002   treated medically  . CATARACT EXTRACTION    . femoral pseudoaneurysm  1999   repair  . KNEE SURGERY     right, titanium rod in the right femur  . left knee  2009   replacement by Dr. Wynelle Link  . TOTAL HIP ARTHROPLASTY     Dr. Durward Fortes    OB History    No data available       Home Medications    Prior to Admission medications   Medication Sig Start Date End Date Taking? Authorizing Provider  dronedarone (MULTAQ) 400 MG tablet TAKE 1 BY MOUTH TWICE DAILY WITH A MEAL 06/12/15  Yes Eulas Post, MD  gabapentin (NEURONTIN) 100 MG capsule Take 100 mg by mouth daily.    Yes Historical Provider, MD  isosorbide mononitrate (IMDUR) 60 MG 24 hr tablet Take 1 tablet (60 mg total) by mouth daily. 08/08/15  Yes Peter M Martinique, MD  lansoprazole (PREVACID) 30 MG capsule Take 1 capsule (30 mg total) by mouth daily at 12 noon. NEED OV. Patient taking differently: Take 30 mg by mouth every evening. NEED OV. 08/19/15  Yes Peter M Martinique, MD  metoprolol tartrate (LOPRESSOR) 25 MG tablet Take 1 tablet (25 mg total) by mouth 3 (three) times daily. Patient taking differently: Take  25 mg by mouth 2 (two) times daily.  08/09/15  Yes Peter M Martinique, MD  Rivaroxaban Alveda Reasons) 15 MG TABS tablet Take 1 tablet by mouth every morning with breakfast 09/13/15  Yes Peter M Martinique, MD  simvastatin (ZOCOR) 20 MG tablet Take 0.5 tablets (10 mg total) by mouth daily at 6 PM. NEED OV. 08/19/15  Yes Peter M Martinique, MD  traMADol-acetaminophen (ULTRACET) 37.5-325 MG tablet TAKE 1 TABLET BY MOUTH EVERY 6 HOURS AS NEEDED FOR PAIN Patient taking differently: Take 1 tablet by mouth daiy 10/29/15  Yes Eulas Post, MD    Family History Family History  Problem Relation Age of Onset  . Heart attack Mother   . Cancer Father     Social History Social History  Substance Use Topics  . Smoking status: Never Smoker  . Smokeless  tobacco: Not on file  . Alcohol use No     Allergies   Amiodarone; Erythromycin stearate; Ferrous sulfate; Iohexol; and Sulfa antibiotics   Review of Systems Review of Systems  Constitutional: Negative for chills, fatigue and fever.  HENT: Negative for congestion and sore throat.   Eyes: Negative for visual disturbance.  Respiratory: Negative for cough, chest tightness and shortness of breath.   Cardiovascular: Negative for chest pain and palpitations.  Gastrointestinal: Negative for abdominal pain, blood in stool, diarrhea, nausea and vomiting.  Genitourinary: Negative for difficulty urinating.  Musculoskeletal: Positive for neck pain. Negative for back pain, gait problem and neck stiffness.  Skin: Negative for rash.  Neurological: Negative for dizziness, weakness, light-headedness and headaches.     Physical Exam Updated Vital Signs BP 173/77   Pulse 65   Temp 98.7 F (37.1 C)   Resp 23   Ht 5\' 6"  (1.676 m)   Wt 163 lb (73.9 kg)   SpO2 96%   BMI 26.31 kg/m   Physical Exam  Constitutional: She is oriented to person, place, and time. She appears well-developed and well-nourished. No distress.  HENT:  Head: Normocephalic and atraumatic.  Right Ear: External ear normal.  Left Ear: External ear normal.  Nose: Nose normal.  Eyes: Conjunctivae and EOM are normal. Pupils are equal, round, and reactive to light. Right eye exhibits no discharge. Left eye exhibits no discharge. No scleral icterus.  Neck: Normal range of motion. Neck supple. Normal carotid pulses and no JVD present. Tracheal tenderness present. Carotid bruit is not present. No Brudzinski's sign and no Kernig's sign noted.    Cardiovascular: Normal rate, regular rhythm and normal heart sounds.  Exam reveals no gallop and no friction rub.   No murmur heard. Pulmonary/Chest: Effort normal and breath sounds normal. No stridor. No respiratory distress. She has no wheezes.  Abdominal: Soft. She exhibits no  distension. There is no tenderness.  Musculoskeletal: She exhibits no edema or tenderness.  Kyphotic posture  Neurological: She is alert and oriented to person, place, and time.  Skin: Skin is warm and dry. No rash noted. She is not diaphoretic. No erythema.  Psychiatric: She has a normal mood and affect.     ED Treatments / Results  Labs (all labs ordered are listed, but only abnormal results are displayed) Labs Reviewed - No data to display  EKG  EKG Interpretation None       Radiology No results found.  Procedures Procedures (including critical care time)  Medications Ordered in ED Medications  acetaminophen (TYLENOL) tablet 650 mg (650 mg Oral Given 12/14/15 1529)     Initial Impression / Assessment  and Plan / ED Course  I have reviewed the triage vital signs and the nursing notes.  Pertinent labs & imaging results that were available during my care of the patient were reviewed by me and considered in my medical decision making (see chart for details).  Clinical Course    Presentation most consistent with muscle strain. No suspicion for cardiac etiology, meningitis, carotid dissection. No history of trauma, doubt fracture. Offered to obtain plain films to the patient and daughter however they declined. No evidence to warrant additional diagnostic studies.  Patient is safe for discharge with strict return precautions. Patient to follow-up with her PCP as needed.  Final Clinical Impressions(s) / ED Diagnoses   Final diagnoses:  Neck strain, initial encounter   Disposition: Discharge  Condition: Good  I have discussed the results, Dx and Tx plan with the patient who expressed understanding and agree(s) with the plan. Discharge instructions discussed at great length. The patient and daughter was given strict return precautions who verbalized understanding of the instructions. No further questions at time of discharge.    Discharge Medication List as of 12/14/2015   3:14 PM      Follow Up: Eulas Post, MD [place] [id number] [phone]  Schedule an appointment as soon as possible for a visit  If symptoms do not improve in 5-7 days      Fatima Blank, MD 12/14/15 1729

## 2015-12-14 NOTE — ED Notes (Signed)
Patient c/o of neck pain. No injury. Patient states she has a hx of arthritis. Patient changed out in gown.

## 2015-12-17 ENCOUNTER — Emergency Department (HOSPITAL_COMMUNITY): Payer: Medicare Other

## 2015-12-17 ENCOUNTER — Encounter (HOSPITAL_COMMUNITY): Payer: Self-pay | Admitting: Emergency Medicine

## 2015-12-17 ENCOUNTER — Observation Stay (HOSPITAL_COMMUNITY)
Admission: EM | Admit: 2015-12-17 | Discharge: 2015-12-17 | Disposition: A | Discharge status: Home / Self Care | Payer: Medicare Other | Attending: Family Medicine | Admitting: Family Medicine

## 2015-12-17 DIAGNOSIS — N183 Chronic kidney disease, stage 3 unspecified: Secondary | ICD-10-CM | POA: Diagnosis present

## 2015-12-17 DIAGNOSIS — K219 Gastro-esophageal reflux disease without esophagitis: Secondary | ICD-10-CM | POA: Diagnosis not present

## 2015-12-17 DIAGNOSIS — Z96649 Presence of unspecified artificial hip joint: Secondary | ICD-10-CM | POA: Diagnosis not present

## 2015-12-17 DIAGNOSIS — M1990 Unspecified osteoarthritis, unspecified site: Secondary | ICD-10-CM | POA: Diagnosis present

## 2015-12-17 DIAGNOSIS — I480 Paroxysmal atrial fibrillation: Principal | ICD-10-CM | POA: Insufficient documentation

## 2015-12-17 DIAGNOSIS — Z7901 Long term (current) use of anticoagulants: Secondary | ICD-10-CM | POA: Diagnosis not present

## 2015-12-17 DIAGNOSIS — I4891 Unspecified atrial fibrillation: Secondary | ICD-10-CM | POA: Diagnosis present

## 2015-12-17 DIAGNOSIS — Z955 Presence of coronary angioplasty implant and graft: Secondary | ICD-10-CM | POA: Diagnosis not present

## 2015-12-17 DIAGNOSIS — Z9861 Coronary angioplasty status: Secondary | ICD-10-CM

## 2015-12-17 DIAGNOSIS — R000 Tachycardia, unspecified: Secondary | ICD-10-CM | POA: Diagnosis not present

## 2015-12-17 DIAGNOSIS — R002 Palpitations: Secondary | ICD-10-CM | POA: Diagnosis not present

## 2015-12-17 DIAGNOSIS — I129 Hypertensive chronic kidney disease with stage 1 through stage 4 chronic kidney disease, or unspecified chronic kidney disease: Secondary | ICD-10-CM | POA: Diagnosis not present

## 2015-12-17 DIAGNOSIS — R0602 Shortness of breath: Secondary | ICD-10-CM | POA: Diagnosis not present

## 2015-12-17 DIAGNOSIS — M159 Polyosteoarthritis, unspecified: Secondary | ICD-10-CM | POA: Diagnosis present

## 2015-12-17 DIAGNOSIS — I482 Chronic atrial fibrillation: Secondary | ICD-10-CM | POA: Diagnosis not present

## 2015-12-17 DIAGNOSIS — I10 Essential (primary) hypertension: Secondary | ICD-10-CM | POA: Diagnosis present

## 2015-12-17 DIAGNOSIS — E785 Hyperlipidemia, unspecified: Secondary | ICD-10-CM | POA: Diagnosis present

## 2015-12-17 DIAGNOSIS — I2510 Atherosclerotic heart disease of native coronary artery without angina pectoris: Secondary | ICD-10-CM | POA: Diagnosis not present

## 2015-12-17 LAB — URINE MICROSCOPIC-ADD ON: WBC, UA: NONE SEEN WBC/hpf (ref 0–5)

## 2015-12-17 LAB — URINALYSIS, ROUTINE W REFLEX MICROSCOPIC
BILIRUBIN URINE: NEGATIVE
Glucose, UA: NEGATIVE mg/dL
Ketones, ur: 15 mg/dL — AB
Leukocytes, UA: NEGATIVE
NITRITE: NEGATIVE
Protein, ur: NEGATIVE mg/dL
SPECIFIC GRAVITY, URINE: 1.01 (ref 1.005–1.030)
pH: 5 (ref 5.0–8.0)

## 2015-12-17 LAB — CBC WITH DIFFERENTIAL/PLATELET
Basophils Absolute: 0 10*3/uL (ref 0.0–0.1)
Basophils Relative: 1 %
EOS PCT: 1 %
Eosinophils Absolute: 0.1 10*3/uL (ref 0.0–0.7)
HEMATOCRIT: 38.1 % (ref 36.0–46.0)
Hemoglobin: 12 g/dL (ref 12.0–15.0)
LYMPHS PCT: 21 %
Lymphs Abs: 1.5 10*3/uL (ref 0.7–4.0)
MCH: 26.8 pg (ref 26.0–34.0)
MCHC: 31.5 g/dL (ref 30.0–36.0)
MCV: 85 fL (ref 78.0–100.0)
MONO ABS: 0.8 10*3/uL (ref 0.1–1.0)
MONOS PCT: 11 %
NEUTROS ABS: 4.6 10*3/uL (ref 1.7–7.7)
Neutrophils Relative %: 66 %
PLATELETS: 192 10*3/uL (ref 150–400)
RBC: 4.48 MIL/uL (ref 3.87–5.11)
RDW: 15.1 % (ref 11.5–15.5)
WBC: 7 10*3/uL (ref 4.0–10.5)

## 2015-12-17 LAB — BASIC METABOLIC PANEL
Anion gap: 11 (ref 5–15)
BUN: 12 mg/dL (ref 6–20)
CALCIUM: 8.7 mg/dL — AB (ref 8.9–10.3)
CO2: 21 mmol/L — AB (ref 22–32)
CREATININE: 1.04 mg/dL — AB (ref 0.44–1.00)
Chloride: 107 mmol/L (ref 101–111)
GFR calc Af Amer: 55 mL/min — ABNORMAL LOW (ref >60)
GFR calc non Af Amer: 47 mL/min — ABNORMAL LOW (ref >60)
GLUCOSE: 103 mg/dL — AB (ref 65–99)
Potassium: 3.8 mmol/L (ref 3.5–5.1)
Sodium: 139 mmol/L (ref 135–145)

## 2015-12-17 LAB — I-STAT TROPONIN, ED: TROPONIN I, POC: 0.02 ng/mL (ref 0.00–0.08)

## 2015-12-17 LAB — I-STAT CHEM 8, ED
BUN: 13 mg/dL (ref 6–20)
CALCIUM ION: 1.05 mmol/L — AB (ref 1.12–1.23)
CREATININE: 1 mg/dL (ref 0.44–1.00)
Chloride: 106 mmol/L (ref 101–111)
GLUCOSE: 103 mg/dL — AB (ref 65–99)
HEMATOCRIT: 37 % (ref 36.0–46.0)
HEMOGLOBIN: 12.6 g/dL (ref 12.0–15.0)
Potassium: 3.9 mmol/L (ref 3.5–5.1)
Sodium: 142 mmol/L (ref 135–145)
TCO2: 23 mmol/L (ref 0–100)

## 2015-12-17 LAB — TSH: TSH: 0.835 u[IU]/mL (ref 0.350–4.500)

## 2015-12-17 LAB — MAGNESIUM: Magnesium: 2.2 mg/dL (ref 1.7–2.4)

## 2015-12-17 MED ORDER — PANTOPRAZOLE SODIUM 40 MG PO TBEC
40.0000 mg | DELAYED_RELEASE_TABLET | Freq: Every day | ORAL | Status: DC
  Administered 2015-12-17: 40 mg via ORAL
  Filled 2015-12-17: qty 1

## 2015-12-17 MED ORDER — DILTIAZEM HCL 25 MG/5ML IV SOLN
10.0000 mg | Freq: Once | INTRAVENOUS | Status: AC
  Administered 2015-12-17: 10 mg via INTRAVENOUS

## 2015-12-17 MED ORDER — ISOSORBIDE MONONITRATE ER 60 MG PO TB24
60.0000 mg | ORAL_TABLET | Freq: Every day | ORAL | Status: DC
  Administered 2015-12-17: 60 mg via ORAL
  Filled 2015-12-17: qty 1

## 2015-12-17 MED ORDER — GABAPENTIN 100 MG PO CAPS
100.0000 mg | ORAL_CAPSULE | Freq: Every day | ORAL | Status: DC
  Administered 2015-12-17: 100 mg via ORAL
  Filled 2015-12-17: qty 1

## 2015-12-17 MED ORDER — METOPROLOL TARTRATE 25 MG PO TABS
25.0000 mg | ORAL_TABLET | Freq: Two times a day (BID) | ORAL | Status: DC
  Administered 2015-12-17: 25 mg via ORAL
  Filled 2015-12-17: qty 1

## 2015-12-17 MED ORDER — TRAZODONE HCL 50 MG PO TABS: 25.0000 mg | ORAL_TABLET | Freq: Every evening | ORAL | Status: DC | PRN

## 2015-12-17 MED ORDER — ONDANSETRON HCL 4 MG PO TABS: 4.0000 mg | ORAL_TABLET | Freq: Four times a day (QID) | ORAL | Status: DC | PRN

## 2015-12-17 MED ORDER — SIMVASTATIN 10 MG PO TABS: 10.0000 mg | ORAL_TABLET | Freq: Every day | ORAL | Status: DC

## 2015-12-17 MED ORDER — RIVAROXABAN 15 MG PO TABS
15.0000 mg | ORAL_TABLET | Freq: Every day | ORAL | Status: DC
  Administered 2015-12-17: 15 mg via ORAL
  Filled 2015-12-17: qty 1

## 2015-12-17 MED ORDER — SODIUM CHLORIDE 0.9 % IV SOLN
INTRAVENOUS | Status: DC
  Administered 2015-12-17: 06:00:00 via INTRAVENOUS

## 2015-12-17 MED ORDER — ASPIRIN 81 MG PO CHEW
324.0000 mg | CHEWABLE_TABLET | Freq: Once | ORAL | Status: AC
  Administered 2015-12-17: 324 mg via ORAL
  Filled 2015-12-17: qty 4

## 2015-12-17 MED ORDER — DILTIAZEM HCL 100 MG IV SOLR
5.0000 mg/h | Freq: Once | INTRAVENOUS | Status: DC
  Filled 2015-12-17: qty 100

## 2015-12-17 MED ORDER — SODIUM CHLORIDE 0.9% FLUSH: 3.0000 mL | Freq: Two times a day (BID) | INTRAVENOUS | Status: DC

## 2015-12-17 MED ORDER — SODIUM CHLORIDE 0.9 % IV SOLN: INTRAVENOUS | Status: DC

## 2015-12-17 MED ORDER — BISACODYL 5 MG PO TBEC: 5.0000 mg | DELAYED_RELEASE_TABLET | Freq: Every day | ORAL | Status: DC | PRN

## 2015-12-17 MED ORDER — POLYETHYLENE GLYCOL 3350 17 G PO PACK
17.0000 g | PACK | Freq: Every day | ORAL | Status: DC | PRN
Start: 2015-12-17 — End: 2015-12-17

## 2015-12-17 MED ORDER — ACETAMINOPHEN 325 MG PO TABS: 650.0000 mg | ORAL_TABLET | Freq: Four times a day (QID) | ORAL | Status: DC | PRN

## 2015-12-17 MED ORDER — ONDANSETRON HCL 4 MG/2ML IJ SOLN
4.0000 mg | Freq: Four times a day (QID) | INTRAMUSCULAR | Status: DC | PRN
Start: 2015-12-17 — End: 2015-12-17

## 2015-12-17 MED ORDER — TRAMADOL-ACETAMINOPHEN 37.5-325 MG PO TABS: 1.0000 | ORAL_TABLET | Freq: Four times a day (QID) | ORAL | Status: DC | PRN

## 2015-12-17 MED ORDER — DRONEDARONE HCL 400 MG PO TABS
400.0000 mg | ORAL_TABLET | Freq: Two times a day (BID) | ORAL | Status: DC
  Administered 2015-12-17: 400 mg via ORAL
  Filled 2015-12-17 (×2): qty 1

## 2015-12-17 MED ORDER — DEXTROSE 5 % IV SOLN
5.0000 mg/h | Freq: Once | INTRAVENOUS | Status: AC
  Administered 2015-12-17: 5 mg/h via INTRAVENOUS
  Filled 2015-12-17: qty 100

## 2015-12-17 MED ORDER — ACETAMINOPHEN 650 MG RE SUPP: 650.0000 mg | Freq: Four times a day (QID) | RECTAL | Status: DC | PRN

## 2015-12-17 NOTE — ED Notes (Signed)
Breakfast tray at bedside 

## 2015-12-17 NOTE — ED Triage Notes (Signed)
Per [facility]   Pt coming from home. Pt was sleep and woke up and felt her heart racing. Denies pain. No N/V, no dizziness, no weakness.    Afib RVR HTN  Pt is on blood thinners for an unknown reason.

## 2015-12-17 NOTE — ED Notes (Signed)
Tye Savoy, NP at bedside

## 2015-12-17 NOTE — ED Notes (Signed)
Cardiologist at bedside, MD made aware that patient is now in sinus rhythm.

## 2015-12-17 NOTE — ED Provider Notes (Signed)
TIME SEEN: 5:00 AM  CHIEF COMPLAINT: Palpitations, rapid heartbeat  HPI: Pt is a 82 y.o. female with history of atrial fibrillation who is supposed to be on Xarelto, CAD, hypertension, hyperlipidemia who presents to the emergency department with complaints feeling that she woke up not feeling well. Called her daughter who instructed her to call the hospital. States she called the hospital and at some in the hospital called 69. She states when EMS, there they found that she was having a rapid heartbeat. She denies any fevers recently, cough, vomiting or diarrhea, no dysuria or hematuria. No chest pain or shortness of breath. It appears patient is post be on multaq, metoprolol and Xarelto but she is unable to tell me if she is on these medications and if she has been compliant. She states "I just take so many medications".  ROS: See HPI Constitutional: no fever  Eyes: no drainage  ENT: no runny nose   Cardiovascular:  no chest pain  Resp: no SOB  GI: no vomiting GU: no dysuria Integumentary: no rash  Allergy: no hives  Musculoskeletal: no leg swelling  Neurological: no slurred speech ROS otherwise negative  PAST MEDICAL HISTORY/PAST SURGICAL HISTORY:  Past Medical History:  Diagnosis Date  . Arthritis   . Atrial fibrillation ([place])   . Atrial flutter ([place])   . Coronary artery disease    s/p pci 1999 rca, 2007 diagonal  . Fracture of right femur ([place])   . Gallstones   . GERD (gastroesophageal reflux disease)   . Hyperlipidemia   . Hypertension   . Leg pain, right    chronic  . Tremor    chronic    MEDICATIONS:  Prior to Admission medications   Medication Sig Start Date End Date Taking? Authorizing Provider  dronedarone (MULTAQ) 400 MG tablet TAKE 1 BY MOUTH TWICE DAILY WITH A MEAL 06/12/15  Yes Eulas Post, MD  gabapentin (NEURONTIN) 100 MG capsule Take 100 mg by mouth daily.    Yes Historical Provider, MD  isosorbide mononitrate (IMDUR) 60 MG 24 hr tablet Take 1 tablet  (60 mg total) by mouth daily. 08/08/15  Yes Peter M Martinique, MD  lansoprazole (PREVACID) 30 MG capsule Take 1 capsule (30 mg total) by mouth daily at 12 noon. NEED OV. Patient taking differently: Take 30 mg by mouth every evening. NEED OV. 08/19/15  Yes Peter M Martinique, MD  metoprolol tartrate (LOPRESSOR) 25 MG tablet Take 1 tablet (25 mg total) by mouth 3 (three) times daily. Patient taking differently: Take 25 mg by mouth 2 (two) times daily.  08/09/15  Yes Peter M Martinique, MD  Rivaroxaban Alveda Reasons) 15 MG TABS tablet Take 1 tablet by mouth every morning with breakfast 09/13/15  Yes Peter M Martinique, MD  simvastatin (ZOCOR) 20 MG tablet Take 0.5 tablets (10 mg total) by mouth daily at 6 PM. NEED OV. 08/19/15  Yes Peter M Martinique, MD  traMADol-acetaminophen (ULTRACET) 37.5-325 MG tablet TAKE 1 TABLET BY MOUTH EVERY 6 HOURS AS NEEDED FOR PAIN Patient taking differently: Take 1 tablet by mouth daiy 10/29/15  Yes Eulas Post, MD    ALLERGIES:  Allergies  Allergen Reactions  . Amiodarone Other (See Comments)    tremor  . Erythromycin Stearate Other (See Comments)    REACTION: fever  . Ferrous Sulfate Other (See Comments)    REACTION: fever  . Iohexol Other (See Comments)     Code: HIVES, Desc: Pt states that 10 yrs ago she IV contrast for a  CT and broke out in hives and experienced SOB.  Done w/o iv contrast today., Onset Date: [id number]   . Sulfa Antibiotics Hives    SOCIAL HISTORY:  Social History  Substance Use Topics  . Smoking status: Never Smoker  . Smokeless tobacco: Never Used  . Alcohol use No    FAMILY HISTORY: Family History  Problem Relation Age of Onset  . Heart attack Mother   . Cancer Father     EXAM: SpO2 95%  CONSTITUTIONAL: Alert and oriented To person and responds appropriately to questions. Elderly, in no distress, appears slightly confused HEAD: Normocephalic EYES: Conjunctivae clear, PERRL ENT: normal nose; no rhinorrhea; moist mucous membranes NECK: Supple,  no meningismus, no LAD  CARD: Irregularly irregular and tachycardic; S1 and S2 appreciated; no murmurs, no clicks, no rubs, no gallops RESP: Normal chest excursion without splinting or tachypnea; breath sounds clear and equal bilaterally; no wheezes, no rhonchi, no rales, no hypoxia or respiratory distress, speaking full sentences ABD/GI: Normal bowel sounds; non-distended; soft, non-tender, no rebound, no guarding, no peritoneal signs BACK:  The back appears normal and is non-tender to palpation, there is no CVA tenderness EXT: Normal ROM in all joints; non-tender to palpation; no edema; normal capillary refill; no cyanosis, no calf tenderness or swelling    SKIN: Normal color for age and race; warm; no rash NEURO: Moves all extremities equally, sensation to light touch intact diffusely, cranial nerves II through XII intact PSYCH: The patient's mood and manner are appropriate. Grooming and personal hygiene are appropriate.  MEDICAL DECISION MAKING: Patient here with atrial fibrillation with RVR. Patient unable to tell me if she has been compliant with Xarelto or if she is even taking it. Because of this, patient would not be a candidate for cardioversion at this time.  She is hypertensive. We'll give diltiazem bolus and drip. We'll give aspirin. We'll obtain labs, urine, chest x-ray. She will need admission.  Her CHADS-Vasc2 score is 4.    ED PROGRESS: 8:00 AM  D/w NP with split service who will see patient in place admission orders. Patient has been updated with this plan. She will be admitted to stepdown. Heart rate is still in the 140s, and atrial fibrillation. Otherwise still hemodynamically stable.   I reviewed all nursing notes, vitals, pertinent old records, EKGs, labs, imaging (as available).     EKG Interpretation  Date/Time:  Tuesday December 17 2015 04:58:25 EDT Ventricular Rate:  141 PR Interval:    QRS Duration: 82 QT Interval:  309 QTC Calculation: 474 R Axis:   -14 Text  Interpretation:  Atrial fibrillation with rapid V-rate Left ventricular hypertrophy Inferior infarct, old Anterior Q waves, possibly due to LVH No significant change since last tracing Confirmed by Viona Hosking,  DO, Khamarion Bjelland ([id number]) on 12/17/2015 5:00:48 AM         CRITICAL CARE Performed by: Nyra Jabs   Total critical care time: 35 minutes  Critical care time was exclusive of separately billable procedures and treating other patients.  Critical care was necessary to treat or prevent imminent or life-threatening deterioration.  Critical care was time spent personally by me on the following activities: development of treatment plan with patient and/or surrogate as well as nursing, discussions with consultants, evaluation of patient's response to treatment, examination of patient, obtaining history from patient or surrogate, ordering and performing treatments and interventions, ordering and review of laboratory studies, ordering and review of radiographic studies, pulse oximetry and re-evaluation of patient's condition.  [place], DO 12/17/15 [phone]

## 2015-12-17 NOTE — ED Notes (Signed)
Upon this RN's assessment, patient found to be in sinus rhythm on the monitor, EKG captured and NP [facility] made aware.

## 2015-12-17 NOTE — ED Provider Notes (Signed)
A 82 year old female seen by [facility] D.O. with cardiology consult. Please see previous provider's note for full H&P. Cardiology informed me that they have evaluated the patient and she is safe for disposition. Patient with a history of A. fib, currently on Xarelto. Patient back in sinus rhythm, asymptomatic and requesting discharge home. Patient was given strict return precautions, encouraged to make her cardiology follow-up appointment. She verbalizes understanding and agreement to today's plan and had no further questions or concerns at time of discharge  Vitals:   12/17/15 1015 12/17/15 1028  BP: 159/85   Pulse: 93 76  Resp:  16  Temp:        Okey Regal, PA-C 12/17/15 [place], PA-C 12/17/15 1110    Ward, Delice Bison, DO 03/30/18 2309

## 2015-12-17 NOTE — ED Notes (Signed)
ptar at bedside. 

## 2015-12-17 NOTE — ED Notes (Signed)
3w and bed control made aware that patient is being discharged and will not be going to bed that was assigned to her.

## 2015-12-17 NOTE — ED Notes (Signed)
Pt reports she thought she brought shoes and a walker from home with her, pt made aware that this rn did not see any other belongings in patients room besides her clothing, keys and glasses. Pt reports she must have left shoes and walker at home when the ambulance brought her

## 2015-12-17 NOTE — Consult Note (Signed)
Patient ID: Dominique Perez MRN: [id number], DOB/AGE: Dec 12, 1926   Admit date: 12/17/2015   Reason for Consult: Atrial Fibrillation w/ RVR Requesting MD: Dr. Marily Memos, Internal Medicine   Primary Physician: Eulas Post, MD Primary Cardiologist: Dr. Martinique Electrophysiologist: Dr. Rayann Heman  Pt. Profile:  82 y/o female with h/o CAD as outlined below, PAF on AAD therapy with Multaq and chronic a/c with Xarelto, presenting to ED with symptomatic atrial fibrillation w/ RVR.  Problem List  Past Medical History:  Diagnosis Date  . Arthritis   . Atrial fibrillation ([place])   . Atrial flutter ([place])   . Coronary artery disease    s/p pci 1999 rca, 2007 diagonal  . Fracture of right femur ([place])   . Gallstones   . GERD (gastroesophageal reflux disease)   . Hyperlipidemia   . Hypertension   . Leg pain, right    chronic  . Tremor    chronic    Past Surgical History:  Procedure Laterality Date  . CARDIAC CATHETERIZATION  1999    PCI to RCA  . CARDIAC CATHETERIZATION  2007   PCI to diagonal  . CARDIAC CATHETERIZATION  2002   treated medically  . CATARACT EXTRACTION    . femoral pseudoaneurysm  1999   repair  . KNEE SURGERY     right, titanium rod in the right femur  . left knee  2009   replacement by Dr. Wynelle Link  . TOTAL HIP ARTHROPLASTY     Dr. Durward Fortes     Allergies  Allergies  Allergen Reactions  . Amiodarone Other (See Comments)    tremor  . Erythromycin Stearate Other (See Comments)    REACTION: fever  . Ferrous Sulfate Other (See Comments)    REACTION: fever  . Iohexol Other (See Comments)     Code: HIVES, Desc: Pt states that 10 yrs ago she IV contrast for a CT and broke out in hives and experienced SOB.  Done w/o iv contrast today., Onset Date: [id number]   . Sulfa Antibiotics Hives    HPI 82 y/o female, followed by Dr. Martinique and Dr. Rayann Heman, who presents to the [facility] ED with complaint of tachypalpiations that started around 3AM.   She has a history of  atrial fibrillation that has been managed with Multaq and Xarelto. She also has a history of significant for coronary disease. She is status post stenting of the right coronary in 1999. She had stenting of the diagonal in 2007 with a drug-eluting stent and has done well since on medical therapy. Her last 2D echo 02/2011 showed normal LVEF of 55-60%, mild LVH, mild MR and mild-moderate TR.  On arrival to the ED, she was noted to be in rapid atrial fibrillation. EKG shows ventricular rate in the 140s. K is WNL at 3.9. Mg also normal at 2.2. TSH normal at 0.835. BMP with normal SCr and BUN. CBC with normal WBC and normal Hgb w/o anemia. She is afebrile. CXR shows mild left basilar airspace opacity, which may reflect chronic scarring or mild recurrent pneumonia. UA is negative for UTI. She reports full medication compliance. No missed dose of Multaq, nor Xarelto. She denies any recent consumption of caffeine or ETOH. No CP, dyspnea, fever, chills, productive cough, n/v/d. She is being admitted by IM. Cardiology has been consulted for management of her atrial fibrillation.   Note: per records, AAD options are limited. She was on amiodarone in the past but this was discontinued due to intolerance. Cannot take Flecainide due  to CAD. Per office note by Dr. Rayann Heman 04/2014, "If she has further episodes would consider ablation, however 82 given her advanced age, we should try to avoid this".    Home Medications  Prior to Admission medications   Medication Sig Start Date End Date Taking? Authorizing Provider  dronedarone (MULTAQ) 400 MG tablet TAKE 1 BY MOUTH TWICE DAILY WITH A MEAL 06/12/15  Yes Eulas Post, MD  gabapentin (NEURONTIN) 100 MG capsule Take 100 mg by mouth daily.    Yes Historical Provider, MD  isosorbide mononitrate (IMDUR) 60 MG 24 hr tablet Take 1 tablet (60 mg total) by mouth daily. 08/08/15  Yes Peter M Martinique, MD  lansoprazole (PREVACID) 30 MG capsule Take 1 capsule (30 mg total) by mouth  daily at 12 noon. NEED OV. Patient taking differently: Take 30 mg by mouth every evening. NEED OV. 08/19/15  Yes Peter M Martinique, MD  metoprolol tartrate (LOPRESSOR) 25 MG tablet Take 1 tablet (25 mg total) by mouth 3 (three) times daily. Patient taking differently: Take 25 mg by mouth 2 (two) times daily.  08/09/15  Yes Peter M Martinique, MD  Rivaroxaban Alveda Reasons) 15 MG TABS tablet Take 1 tablet by mouth every morning with breakfast 09/13/15  Yes Peter M Martinique, MD  simvastatin (ZOCOR) 20 MG tablet Take 0.5 tablets (10 mg total) by mouth daily at 6 PM. NEED OV. 08/19/15  Yes Peter M Martinique, MD  traMADol-acetaminophen (ULTRACET) 37.5-325 MG tablet TAKE 1 TABLET BY MOUTH EVERY 6 HOURS AS NEEDED FOR PAIN Patient taking differently: Take 1 tablet by mouth daiy 10/29/15  Yes Eulas Post, MD   Hospital Meds . dronedarone  400 mg Oral BID WC  . gabapentin  100 mg Oral Daily  . isosorbide mononitrate  60 mg Oral Daily  . metoprolol tartrate  25 mg Oral BID  . pantoprazole  40 mg Oral Daily  . Rivaroxaban  15 mg Oral Daily  . simvastatin  10 mg Oral q1800  . sodium chloride flush  3 mL Intravenous Q12H   . sodium chloride 75 mL/hr at 12/17/15 0542  . sodium chloride    . diltiazem (CARDIZEM) infusion      Family History  Family History  Problem Relation Age of Onset  . Heart attack Mother   . Cancer Father     Social History  Social History   Social History  . Marital status: Widowed    Spouse name: N/A  . Number of children: 4  . Years of education: N/A   Occupational History  . Not on file.   Social History Main Topics  . Smoking status: Never Smoker  . Smokeless tobacco: Never Used  . Alcohol use No  . Drug use: Unknown  . Sexual activity: Not on file   Other Topics Concern  . Not on file   Social History Narrative  . No narrative on file     Review of Systems General:  No chills, fever, night sweats or weight changes.  Cardiovascular:  No chest pain, dyspnea on  exertion, edema, orthopnea, palpitations, paroxysmal nocturnal dyspnea. Dermatological: No rash, lesions/masses Respiratory: No cough, dyspnea Urologic: No hematuria, dysuria Abdominal:   No nausea, vomiting, diarrhea, bright red blood per rectum, melena, or hematemesis Neurologic:  No visual changes, wkns, changes in mental status. All other systems reviewed and are otherwise negative except as noted above.  Physical Exam  Blood pressure 144/70, pulse 120, temperature 98.1 F (36.7 C), temperature source Oral, resp. rate 16,  SpO2 95 %.  General: Pleasant, NAD, elderly  Psych: Normal affect. Neuro: Alert and oriented X 3. Moves all extremities spontaneously. HEENT: Normal  Neck: Supple without bruits or JVD. Lungs:  Resp regular and unlabored, crackles in LLL that clear after cough c/w atelectasis  Heart: irregularly irregular, tachy rate no s3, s4, or murmurs. Abdomen: Soft, non-tender, non-distended, BS + x 4.  Extremities: No clubbing, cyanosis or edema. DP/PT/Radials 2+ and equal bilaterally.  Labs  Troponin [facility] Test)  Recent Labs  12/17/15 0644  TROPIPOC 0.02   No results for input(s): CKTOTAL, CKMB, TROPONINI in the last 72 hours. Lab Results  Component Value Date   WBC 7.0 12/17/2015   HGB 12.6 12/17/2015   HCT 37.0 12/17/2015   MCV 85.0 12/17/2015   PLT 192 12/17/2015     Recent Labs Lab 12/17/15 0615 12/17/15 0619  NA 139 142  K 3.8 3.9  CL 107 106  CO2 21*  --   BUN 12 13  CREATININE 1.04* 1.00  CALCIUM 8.7*  --   GLUCOSE 103* 103*   Lab Results  Component Value Date   CHOL 161 04/26/2015   HDL 72.80 04/26/2015   LDLCALC 63 04/26/2015   TRIG 125.0 04/26/2015   No results found for: DDIMER   Radiology/Studies  Dg Chest 2 View  Result Date: 12/17/2015 CLINICAL DATA:  Acute onset of generalized chest discomfort and palpitations. Shortness of breath. Initial encounter. EXAM: CHEST  2 VIEW COMPARISON:  Chest radiograph performed  01/22/2015 FINDINGS: The lungs are well-aerated. Mild left basilar airspace opacity may reflect chronic scarring or mild recurrent pneumonia. There is no evidence of pleural effusion or pneumothorax. The heart is borderline normal in size. No acute osseous abnormalities are seen. IMPRESSION: Mild left basilar airspace opacity may reflect chronic scarring or mild recurrent pneumonia. Electronically Signed   By: Garald Balding M.D.   On: 12/17/2015 06:02    ECG  Atrial fibrillation w/ RVR 141 bpm   ASSESSMENT AND PLAN  Active Problems:   CAD S/P percutaneous coronary angioplasty   GERD   Arthritis   Hyperlipidemia   Hypertension   Osteoarthritis of multiple joints   CKD (chronic kidney disease) stage 3, GFR 30-59 ml/min   Chronic anticoagulation   Atrial fibrillation with rapid ventricular response (HCC)   Atrial fibrillation with RVR ([place])   1. Atrial Fibrillation w/ RVR: breakthrough afib while on Multaq. Patient reports full medication compliance. K, Mg, TSH, WBC, Hgb, SCr, BUN, and UA all normal. CXR shows mild left basilar airspace opacity, which may reflect chronic scarring or mild recurrent pneumonia however no clinical s/s of PNA. She has left basilar crackles that clear after cough, c/w atelectasis. Continue IV Cardizem for rate control. Ventricular rate is poorly controlled in the 140s. BP is stable. She is not highly symptomatic at rest. Continue Xarelto for a/c. Continue Multaq for now. Per records, AAD options are limited. She was on amiodarone in the past but this was discontinued due to intolerance. Cannot take Flecainide due to CAD. Per office note by Dr. Rayann Heman 04/2014, "If she has further episodes would consider ablation, however 82 given her advanced age, we should try to avoid this". ? DCCV vs change to Tikosyn, vs switch to rate control strategy only. Will defer to MD. Once rate is controlled, recommend repeating 2D echo to reassess LVF (last echo was in 2012). Doubt ischemia is  the cause for her breakthrough, given lack of CP. However given her CAD history,  recommend cycling tropoins.   2. CAD: s/p stenting of the right coronary in 1999. She had stenting of the diagonal in 2007 with a drug-eluting stent and has done well since on medical therapy. She denies CP however, given her breakthrough atrial fibrillation, recommend cycling troponins x 3 to r/o possibility of ACS. Once rate is adequately controlled, check 2D echo to assess LVF. Continue Imdur, metoprolol and simvastatin.   Signed, Lyda Jester, PA-C 12/17/2015, 9:42 AM  The patient was seen, examined and discussed with Brittainy M. Rosita Fire, PA-C and I agree with the above.   This is a very pleasant 82 year old female with prior medical history of coronary artery disease followed by Dr. Martinique, last seen in September 2016, also followed for paroxysmal atrial fibrillation with failed therapy with beta blockers, calcium channel blockers and intolerance to amiodarone. She was started on Multaq as her CAD doesn't allow her to use flecainide, she usually gets about 2-3 episodes of A. fib with RVR per year. She woke up at 3 AM this morning with palpitations came to the ER and her EKG showed A. fib with ventricular rate of 140. She denies any fever chills cough dysuria. While in the ER she spontaneously cardioverted back to sinus rhythm at 10:15 AM. She states that she was 100% compliant to her medications including Xarelto. Her labs are not suggestive of any acute infection, her chest x-ray is suggestive of possible early pneumonia however she denies any cough or fever. She was supposed to follow with Dr. Martinique in 6 months it has been 11 months and she has no appointment schedules we will schedule one today. She can be discharged from the ER, we will arrange for follow-up with Dr. Martinique or a PA. She is instructed to continue taking all of her medications as until now.  Ena Dawley 12/17/2015

## 2015-12-17 NOTE — H&P (Signed)
History and Physical    Dominique Perez [id number] DOB: [birth year] DOA: 12/17/2015  PCP: Eulas Post, MD  Patient coming from:   Home   Chief Complaint:  fast heart rate  HPI: Dominique Perez is a 82 y.o. female with a medical history significant for, but not  limited to, PAF, CAD, arthritis and GERD. Patient woke up around 3 AM, she is not exactly sure why but did notice that her heart rate was extremely fast she can feel it in her chest. No associated chest pain or shortness of breath. No dizziness. Patient got dressed, called EMS. In ED she is in A. fib with rapid RVR. Patient is compliant with all of her home medications. She does not consume caffeine. No recent illnesses, patient felt fine going to bed last night.   ED course:  Atrial fibrillation with rapid V-rate  EKG: Left ventricular hypertrophy Inferior infarct, old Anterior Q waves, possibly due to LVH No significant change since last tracing normal POC troponin. Normal TSH  Review of Systems: As per HPI, otherwise 10 point review of systems negative.    Past Medical History:  Diagnosis Date  . Arthritis   . Atrial fibrillation ([place])   . Atrial flutter ([place])   . Coronary artery disease    s/p pci 1999 rca, 2007 diagonal  . Fracture of right femur ([place])   . Gallstones   . GERD (gastroesophageal reflux disease)   . Hyperlipidemia   . Hypertension   . Leg pain, right    chronic  . Tremor    chronic    Past Surgical History:  Procedure Laterality Date  . CARDIAC CATHETERIZATION  1999    PCI to RCA  . CARDIAC CATHETERIZATION  2007   PCI to diagonal  . CARDIAC CATHETERIZATION  2002   treated medically  . CATARACT EXTRACTION    . femoral pseudoaneurysm  1999   repair  . KNEE SURGERY     right, titanium rod in the right femur  . left knee  2009   replacement by Dr. Wynelle Link  . TOTAL HIP ARTHROPLASTY     Dr. Durward Fortes    Social History   Social History  . Marital status: Widowed    Spouse name:  N/A  . Number of children: 4  . Years of education: N/A   Occupational History  . Not on file.   Social History Main Topics  . Smoking status: Never Smoker  . Smokeless tobacco: Never Used  . Alcohol use No  . Drug use: Unknown  . Sexual activity: Not on file   Other Topics Concern  . Not on file   Social History Narrative  . No narrative on file  Lives alone, uses walker for ambulation. Patient has two daughters.  Allergies  Allergen Reactions  . Amiodarone Other (See Comments)    tremor  . Erythromycin Stearate Other (See Comments)    REACTION: fever  . Ferrous Sulfate Other (See Comments)    REACTION: fever  . Iohexol Other (See Comments)     Code: HIVES, Desc: Pt states that 10 yrs ago she IV contrast for a CT and broke out in hives and experienced SOB.  Done w/o iv contrast today., Onset Date: [id number]   . Sulfa Antibiotics Hives    Family History  Problem Relation Age of Onset  . Heart attack Mother   . Cancer Father     Prior to Admission medications   Medication Sig Start Date End Date  Taking? Authorizing Provider  dronedarone (MULTAQ) 400 MG tablet TAKE 1 BY MOUTH TWICE DAILY WITH A MEAL 06/12/15  Yes Eulas Post, MD  gabapentin (NEURONTIN) 100 MG capsule Take 100 mg by mouth daily.    Yes Historical Provider, MD  isosorbide mononitrate (IMDUR) 60 MG 24 hr tablet Take 1 tablet (60 mg total) by mouth daily. 08/08/15  Yes Peter M Martinique, MD  lansoprazole (PREVACID) 30 MG capsule Take 1 capsule (30 mg total) by mouth daily at 12 noon. NEED OV. Patient taking differently: Take 30 mg by mouth every evening. NEED OV. 08/19/15  Yes Peter M Martinique, MD  metoprolol tartrate (LOPRESSOR) 25 MG tablet Take 1 tablet (25 mg total) by mouth 3 (three) times daily. Patient taking differently: Take 25 mg by mouth 2 (two) times daily.  08/09/15  Yes Peter M Martinique, MD  Rivaroxaban Alveda Reasons) 15 MG TABS tablet Take 1 tablet by mouth every morning with breakfast 09/13/15  Yes Peter  M Martinique, MD  simvastatin (ZOCOR) 20 MG tablet Take 0.5 tablets (10 mg total) by mouth daily at 6 PM. NEED OV. 08/19/15  Yes Peter M Martinique, MD  traMADol-acetaminophen (ULTRACET) 37.5-325 MG tablet TAKE 1 TABLET BY MOUTH EVERY 6 HOURS AS NEEDED FOR PAIN Patient taking differently: Take 1 tablet by mouth daiy 10/29/15  Yes Eulas Post, MD    Physical Exam: Vitals:   12/17/15 0700 12/17/15 0715 12/17/15 0730 12/17/15 0742  BP: 124/61 121/73 124/90 124/90  Pulse: 118 89 86 95  Resp: 19 21 16 25   Temp:      TempSrc:      SpO2: 96% 96% 98% 97%    Constitutional:  Pleasant white female in NAD, calm, comfortable Vitals:   12/17/15 0700 12/17/15 0715 12/17/15 0730 12/17/15 0742  BP: 124/61 121/73 124/90 124/90  Pulse: 118 89 86 95  Resp: 19 21 16 25   Temp:      TempSrc:      SpO2: 96% 96% 98% 97%   Eyes: PER, lids and conjunctivae normal ENMT: Mucous membranes are moist. Posterior pharynx clear of any exudate or lesions..  Neck: normal, supple, no masses Respiratory: clear to auscultation bilaterally, no wheezing, no crackles. Normal respiratory effort. No accessory muscle use.  Cardiovascular: Irregular rate and rhythm, afib on monitor. No murmurs. No extremity edema. 2+ dorsal pedis pulses.   Abdomen: no tenderness, no masses palpated. No hepatomegaly. Bowel sounds positive.  Musculoskeletal:  Marked deformity of right foot. Significant arthritic changes in left foot..  Skin: no rashes, lesions, ulcers.  Neurologic: CN 2-12 grossly intact. Sensation intact, Strength 5/5 in all 4.  Psychiatric: Normal judgment and insight. Alert and oriented x 3. Normal mood.    Labs on Admission: I have personally reviewed following labs and imaging studies   Urine analysis:    Component Value Date/Time   COLORURINE YELLOW 12/17/2015 [place] 12/17/2015 0517   LABSPEC 1.010 12/17/2015 0517   PHURINE 5.0 12/17/2015 0517   GLUCOSEU NEGATIVE 12/17/2015 0517   HGBUR SMALL  (A) 12/17/2015 0517   BILIRUBINUR NEGATIVE 12/17/2015 0517   KETONESUR 15 (A) 12/17/2015 0517   PROTEINUR NEGATIVE 12/17/2015 0517   UROBILINOGEN 0.2 04/21/2014 1050   NITRITE NEGATIVE 12/17/2015 0517   LEUKOCYTESUR NEGATIVE 12/17/2015 0517    Radiological Exams on Admission: Dg Chest 2 View  Result Date: 12/17/2015 CLINICAL DATA:  Acute onset of generalized chest discomfort and palpitations. Shortness of breath. Initial encounter. EXAM: CHEST  2 VIEW COMPARISON:  Chest radiograph performed 01/22/2015 FINDINGS: The lungs are well-aerated. Mild left basilar airspace opacity may reflect chronic scarring or mild recurrent pneumonia. There is no evidence of pleural effusion or pneumothorax. The heart is borderline normal in size. No acute osseous abnormalities are seen. IMPRESSION: Mild left basilar airspace opacity may reflect chronic scarring or mild recurrent pneumonia. Electronically Signed   By: Garald Balding M.D.   On: 12/17/2015 06:02    EKG: Independently reviewed.   EKG Interpretation  Date/Time:  Tuesday December 17 2015 04:58:25 EDT Ventricular Rate:  141 PR Interval:    QRS Duration: 82 QT Interval:  309 QTC Calculation: 474 R Axis:   -14 Text Interpretation:  Atrial fibrillation with rapid V-rate Left ventricular hypertrophy Inferior infarct, old Anterior Q waves, possibly due to LVH No significant change since last tracing Confirmed by WARD,  DO, KRISTEN ([id number]) on 12/17/2015 5:00:48 AM      02/2011 Echo. Study Conclusions  - Left ventricle: The cavity size was normal. Wall thickness was increased in a pattern of mild LVH. Systolic function was normal. The estimated ejection fraction was in the range of 55% to 60%. - Mitral valve: Nodular calcification of anterior leaflet tip Mild regurgitation. - Left atrium: The atrium was mildly dilated. - Atrial septum: No defect or patent foramen ovale was identified. - Tricuspid valve: Mild-moderate regurgitation. -  Pulmonary arteries: PA peak pressure: 60mm Hg (S). - Pericardium, extracardiac: A trivial pericardial effusion was identified.  Assessment/Plan      Hx of PAF. Chadsvasc 2. Currently in AFib with RVR. Compliant with home meds,  no caffeine use, normal TSH. Mild recurrent left PNA not excluded on CXR which could be precipitating factor for Afib with RVR. She is coughing intermittently but afebrile and normal WBC. -OBS-Stepdown -Atrial Fib order set utilized -Cardiology consult placed -Continue Diltiazem gtt -Continue home Xarelto, Metoprolol and Multaq -2view CXR in am  Hypertension, BP controlled in ED. . -Continue home BP meds  CKD 3. Cr 1.04, at baseline -Minimize nephrotoxic medications -A.m. Labs  CAD, remote stent placement. No chest pain -continue statin, imdur, beta blocker  Hyperlipidemia. . -Continue home on statin.  Marland Kitchen  GERD, asymptomatic on home PPI.   . -Continue daily PPI                                   DVT prophylaxis:    On xarelto  Code Status:     Full code  Family Communication:    None but patient gives permission to speak with either one of her daughters at any time. .  Disposition Plan:   Discharge home in 24-48 hours              Consults called:  Cardiology  Admission status:   Observation -  stepdown   Tye Savoy NP [facility] Pager [phone]  If 7PM-7AM, please contact night-coverage [email or web address] Password [email or web address]  12/17/2015, 7:55 AM

## 2015-12-17 NOTE — Discharge Instructions (Signed)
Please follow-up with your cardiologist for reevaluation and further management. Please return immediately if he experienced any new or worsening signs or symptoms.

## 2015-12-17 NOTE — ED Notes (Signed)
Dr. Marily Memos advised this RN to titrate cardizem up at this time

## 2015-12-17 NOTE — Progress Notes (Signed)
Patient converted to NSR on Diltiazem gtt. Cardiology evaluated patient (while still in the emergency department) and felt she was stable for discharge from the ED. Admission orders were therefore cancelled. Patient was given a follow up Cardiology appointment. EDP gave her strict parameters to return to ED in the interim.

## 2015-12-21 ENCOUNTER — Other Ambulatory Visit: Payer: Self-pay | Admitting: Family Medicine

## 2015-12-23 NOTE — Telephone Encounter (Signed)
Refills OK. 

## 2015-12-23 NOTE — Telephone Encounter (Signed)
No pending appt.  Last seen 11-26-2015 Last refill 10/29/2015 #120, 0rf

## 2015-12-24 ENCOUNTER — Telehealth (HOSPITAL_COMMUNITY): Payer: Self-pay | Admitting: *Deleted

## 2015-12-24 NOTE — Telephone Encounter (Signed)
I called pt to schedule a f/u from recent ED visit.  Pt stated that her daughter was out of town and would be back next week to call us and make that appt.  She would be bringing her.

## 2015-12-30 ENCOUNTER — Other Ambulatory Visit: Payer: Self-pay | Admitting: *Deleted

## 2015-12-30 MED ORDER — SIMVASTATIN 20 MG PO TABS: 10.0000 mg | ORAL_TABLET | Freq: Every day | ORAL | 1 refills | Status: DC

## 2015-12-30 MED ORDER — METOPROLOL TARTRATE 25 MG PO TABS: 25.0000 mg | ORAL_TABLET | Freq: Two times a day (BID) | ORAL | 1 refills | Status: DC

## 2016-01-03 ENCOUNTER — Other Ambulatory Visit: Payer: Self-pay

## 2016-01-03 MED ORDER — DRONEDARONE HCL 400 MG PO TABS: ORAL_TABLET | ORAL | 0 refills | Status: DC

## 2016-01-03 MED ORDER — DRONEDARONE HCL 400 MG PO TABS: ORAL_TABLET | ORAL | 6 refills | Status: DC

## 2016-01-06 ENCOUNTER — Other Ambulatory Visit: Payer: Self-pay | Admitting: Cardiology

## 2016-01-06 MED ORDER — SIMVASTATIN 20 MG PO TABS: 10.0000 mg | ORAL_TABLET | Freq: Every day | ORAL | 1 refills | Status: DC

## 2016-01-06 MED ORDER — DRONEDARONE HCL 400 MG PO TABS: ORAL_TABLET | ORAL | 1 refills | Status: DC

## 2016-01-06 NOTE — Telephone Encounter (Signed)
°*  STAT* If patient is at the pharmacy, call can be transferred to refill team.   1. Which medications need to be refilled? (please list name of each medication and dose if known) Simvastatin 20 mg, and Multaq 400 mg 2. Which pharmacy/location (including street and city if local pharmacy) is medication to be sent to?[facility] [phone]  3. Do they need a 30 day or 90 day supply? 90 and refills

## 2016-01-06 NOTE — Telephone Encounter (Signed)
Rx(s) sent to pharmacy electronically.  

## 2016-01-07 ENCOUNTER — Other Ambulatory Visit: Payer: Self-pay

## 2016-01-10 ENCOUNTER — Telehealth: Payer: Self-pay | Admitting: Family Medicine

## 2016-01-10 NOTE — Telephone Encounter (Signed)
Daughter would like to know if Dr Elease Hashimoto will order OT for pt or home health evaluation. Daughter feels like pt may need some help in order to stay in her home a little longer.

## 2016-01-11 NOTE — Telephone Encounter (Signed)
OK to order home health PT and OT evaluation.

## 2016-01-14 ENCOUNTER — Encounter: Payer: Self-pay | Admitting: *Deleted

## 2016-01-14 NOTE — Telephone Encounter (Signed)
Spoke with patients daughter. Requesting evaluation of home for safety and possibly getting help for patient with bathing. Faxed over request to [facility], fax #: [phone]. Informed daughter that order will be sent and Home Care should be in touch with her to schedule.

## 2016-01-15 ENCOUNTER — Telehealth: Payer: Self-pay | Admitting: Family Medicine

## 2016-01-15 DIAGNOSIS — R251 Tremor, unspecified: Secondary | ICD-10-CM

## 2016-01-15 DIAGNOSIS — N183 Chronic kidney disease, stage 3 unspecified: Secondary | ICD-10-CM

## 2016-01-15 DIAGNOSIS — R413 Other amnesia: Secondary | ICD-10-CM

## 2016-01-15 DIAGNOSIS — I4891 Unspecified atrial fibrillation: Secondary | ICD-10-CM

## 2016-01-15 DIAGNOSIS — M159 Polyosteoarthritis, unspecified: Secondary | ICD-10-CM

## 2016-01-15 NOTE — Telephone Encounter (Signed)
Ledell Noss this is in reference to a home health referral that you had faxed in.

## 2016-01-15 NOTE — Telephone Encounter (Signed)
Home health referral put in the computer and Floyd at [facility] notified.

## 2016-01-17 ENCOUNTER — Telehealth: Payer: Self-pay | Admitting: Family Medicine

## 2016-01-17 DIAGNOSIS — R413 Other amnesia: Secondary | ICD-10-CM | POA: Diagnosis not present

## 2016-01-17 DIAGNOSIS — N183 Chronic kidney disease, stage 3 (moderate): Secondary | ICD-10-CM | POA: Diagnosis not present

## 2016-01-17 DIAGNOSIS — K219 Gastro-esophageal reflux disease without esophagitis: Secondary | ICD-10-CM | POA: Diagnosis not present

## 2016-01-17 DIAGNOSIS — J45909 Unspecified asthma, uncomplicated: Secondary | ICD-10-CM | POA: Diagnosis not present

## 2016-01-17 DIAGNOSIS — I129 Hypertensive chronic kidney disease with stage 1 through stage 4 chronic kidney disease, or unspecified chronic kidney disease: Secondary | ICD-10-CM | POA: Diagnosis not present

## 2016-01-17 DIAGNOSIS — R251 Tremor, unspecified: Secondary | ICD-10-CM | POA: Diagnosis not present

## 2016-01-17 DIAGNOSIS — M1990 Unspecified osteoarthritis, unspecified site: Secondary | ICD-10-CM | POA: Diagnosis not present

## 2016-01-17 DIAGNOSIS — E785 Hyperlipidemia, unspecified: Secondary | ICD-10-CM | POA: Diagnosis not present

## 2016-01-17 DIAGNOSIS — I2510 Atherosclerotic heart disease of native coronary artery without angina pectoris: Secondary | ICD-10-CM | POA: Diagnosis not present

## 2016-01-17 DIAGNOSIS — Z8781 Personal history of (healed) traumatic fracture: Secondary | ICD-10-CM | POA: Diagnosis not present

## 2016-01-17 DIAGNOSIS — I480 Paroxysmal atrial fibrillation: Secondary | ICD-10-CM | POA: Diagnosis not present

## 2016-01-17 NOTE — Telephone Encounter (Signed)
Spoke with Janett Billow at [facility] and let her know that patients daughter, Caryl Asp had requested [facility] to come in. Referral had been sent to [facility] by mistake, patient is established with [facility]. Also called Joy and let her know that her mom is frustrated with the phone calls and that she knows nothing about this referral. Daughter was very understanding and thanked me for the "heads up". Not sure why they called the patient. Order stated to contact the daughter, Caryl Asp, and gave her number. Situation is resolved with Janett Billow and Joy.

## 2016-01-17 NOTE — Telephone Encounter (Signed)
Dominique Perez would like to know why does pt needs home health. Pt was unaware of the referral

## 2016-01-20 ENCOUNTER — Telehealth: Payer: Self-pay | Admitting: Family Medicine

## 2016-01-20 DIAGNOSIS — R251 Tremor, unspecified: Secondary | ICD-10-CM | POA: Diagnosis not present

## 2016-01-20 DIAGNOSIS — I129 Hypertensive chronic kidney disease with stage 1 through stage 4 chronic kidney disease, or unspecified chronic kidney disease: Secondary | ICD-10-CM | POA: Diagnosis not present

## 2016-01-20 DIAGNOSIS — N183 Chronic kidney disease, stage 3 (moderate): Secondary | ICD-10-CM | POA: Diagnosis not present

## 2016-01-20 DIAGNOSIS — M1990 Unspecified osteoarthritis, unspecified site: Secondary | ICD-10-CM | POA: Diagnosis not present

## 2016-01-20 DIAGNOSIS — I2510 Atherosclerotic heart disease of native coronary artery without angina pectoris: Secondary | ICD-10-CM | POA: Diagnosis not present

## 2016-01-20 DIAGNOSIS — R413 Other amnesia: Secondary | ICD-10-CM | POA: Diagnosis not present

## 2016-01-20 NOTE — Telephone Encounter (Signed)
[facility] wanted to report the when they went out to do their skill nursing and PT it was noticed that the pt had complication/or inter action to [place].

## 2016-01-20 NOTE — Telephone Encounter (Signed)
Home Health is aware.

## 2016-01-20 NOTE — Telephone Encounter (Signed)
Please advise 

## 2016-01-20 NOTE — Telephone Encounter (Signed)
multaq is prescribed through Cardiology and this would have to be addressed by them.Marland Kitchen

## 2016-01-21 DIAGNOSIS — R251 Tremor, unspecified: Secondary | ICD-10-CM | POA: Diagnosis not present

## 2016-01-21 DIAGNOSIS — N183 Chronic kidney disease, stage 3 (moderate): Secondary | ICD-10-CM | POA: Diagnosis not present

## 2016-01-21 DIAGNOSIS — I2510 Atherosclerotic heart disease of native coronary artery without angina pectoris: Secondary | ICD-10-CM | POA: Diagnosis not present

## 2016-01-21 DIAGNOSIS — M1990 Unspecified osteoarthritis, unspecified site: Secondary | ICD-10-CM | POA: Diagnosis not present

## 2016-01-21 DIAGNOSIS — I129 Hypertensive chronic kidney disease with stage 1 through stage 4 chronic kidney disease, or unspecified chronic kidney disease: Secondary | ICD-10-CM | POA: Diagnosis not present

## 2016-01-21 DIAGNOSIS — R413 Other amnesia: Secondary | ICD-10-CM | POA: Diagnosis not present

## 2016-01-22 DIAGNOSIS — R413 Other amnesia: Secondary | ICD-10-CM | POA: Diagnosis not present

## 2016-01-22 DIAGNOSIS — N183 Chronic kidney disease, stage 3 (moderate): Secondary | ICD-10-CM | POA: Diagnosis not present

## 2016-01-22 DIAGNOSIS — R251 Tremor, unspecified: Secondary | ICD-10-CM | POA: Diagnosis not present

## 2016-01-22 DIAGNOSIS — I2510 Atherosclerotic heart disease of native coronary artery without angina pectoris: Secondary | ICD-10-CM | POA: Diagnosis not present

## 2016-01-22 DIAGNOSIS — M1990 Unspecified osteoarthritis, unspecified site: Secondary | ICD-10-CM | POA: Diagnosis not present

## 2016-01-22 DIAGNOSIS — I129 Hypertensive chronic kidney disease with stage 1 through stage 4 chronic kidney disease, or unspecified chronic kidney disease: Secondary | ICD-10-CM | POA: Diagnosis not present

## 2016-01-23 ENCOUNTER — Telehealth: Payer: Self-pay | Admitting: Family Medicine

## 2016-01-23 ENCOUNTER — Other Ambulatory Visit: Payer: Self-pay | Admitting: Cardiology

## 2016-01-23 DIAGNOSIS — I129 Hypertensive chronic kidney disease with stage 1 through stage 4 chronic kidney disease, or unspecified chronic kidney disease: Secondary | ICD-10-CM | POA: Diagnosis not present

## 2016-01-23 DIAGNOSIS — R251 Tremor, unspecified: Secondary | ICD-10-CM | POA: Diagnosis not present

## 2016-01-23 DIAGNOSIS — R413 Other amnesia: Secondary | ICD-10-CM | POA: Diagnosis not present

## 2016-01-23 DIAGNOSIS — N183 Chronic kidney disease, stage 3 (moderate): Secondary | ICD-10-CM | POA: Diagnosis not present

## 2016-01-23 DIAGNOSIS — I2510 Atherosclerotic heart disease of native coronary artery without angina pectoris: Secondary | ICD-10-CM | POA: Diagnosis not present

## 2016-01-23 DIAGNOSIS — M1990 Unspecified osteoarthritis, unspecified site: Secondary | ICD-10-CM | POA: Diagnosis not present

## 2016-01-23 NOTE — Telephone Encounter (Signed)
OK to set up speech therapy.

## 2016-01-23 NOTE — Telephone Encounter (Signed)
° °  Jim from [facility] call to family believe pt right leg is getting shorter. Clair Gulling said he left messege with Dr Maureen Ralphs office who last treated her in 2014   Family report pt has hx of making medication errors jim req verbal orders for speech therapy for demetia . [place]etia seems to be the basic as to why she is making medication erros    Jim phone number [phone]

## 2016-01-24 NOTE — Telephone Encounter (Signed)
Clair Gulling is aware of orders.

## 2016-01-28 ENCOUNTER — Telehealth: Payer: Self-pay | Admitting: Family Medicine

## 2016-01-28 DIAGNOSIS — I129 Hypertensive chronic kidney disease with stage 1 through stage 4 chronic kidney disease, or unspecified chronic kidney disease: Secondary | ICD-10-CM | POA: Diagnosis not present

## 2016-01-28 DIAGNOSIS — R413 Other amnesia: Secondary | ICD-10-CM | POA: Diagnosis not present

## 2016-01-28 DIAGNOSIS — I2510 Atherosclerotic heart disease of native coronary artery without angina pectoris: Secondary | ICD-10-CM | POA: Diagnosis not present

## 2016-01-28 DIAGNOSIS — R251 Tremor, unspecified: Secondary | ICD-10-CM | POA: Diagnosis not present

## 2016-01-28 DIAGNOSIS — M1990 Unspecified osteoarthritis, unspecified site: Secondary | ICD-10-CM | POA: Diagnosis not present

## 2016-01-28 DIAGNOSIS — N183 Chronic kidney disease, stage 3 (moderate): Secondary | ICD-10-CM | POA: Diagnosis not present

## 2016-01-28 NOTE — Telephone Encounter (Signed)
Pt's bp was elevated last night,  [id number]/ 80. no distress, no other symptoms. Noted last night the med multaq was not taken. Pt was confused. Working with daughter in that.

## 2016-01-30 DIAGNOSIS — N183 Chronic kidney disease, stage 3 (moderate): Secondary | ICD-10-CM | POA: Diagnosis not present

## 2016-01-30 DIAGNOSIS — I2510 Atherosclerotic heart disease of native coronary artery without angina pectoris: Secondary | ICD-10-CM | POA: Diagnosis not present

## 2016-01-30 DIAGNOSIS — M1990 Unspecified osteoarthritis, unspecified site: Secondary | ICD-10-CM | POA: Diagnosis not present

## 2016-01-30 DIAGNOSIS — R413 Other amnesia: Secondary | ICD-10-CM | POA: Diagnosis not present

## 2016-01-30 DIAGNOSIS — R251 Tremor, unspecified: Secondary | ICD-10-CM | POA: Diagnosis not present

## 2016-01-30 DIAGNOSIS — I129 Hypertensive chronic kidney disease with stage 1 through stage 4 chronic kidney disease, or unspecified chronic kidney disease: Secondary | ICD-10-CM | POA: Diagnosis not present

## 2016-01-31 DIAGNOSIS — I129 Hypertensive chronic kidney disease with stage 1 through stage 4 chronic kidney disease, or unspecified chronic kidney disease: Secondary | ICD-10-CM | POA: Diagnosis not present

## 2016-01-31 DIAGNOSIS — R413 Other amnesia: Secondary | ICD-10-CM | POA: Diagnosis not present

## 2016-01-31 DIAGNOSIS — I2510 Atherosclerotic heart disease of native coronary artery without angina pectoris: Secondary | ICD-10-CM | POA: Diagnosis not present

## 2016-01-31 DIAGNOSIS — M1990 Unspecified osteoarthritis, unspecified site: Secondary | ICD-10-CM | POA: Diagnosis not present

## 2016-01-31 DIAGNOSIS — N183 Chronic kidney disease, stage 3 (moderate): Secondary | ICD-10-CM | POA: Diagnosis not present

## 2016-01-31 DIAGNOSIS — R251 Tremor, unspecified: Secondary | ICD-10-CM | POA: Diagnosis not present

## 2016-02-03 ENCOUNTER — Telehealth: Payer: Self-pay | Admitting: Family Medicine

## 2016-02-03 DIAGNOSIS — M1990 Unspecified osteoarthritis, unspecified site: Secondary | ICD-10-CM | POA: Diagnosis not present

## 2016-02-03 DIAGNOSIS — I129 Hypertensive chronic kidney disease with stage 1 through stage 4 chronic kidney disease, or unspecified chronic kidney disease: Secondary | ICD-10-CM | POA: Diagnosis not present

## 2016-02-03 DIAGNOSIS — R251 Tremor, unspecified: Secondary | ICD-10-CM | POA: Diagnosis not present

## 2016-02-03 DIAGNOSIS — N183 Chronic kidney disease, stage 3 (moderate): Secondary | ICD-10-CM | POA: Diagnosis not present

## 2016-02-03 DIAGNOSIS — I2510 Atherosclerotic heart disease of native coronary artery without angina pectoris: Secondary | ICD-10-CM | POA: Diagnosis not present

## 2016-02-03 DIAGNOSIS — R413 Other amnesia: Secondary | ICD-10-CM | POA: Diagnosis not present

## 2016-02-03 NOTE — Telephone Encounter (Signed)
OK 

## 2016-02-03 NOTE — Telephone Encounter (Signed)
Margaretha Sheffield is aware of verbal orders via voicemail.

## 2016-02-03 NOTE — Telephone Encounter (Signed)
[facility] is requesting additional skill nursing for 1week for 4 weeks.

## 2016-02-04 DIAGNOSIS — N183 Chronic kidney disease, stage 3 (moderate): Secondary | ICD-10-CM | POA: Diagnosis not present

## 2016-02-04 DIAGNOSIS — I129 Hypertensive chronic kidney disease with stage 1 through stage 4 chronic kidney disease, or unspecified chronic kidney disease: Secondary | ICD-10-CM | POA: Diagnosis not present

## 2016-02-04 DIAGNOSIS — R251 Tremor, unspecified: Secondary | ICD-10-CM | POA: Diagnosis not present

## 2016-02-04 DIAGNOSIS — M1990 Unspecified osteoarthritis, unspecified site: Secondary | ICD-10-CM | POA: Diagnosis not present

## 2016-02-04 DIAGNOSIS — R413 Other amnesia: Secondary | ICD-10-CM | POA: Diagnosis not present

## 2016-02-04 DIAGNOSIS — I2510 Atherosclerotic heart disease of native coronary artery without angina pectoris: Secondary | ICD-10-CM | POA: Diagnosis not present

## 2016-02-05 DIAGNOSIS — R413 Other amnesia: Secondary | ICD-10-CM | POA: Diagnosis not present

## 2016-02-05 DIAGNOSIS — N183 Chronic kidney disease, stage 3 (moderate): Secondary | ICD-10-CM | POA: Diagnosis not present

## 2016-02-05 DIAGNOSIS — R251 Tremor, unspecified: Secondary | ICD-10-CM | POA: Diagnosis not present

## 2016-02-05 DIAGNOSIS — M1990 Unspecified osteoarthritis, unspecified site: Secondary | ICD-10-CM | POA: Diagnosis not present

## 2016-02-05 DIAGNOSIS — I129 Hypertensive chronic kidney disease with stage 1 through stage 4 chronic kidney disease, or unspecified chronic kidney disease: Secondary | ICD-10-CM | POA: Diagnosis not present

## 2016-02-05 DIAGNOSIS — I2510 Atherosclerotic heart disease of native coronary artery without angina pectoris: Secondary | ICD-10-CM | POA: Diagnosis not present

## 2016-02-06 DIAGNOSIS — M1990 Unspecified osteoarthritis, unspecified site: Secondary | ICD-10-CM | POA: Diagnosis not present

## 2016-02-06 DIAGNOSIS — I129 Hypertensive chronic kidney disease with stage 1 through stage 4 chronic kidney disease, or unspecified chronic kidney disease: Secondary | ICD-10-CM | POA: Diagnosis not present

## 2016-02-06 DIAGNOSIS — I2510 Atherosclerotic heart disease of native coronary artery without angina pectoris: Secondary | ICD-10-CM | POA: Diagnosis not present

## 2016-02-06 DIAGNOSIS — R413 Other amnesia: Secondary | ICD-10-CM | POA: Diagnosis not present

## 2016-02-06 DIAGNOSIS — N183 Chronic kidney disease, stage 3 (moderate): Secondary | ICD-10-CM | POA: Diagnosis not present

## 2016-02-06 DIAGNOSIS — R251 Tremor, unspecified: Secondary | ICD-10-CM | POA: Diagnosis not present

## 2016-02-08 DIAGNOSIS — Z23 Encounter for immunization: Secondary | ICD-10-CM | POA: Diagnosis not present

## 2016-02-10 DIAGNOSIS — R251 Tremor, unspecified: Secondary | ICD-10-CM | POA: Diagnosis not present

## 2016-02-10 DIAGNOSIS — N183 Chronic kidney disease, stage 3 (moderate): Secondary | ICD-10-CM | POA: Diagnosis not present

## 2016-02-10 DIAGNOSIS — M1990 Unspecified osteoarthritis, unspecified site: Secondary | ICD-10-CM | POA: Diagnosis not present

## 2016-02-10 DIAGNOSIS — I2510 Atherosclerotic heart disease of native coronary artery without angina pectoris: Secondary | ICD-10-CM | POA: Diagnosis not present

## 2016-02-10 DIAGNOSIS — R413 Other amnesia: Secondary | ICD-10-CM | POA: Diagnosis not present

## 2016-02-10 DIAGNOSIS — I129 Hypertensive chronic kidney disease with stage 1 through stage 4 chronic kidney disease, or unspecified chronic kidney disease: Secondary | ICD-10-CM | POA: Diagnosis not present

## 2016-02-11 ENCOUNTER — Telehealth: Payer: Self-pay | Admitting: Family Medicine

## 2016-02-11 NOTE — Telephone Encounter (Signed)
Dominique Perez is aware of verbal orders.

## 2016-02-11 NOTE — Telephone Encounter (Signed)
Margaretha Sheffield, RN with [facility] stated that the pt received her flu vaccine on Saturday and around the site it was red and swollen with itching and so had diarrhea.  Want to continue skill nursing 1 x a week for 4 weeks.

## 2016-02-11 NOTE — Telephone Encounter (Signed)
OK 

## 2016-02-12 ENCOUNTER — Telehealth: Payer: Self-pay | Admitting: Family Medicine

## 2016-02-12 DIAGNOSIS — R413 Other amnesia: Secondary | ICD-10-CM | POA: Diagnosis not present

## 2016-02-12 DIAGNOSIS — I2510 Atherosclerotic heart disease of native coronary artery without angina pectoris: Secondary | ICD-10-CM | POA: Diagnosis not present

## 2016-02-12 DIAGNOSIS — N183 Chronic kidney disease, stage 3 (moderate): Secondary | ICD-10-CM | POA: Diagnosis not present

## 2016-02-12 DIAGNOSIS — I129 Hypertensive chronic kidney disease with stage 1 through stage 4 chronic kidney disease, or unspecified chronic kidney disease: Secondary | ICD-10-CM | POA: Diagnosis not present

## 2016-02-12 DIAGNOSIS — R251 Tremor, unspecified: Secondary | ICD-10-CM | POA: Diagnosis not present

## 2016-02-12 DIAGNOSIS — M1990 Unspecified osteoarthritis, unspecified site: Secondary | ICD-10-CM | POA: Diagnosis not present

## 2016-02-12 NOTE — Telephone Encounter (Signed)
Okay 

## 2016-02-12 NOTE — Telephone Encounter (Signed)
PT would like to have verbal orders for visits 1 x a week for 2 weeks to instruct daughter.  FYI pt will be going to [facility] Dr. Maureen Ralphs Mid Nov shorting of R femur

## 2016-02-12 NOTE — Telephone Encounter (Signed)
OK 

## 2016-02-12 NOTE — Telephone Encounter (Signed)
Do you have the phone number to call back?

## 2016-02-13 ENCOUNTER — Ambulatory Visit: Payer: Medicare Other | Admitting: Family Medicine

## 2016-02-13 DIAGNOSIS — M1990 Unspecified osteoarthritis, unspecified site: Secondary | ICD-10-CM | POA: Diagnosis not present

## 2016-02-13 DIAGNOSIS — R413 Other amnesia: Secondary | ICD-10-CM | POA: Diagnosis not present

## 2016-02-13 DIAGNOSIS — I129 Hypertensive chronic kidney disease with stage 1 through stage 4 chronic kidney disease, or unspecified chronic kidney disease: Secondary | ICD-10-CM | POA: Diagnosis not present

## 2016-02-13 DIAGNOSIS — N183 Chronic kidney disease, stage 3 (moderate): Secondary | ICD-10-CM | POA: Diagnosis not present

## 2016-02-13 DIAGNOSIS — R251 Tremor, unspecified: Secondary | ICD-10-CM | POA: Diagnosis not present

## 2016-02-13 DIAGNOSIS — I2510 Atherosclerotic heart disease of native coronary artery without angina pectoris: Secondary | ICD-10-CM | POA: Diagnosis not present

## 2016-02-13 NOTE — Telephone Encounter (Signed)
Dominique Perez is aware of PT order approval. Amount of time spend was 2 minutes on the telephone.

## 2016-02-14 DIAGNOSIS — R413 Other amnesia: Secondary | ICD-10-CM | POA: Diagnosis not present

## 2016-02-14 DIAGNOSIS — I2510 Atherosclerotic heart disease of native coronary artery without angina pectoris: Secondary | ICD-10-CM | POA: Diagnosis not present

## 2016-02-14 DIAGNOSIS — N183 Chronic kidney disease, stage 3 (moderate): Secondary | ICD-10-CM | POA: Diagnosis not present

## 2016-02-14 DIAGNOSIS — R251 Tremor, unspecified: Secondary | ICD-10-CM | POA: Diagnosis not present

## 2016-02-14 DIAGNOSIS — I129 Hypertensive chronic kidney disease with stage 1 through stage 4 chronic kidney disease, or unspecified chronic kidney disease: Secondary | ICD-10-CM | POA: Diagnosis not present

## 2016-02-14 DIAGNOSIS — M1990 Unspecified osteoarthritis, unspecified site: Secondary | ICD-10-CM | POA: Diagnosis not present

## 2016-02-20 ENCOUNTER — Other Ambulatory Visit: Payer: Self-pay | Admitting: *Deleted

## 2016-02-20 DIAGNOSIS — I2510 Atherosclerotic heart disease of native coronary artery without angina pectoris: Secondary | ICD-10-CM | POA: Diagnosis not present

## 2016-02-20 DIAGNOSIS — N183 Chronic kidney disease, stage 3 (moderate): Secondary | ICD-10-CM | POA: Diagnosis not present

## 2016-02-20 DIAGNOSIS — M1990 Unspecified osteoarthritis, unspecified site: Secondary | ICD-10-CM | POA: Diagnosis not present

## 2016-02-20 DIAGNOSIS — I129 Hypertensive chronic kidney disease with stage 1 through stage 4 chronic kidney disease, or unspecified chronic kidney disease: Secondary | ICD-10-CM | POA: Diagnosis not present

## 2016-02-20 DIAGNOSIS — R251 Tremor, unspecified: Secondary | ICD-10-CM | POA: Diagnosis not present

## 2016-02-20 DIAGNOSIS — R413 Other amnesia: Secondary | ICD-10-CM | POA: Diagnosis not present

## 2016-02-21 DIAGNOSIS — I2510 Atherosclerotic heart disease of native coronary artery without angina pectoris: Secondary | ICD-10-CM | POA: Diagnosis not present

## 2016-02-21 DIAGNOSIS — N183 Chronic kidney disease, stage 3 (moderate): Secondary | ICD-10-CM | POA: Diagnosis not present

## 2016-02-21 DIAGNOSIS — R413 Other amnesia: Secondary | ICD-10-CM | POA: Diagnosis not present

## 2016-02-21 DIAGNOSIS — M1990 Unspecified osteoarthritis, unspecified site: Secondary | ICD-10-CM | POA: Diagnosis not present

## 2016-02-21 DIAGNOSIS — R251 Tremor, unspecified: Secondary | ICD-10-CM | POA: Diagnosis not present

## 2016-02-21 DIAGNOSIS — I129 Hypertensive chronic kidney disease with stage 1 through stage 4 chronic kidney disease, or unspecified chronic kidney disease: Secondary | ICD-10-CM | POA: Diagnosis not present

## 2016-02-21 MED ORDER — RIVAROXABAN 15 MG PO TABS: ORAL_TABLET | ORAL | 1 refills | Status: DC

## 2016-02-24 DIAGNOSIS — R251 Tremor, unspecified: Secondary | ICD-10-CM | POA: Diagnosis not present

## 2016-02-24 DIAGNOSIS — I129 Hypertensive chronic kidney disease with stage 1 through stage 4 chronic kidney disease, or unspecified chronic kidney disease: Secondary | ICD-10-CM | POA: Diagnosis not present

## 2016-02-24 DIAGNOSIS — R413 Other amnesia: Secondary | ICD-10-CM | POA: Diagnosis not present

## 2016-02-24 DIAGNOSIS — I2510 Atherosclerotic heart disease of native coronary artery without angina pectoris: Secondary | ICD-10-CM | POA: Diagnosis not present

## 2016-02-24 DIAGNOSIS — N183 Chronic kidney disease, stage 3 (moderate): Secondary | ICD-10-CM | POA: Diagnosis not present

## 2016-02-24 DIAGNOSIS — M1990 Unspecified osteoarthritis, unspecified site: Secondary | ICD-10-CM | POA: Diagnosis not present

## 2016-02-26 DIAGNOSIS — M1990 Unspecified osteoarthritis, unspecified site: Secondary | ICD-10-CM | POA: Diagnosis not present

## 2016-02-26 DIAGNOSIS — R251 Tremor, unspecified: Secondary | ICD-10-CM | POA: Diagnosis not present

## 2016-02-26 DIAGNOSIS — N183 Chronic kidney disease, stage 3 (moderate): Secondary | ICD-10-CM | POA: Diagnosis not present

## 2016-02-26 DIAGNOSIS — R413 Other amnesia: Secondary | ICD-10-CM | POA: Diagnosis not present

## 2016-02-26 DIAGNOSIS — I2510 Atherosclerotic heart disease of native coronary artery without angina pectoris: Secondary | ICD-10-CM | POA: Diagnosis not present

## 2016-02-26 DIAGNOSIS — I129 Hypertensive chronic kidney disease with stage 1 through stage 4 chronic kidney disease, or unspecified chronic kidney disease: Secondary | ICD-10-CM | POA: Diagnosis not present

## 2016-02-27 DIAGNOSIS — R251 Tremor, unspecified: Secondary | ICD-10-CM | POA: Diagnosis not present

## 2016-02-27 DIAGNOSIS — R413 Other amnesia: Secondary | ICD-10-CM | POA: Diagnosis not present

## 2016-02-27 DIAGNOSIS — M1990 Unspecified osteoarthritis, unspecified site: Secondary | ICD-10-CM | POA: Diagnosis not present

## 2016-02-27 DIAGNOSIS — I2510 Atherosclerotic heart disease of native coronary artery without angina pectoris: Secondary | ICD-10-CM | POA: Diagnosis not present

## 2016-02-27 DIAGNOSIS — N183 Chronic kidney disease, stage 3 (moderate): Secondary | ICD-10-CM | POA: Diagnosis not present

## 2016-02-27 DIAGNOSIS — I129 Hypertensive chronic kidney disease with stage 1 through stage 4 chronic kidney disease, or unspecified chronic kidney disease: Secondary | ICD-10-CM | POA: Diagnosis not present

## 2016-03-02 DIAGNOSIS — R413 Other amnesia: Secondary | ICD-10-CM | POA: Diagnosis not present

## 2016-03-02 DIAGNOSIS — N183 Chronic kidney disease, stage 3 (moderate): Secondary | ICD-10-CM | POA: Diagnosis not present

## 2016-03-02 DIAGNOSIS — R251 Tremor, unspecified: Secondary | ICD-10-CM | POA: Diagnosis not present

## 2016-03-02 DIAGNOSIS — M1990 Unspecified osteoarthritis, unspecified site: Secondary | ICD-10-CM | POA: Diagnosis not present

## 2016-03-02 DIAGNOSIS — I2510 Atherosclerotic heart disease of native coronary artery without angina pectoris: Secondary | ICD-10-CM | POA: Diagnosis not present

## 2016-03-02 DIAGNOSIS — I129 Hypertensive chronic kidney disease with stage 1 through stage 4 chronic kidney disease, or unspecified chronic kidney disease: Secondary | ICD-10-CM | POA: Diagnosis not present

## 2016-03-09 ENCOUNTER — Telehealth: Payer: Self-pay | Admitting: Family Medicine

## 2016-03-09 DIAGNOSIS — R413 Other amnesia: Secondary | ICD-10-CM | POA: Diagnosis not present

## 2016-03-09 DIAGNOSIS — I129 Hypertensive chronic kidney disease with stage 1 through stage 4 chronic kidney disease, or unspecified chronic kidney disease: Secondary | ICD-10-CM | POA: Diagnosis not present

## 2016-03-09 DIAGNOSIS — I2510 Atherosclerotic heart disease of native coronary artery without angina pectoris: Secondary | ICD-10-CM | POA: Diagnosis not present

## 2016-03-09 DIAGNOSIS — R251 Tremor, unspecified: Secondary | ICD-10-CM | POA: Diagnosis not present

## 2016-03-09 DIAGNOSIS — N183 Chronic kidney disease, stage 3 (moderate): Secondary | ICD-10-CM | POA: Diagnosis not present

## 2016-03-09 DIAGNOSIS — M1990 Unspecified osteoarthritis, unspecified site: Secondary | ICD-10-CM | POA: Diagnosis not present

## 2016-03-09 NOTE — Telephone Encounter (Signed)
° °  Margaretha Sheffield from [facility] to have completed  skill nursing orders and they are discharging pt  [phone]

## 2016-03-11 NOTE — Telephone Encounter (Signed)
Left message Margaretha Sheffield to call back.

## 2016-03-18 NOTE — Telephone Encounter (Signed)
Received faxed order.

## 2016-03-23 ENCOUNTER — Encounter: Payer: Self-pay | Admitting: Family Medicine

## 2016-03-23 ENCOUNTER — Ambulatory Visit (INDEPENDENT_AMBULATORY_CARE_PROVIDER_SITE_OTHER): Payer: Medicare Other | Admitting: Family Medicine

## 2016-03-23 VITALS — BP 132/72 | HR 79 | Temp 98.2°F | Ht 66.0 in | Wt 165.0 lb

## 2016-03-23 DIAGNOSIS — M159 Polyosteoarthritis, unspecified: Secondary | ICD-10-CM | POA: Diagnosis not present

## 2016-03-23 DIAGNOSIS — I10 Essential (primary) hypertension: Secondary | ICD-10-CM | POA: Diagnosis not present

## 2016-03-23 DIAGNOSIS — I483 Typical atrial flutter: Secondary | ICD-10-CM | POA: Diagnosis not present

## 2016-03-23 DIAGNOSIS — Z9861 Coronary angioplasty status: Secondary | ICD-10-CM

## 2016-03-23 DIAGNOSIS — E785 Hyperlipidemia, unspecified: Secondary | ICD-10-CM

## 2016-03-23 DIAGNOSIS — R413 Other amnesia: Secondary | ICD-10-CM | POA: Insufficient documentation

## 2016-03-23 DIAGNOSIS — M21612 Bunion of left foot: Secondary | ICD-10-CM

## 2016-03-23 DIAGNOSIS — I2510 Atherosclerotic heart disease of native coronary artery without angina pectoris: Secondary | ICD-10-CM | POA: Diagnosis not present

## 2016-03-23 NOTE — Progress Notes (Signed)
Subjective:     Patient ID: Dominique Perez, female   DOB: [birth year], 82 y.o.   MRN: [id number]  HPI Patient seen today coming by daughter. She has very supportive family. Her chronic medical problems include history of osteoarthritis involving multiple joints, hyperlipidemia, hypertension, atrial fibrillation, chronic kidney disease. She has had some recent progressive memory loss especially short-term memory. In July MMSE 27/30. Patient and family elected against medication at that time. She has history of CAD with previous angioplasty and denies any recent chest pains. Current medications include Multaq, Imdur, Lopressor, Xarelto, simvastatin, and as needed Ultraset.  Daughter oversees her medications..  She also has gabapentin on her med list but not clear why she is still taking this. She had some mild neuropathic symptoms in the past but does not currently complain of any neuropathy symptoms. They're only giving one half of 100 mg gabapentin tablet.  Denies recent fall. She does apparently complain of bilateral foot pain frequently. She has severe bunions and nails that are in need of trimming. They're not aware of any recent foot ulcers. [facility] with RN, occupational therapy, and physical therapy. They've made some adjustments in the home to reduce fall risk  Past Medical History:  Diagnosis Date  . Arthritis   . Atrial fibrillation ([place])   . Atrial flutter ([place])   . Coronary artery disease    s/p pci 1999 rca, 2007 diagonal  . Fracture of right femur ([place])   . Gallstones   . GERD (gastroesophageal reflux disease)   . Hyperlipidemia   . Hypertension   . Leg pain, right    chronic  . Tremor    chronic   Past Surgical History:  Procedure Laterality Date  . CARDIAC CATHETERIZATION  1999    PCI to RCA  . CARDIAC CATHETERIZATION  2007   PCI to diagonal  . CARDIAC CATHETERIZATION  2002   treated medically  . CATARACT EXTRACTION    . femoral  pseudoaneurysm  1999   repair  . KNEE SURGERY     right, titanium rod in the right femur  . left knee  2009   replacement by Dr. Wynelle Link  . TOTAL HIP ARTHROPLASTY     Dr. Durward Fortes    reports that she has never smoked. She has never used smokeless tobacco. She reports that she does not drink alcohol. Her drug history is not on file. family history includes Cancer in her father; Heart attack in her mother. Allergies  Allergen Reactions  . Amiodarone Other (See Comments)    tremor  . Erythromycin Stearate Other (See Comments)    REACTION: fever  . Ferrous Sulfate Other (See Comments)    REACTION: fever  . Iohexol Other (See Comments)     Code: HIVES, Desc: Pt states that 10 yrs ago she IV contrast for a CT and broke out in hives and experienced SOB.  Done w/o iv contrast today., Onset Date: [id number]   . Sulfa Antibiotics Hives      Review of Systems  Constitutional: Negative for chills and fever.  Respiratory: Negative for shortness of breath.   Cardiovascular: Negative for chest pain.  Gastrointestinal: Negative for abdominal pain, nausea and vomiting.  Genitourinary: Negative for dysuria.  Neurological: Positive for tremors. Negative for syncope, weakness and headaches.  Psychiatric/Behavioral: Negative for agitation, confusion and dysphoric mood.       Objective:   Physical Exam  Constitutional: She appears well-developed and well-nourished.  HENT:  Mouth/Throat: Oropharynx  is clear and moist.  Neck: Neck supple.  Cardiovascular: Normal rate and regular rhythm.   Musculoskeletal: She exhibits no edema.  Patient has severe bunion left foot. No ulcerations  Neurological: She is alert. No cranial nerve deficit.       Assessment:     #1 history of memory loss. Recent TSH and B12 normal  #2 history of atrial fibrillation with rapid ventricular response. Currently appears to be in regular rhythm and rate controlled (on Lopressor) and maintained on Xarelto  #3  hypertension stable  #4 history of CAD and dyslipidemia    Plan:     -Discontinue gabapentin with no clear benefit especially at current dose -Daughter had several questions regarding other medications and was asking about discontinuing simvastatin. Will defer to cardiology -Patient's daughter requesting podiatry referral-to see if there conservative treatments that might help reduce her foot pain with ambulation and she was given name of local podiatrist -We had a long discussion regarding pros and cons of medication for memory loss such as Aricept or Namenda and explained these have limited benefit of slowing progression. Daughter and patient are not interested at this time  Eulas Post MD [facility]

## 2016-03-23 NOTE — Patient Instructions (Signed)
Would go ahead and stop the Gabapentin at this time.

## 2016-04-21 ENCOUNTER — Encounter: Payer: Self-pay | Admitting: Podiatry

## 2016-04-21 ENCOUNTER — Ambulatory Visit (INDEPENDENT_AMBULATORY_CARE_PROVIDER_SITE_OTHER): Payer: Medicare Other | Admitting: Podiatry

## 2016-04-21 VITALS — Ht 66.0 in | Wt 165.0 lb

## 2016-04-21 DIAGNOSIS — Z9861 Coronary angioplasty status: Secondary | ICD-10-CM

## 2016-04-21 DIAGNOSIS — M79672 Pain in left foot: Secondary | ICD-10-CM | POA: Diagnosis not present

## 2016-04-21 DIAGNOSIS — M79671 Pain in right foot: Secondary | ICD-10-CM | POA: Diagnosis not present

## 2016-04-21 DIAGNOSIS — I2510 Atherosclerotic heart disease of native coronary artery without angina pectoris: Secondary | ICD-10-CM | POA: Diagnosis not present

## 2016-04-21 DIAGNOSIS — B351 Tinea unguium: Secondary | ICD-10-CM | POA: Diagnosis not present

## 2016-04-21 NOTE — Progress Notes (Signed)
SUBJECTIVE: 82 y.o. year old female presents by her daughter requesting toe nails trimmed. They are thick, deformed, and painful to walk. Patient is alert and in good spirit. Has difficulty bending her right lower limb. Denies any acute distress other than nail problem at this time.    REVIEW OF SYSTEMS: Pertinent items noted in HPI and remainder of comprehensive ROS otherwise negative.  OBJECTIVE: DERMATOLOGIC EXAMINATION: Thick dystrophic nails x 10.   VASCULAR EXAMINATION OF LOWER LIMBS: Dorsalis Pedis artery: Palpable with normal pulsation bilateral. Posterior Tibial artery: Not palpable bilateral. Capillary Filling times within 3 seconds in all digits.  No edema or ischemic changes noted. Temperature gradient from tibial crest to dorsum of foot is within normal bilateral.  NEUROLOGIC EXAMINATION OF THE LOWER LIMBS: Achilles DTR is present and within normal. Monofilament (Semmes-Weinstein 10-gm) sensory testing positive 6 out of 6, bilateral. Vibratory sensations(128Hz  turning fork) intact at medial and lateral forefoot bilateral.  Sharp and Dull discriminatory sensations at the plantar ball of hallux is intact bilateral.   MUSCULOSKELETAL EXAMINATION: Severe Hallux valgus and bunion bilateral. Weak and hypermobile first metatarsal left. Severe digital contracture with lateral deviation bilateral. Flattening arch L>R with weight bearing bilateral.  ASSESSMENT: Onychomycosis x 10. Painful feet.  PLAN: Reviewed findings and available options. All nails debrided.

## 2016-04-21 NOTE — Patient Instructions (Signed)
Seen for hypertrophic nails. All nails debrided. Return in 3 months or as needed.  

## 2016-05-28 ENCOUNTER — Other Ambulatory Visit: Payer: Self-pay | Admitting: Family Medicine

## 2016-06-26 ENCOUNTER — Other Ambulatory Visit: Payer: Self-pay | Admitting: Cardiology

## 2016-06-26 NOTE — Telephone Encounter (Signed)
REFILL 

## 2016-07-01 ENCOUNTER — Ambulatory Visit (INDEPENDENT_AMBULATORY_CARE_PROVIDER_SITE_OTHER): Payer: Medicare Other | Admitting: Cardiology

## 2016-07-01 ENCOUNTER — Encounter: Payer: Self-pay | Admitting: Cardiology

## 2016-07-01 VITALS — BP 134/72 | HR 74 | Ht 66.0 in | Wt 160.0 lb

## 2016-07-01 DIAGNOSIS — I480 Paroxysmal atrial fibrillation: Secondary | ICD-10-CM | POA: Diagnosis not present

## 2016-07-01 DIAGNOSIS — Z9861 Coronary angioplasty status: Secondary | ICD-10-CM | POA: Diagnosis not present

## 2016-07-01 DIAGNOSIS — I10 Essential (primary) hypertension: Secondary | ICD-10-CM

## 2016-07-01 DIAGNOSIS — Z7901 Long term (current) use of anticoagulants: Secondary | ICD-10-CM

## 2016-07-01 DIAGNOSIS — I2510 Atherosclerotic heart disease of native coronary artery without angina pectoris: Secondary | ICD-10-CM

## 2016-07-01 DIAGNOSIS — N183 Chronic kidney disease, stage 3 unspecified: Secondary | ICD-10-CM

## 2016-07-01 LAB — LIPID PANEL
Cholesterol: 162 mg/dL (ref <200)
HDL: 88 mg/dL (ref >50)
LDL CALC: 59 mg/dL (ref <100)
Total CHOL/HDL Ratio: 1.8 Ratio (ref <5.0)
Triglycerides: 77 mg/dL (ref <150)
VLDL: 15 mg/dL (ref <30)

## 2016-07-01 LAB — COMPREHENSIVE METABOLIC PANEL
ALBUMIN: 3.7 g/dL (ref 3.6–5.1)
ALT: 10 U/L (ref 6–29)
AST: 15 U/L (ref 10–35)
Alkaline Phosphatase: 81 U/L (ref 33–130)
BILIRUBIN TOTAL: 0.4 mg/dL (ref 0.2–1.2)
BUN: 11 mg/dL (ref 7–25)
CO2: 23 mmol/L (ref 20–31)
CREATININE: 1.11 mg/dL — AB (ref 0.60–0.88)
Calcium: 8.7 mg/dL (ref 8.6–10.4)
Chloride: 105 mmol/L (ref 98–110)
GLUCOSE: 94 mg/dL (ref 65–99)
Potassium: 4.4 mmol/L (ref 3.5–5.3)
SODIUM: 140 mmol/L (ref 135–146)
Total Protein: 7.1 g/dL (ref 6.1–8.1)

## 2016-07-01 NOTE — Progress Notes (Signed)
Dominique Perez Date of Birth: [birth year] Medical Record [id number]  History of Present Illness: Dominique Perez is seen for followup Afib and CAD. She has a history of atrial fibrillation that is managed with Multaq and Xarelto.She has a history of significant coronary disease. She is status post stenting of the right coronary in 1999. She had stenting of the diagonal 2007 with a drug-eluting stent. She was seen in ED in August 2017 with Afib and RVR. Converted in ED. Seen in consultation by cardiology. No changes made. She had a recurrent spell in November and EMS was called but she was in [facility] by the time she got there.   She reports she is doing well today. She is seen with her daughter. No chest pain, dyspnea, palpitations. Sedentary due to chronic leg fracture.   Current Outpatient Prescriptions on File Prior to Visit  Medication Sig Dispense Refill  . dronedarone (MULTAQ) 400 MG tablet TAKE 1 BY MOUTH TWICE DAILY WITH A MEAL 180 tablet 1  . isosorbide mononitrate (IMDUR) 60 MG 24 hr tablet Take 1 tablet (60 mg total) by mouth daily. 90 tablet 1  . lansoprazole (PREVACID) 30 MG capsule Take 1 capsule (30 mg total) by mouth daily at 12 noon. NEED OV. (Patient taking differently: Take 30 mg by mouth every evening. NEED OV.) 90 capsule 2  . metoprolol tartrate (LOPRESSOR) 25 MG tablet TAKE 1 TABLET TWICE A DAY 180 tablet 0  . Rivaroxaban (XARELTO) 15 MG TABS tablet Take 1 tablet by mouth every morning with breakfast 90 tablet 1  . simvastatin (ZOCOR) 20 MG tablet Take 0.5 tablets (10 mg total) by mouth daily at 6 PM. 45 tablet 1  . traMADol-acetaminophen (ULTRACET) 37.5-325 MG tablet TAKE 1 TABLET BY MOUTH EVERY 6 HOURS 120 tablet 2   No current facility-administered medications on file prior to visit.     Allergies  Allergen Reactions  . Amiodarone Other (See Comments)    tremor  . Erythromycin Stearate Other (See Comments)    REACTION: fever  . Ferrous Sulfate Other (See Comments)   REACTION: fever  . Iohexol Other (See Comments)     Code: HIVES, Desc: Pt states that 10 yrs ago she IV contrast for a CT and broke out in hives and experienced SOB.  Done w/o iv contrast today., Onset Date: [id number]   . Sulfa Antibiotics Hives    Past Medical History:  Diagnosis Date  . Arthritis   . Atrial fibrillation ([place])   . Atrial flutter ([place])   . Coronary artery disease    s/p pci 1999 rca, 2007 diagonal  . Fracture of right femur ([place])   . Gallstones   . GERD (gastroesophageal reflux disease)   . Hyperlipidemia   . Hypertension   . Leg pain, right    chronic  . Tremor    chronic    Past Surgical History:  Procedure Laterality Date  . CARDIAC CATHETERIZATION  1999    PCI to RCA  . CARDIAC CATHETERIZATION  2007   PCI to diagonal  . CARDIAC CATHETERIZATION  2002   treated medically  . CATARACT EXTRACTION    . femoral pseudoaneurysm  1999   repair  . KNEE SURGERY     right, titanium rod in the right femur  . left knee  2009   replacement by Dr. Wynelle Link  . TOTAL HIP ARTHROPLASTY     Dr. Durward Fortes    History  Smoking Status  . Never Smoker  Smokeless Tobacco  .  Never Used    History  Alcohol Use No    Family History  Problem Relation Age of Onset  . Heart attack Mother   . Cancer Father     Review of Systems: As noted in HPI. All other systems were reviewed and are negative.  Physical Exam: BP 134/72   Pulse 74   Ht 5\' 6"  (1.676 m)   Wt 160 lb (72.6 kg)   BMI 25.82 kg/m  She is an elderly white female in no acute distress. She is seen in a wheelchair.Marland Kitchen HEENT is normal.  Neck is without JVD or bruits. Lungs are clear. CV exam shows a regular rate and rhythm without gallop or murmur. Abdomen is soft and nontender without masses or edema. She has no focal neurologic abnormalities.  LABORATORY DATA:  Lab Results  Component Value Date   WBC 7.0 12/17/2015   HGB 12.6 12/17/2015   HCT 37.0 12/17/2015   PLT 192 12/17/2015   GLUCOSE 103 (H)  12/17/2015   CHOL 161 04/26/2015   TRIG 125.0 04/26/2015   HDL 72.80 04/26/2015   LDLCALC 63 04/26/2015   ALT 10 04/26/2015   AST 18 04/26/2015   NA 142 12/17/2015   K 3.9 12/17/2015   CL 106 12/17/2015   CREATININE 1.00 12/17/2015   BUN 13 12/17/2015   CO2 21 (L) 12/17/2015   TSH 0.835 12/17/2015   INR 1.88 (H) 01/22/2015   Echo: 02/25/11: Study Conclusions  - Left ventricle: The cavity size was normal. Wall thickness was increased in a pattern of mild LVH. Systolic function was normal. The estimated ejection fraction was in the range of 55% to 60%. - Mitral valve: Nodular calcification of anterior leaflet tip Mild regurgitation. - Left atrium: The atrium was mildly dilated. - Atrial septum: No defect or patent foramen ovale was identified. - Tricuspid valve: Mild-moderate regurgitation. - Pulmonary arteries: PA peak pressure: 57mm Hg (S). - Pericardium, extracardiac: A trivial pericardial effusion was identified.  Assessment / Plan: 1. Coronary disease status post stenting of the right coronary in 1999. Status post stenting of the diagonal and 2007. She is asymptomatic. Continue isosorbide, metoprolol, and statin therapy.   2. Atrial fibrillation-now with intermittent breakthrough on Multaq. Stressed importance of taking multaq with meals. Will continue therapy for now with prn additional  metoprolol for breakthrough.  Continue chronic anticoagulation. If she has more frequent breakthrough options are limited. She is intolerant of amiodarone. Cannot take Flecainide due to CAD.  Will observe for now.   3. Hypertension, well controlled.  4. Hypercholesterolemia-on simvastatin. Will check fasting labs today.  Follow up in 6 months.

## 2016-07-01 NOTE — Patient Instructions (Addendum)
Continue your current therapy  We will check blood work today  I will see you in 6-8 months

## 2016-07-02 ENCOUNTER — Other Ambulatory Visit: Payer: Self-pay

## 2016-07-02 MED ORDER — SIMVASTATIN 20 MG PO TABS
10.0000 mg | ORAL_TABLET | Freq: Every day | ORAL | 3 refills | Status: AC
Start: 2016-07-02 — End: ?

## 2016-07-02 MED ORDER — METOPROLOL TARTRATE 25 MG PO TABS: 25.0000 mg | ORAL_TABLET | Freq: Two times a day (BID) | ORAL | 3 refills | Status: AC

## 2016-07-02 MED ORDER — RIVAROXABAN 15 MG PO TABS: ORAL_TABLET | ORAL | 3 refills | Status: DC

## 2016-07-02 MED ORDER — DRONEDARONE HCL 400 MG PO TABS: ORAL_TABLET | ORAL | 3 refills | Status: AC

## 2016-07-02 MED ORDER — LANSOPRAZOLE 30 MG PO CPDR: 30.0000 mg | DELAYED_RELEASE_CAPSULE | Freq: Every day | ORAL | 3 refills | Status: AC

## 2016-07-08 ENCOUNTER — Ambulatory Visit: Payer: Medicare Other | Admitting: Cardiology

## 2016-07-14 ENCOUNTER — Other Ambulatory Visit: Payer: Self-pay | Admitting: Cardiology

## 2016-07-17 ENCOUNTER — Ambulatory Visit: Payer: Medicare Other | Admitting: Cardiology

## 2016-07-20 ENCOUNTER — Ambulatory Visit: Payer: Medicare Other | Admitting: Podiatry

## 2016-09-21 ENCOUNTER — Other Ambulatory Visit: Payer: Self-pay | Admitting: Cardiology

## 2016-10-09 DIAGNOSIS — C55 Malignant neoplasm of uterus, part unspecified: Secondary | ICD-10-CM

## 2016-10-09 HISTORY — DX: Malignant neoplasm of uterus, part unspecified: C55

## 2016-11-05 ENCOUNTER — Telehealth: Payer: Self-pay | Admitting: Family Medicine

## 2016-11-05 DIAGNOSIS — Z0289 Encounter for other administrative examinations: Secondary | ICD-10-CM

## 2016-11-05 NOTE — Telephone Encounter (Signed)
Dominique Perez the daughter of Abbie Prescher dropped of a disability parking placard form   Call Dominique Perez to pick up at [phone]  Disposition: Dr's [employer]

## 2016-11-05 NOTE — Telephone Encounter (Signed)
Misty is aware the form was left to be completed.

## 2016-11-06 NOTE — Telephone Encounter (Signed)
Form filled out.  Placed in Dr. Sharmaine Base folder for distribution.

## 2016-11-09 NOTE — Telephone Encounter (Signed)
Ready for pick up.  Dominique Perez notified.  She stated she will have her daughter pick up.

## 2016-11-20 ENCOUNTER — Encounter: Payer: Self-pay | Admitting: Family Medicine

## 2016-11-20 ENCOUNTER — Ambulatory Visit (INDEPENDENT_AMBULATORY_CARE_PROVIDER_SITE_OTHER): Payer: Medicare Other | Admitting: Family Medicine

## 2016-11-20 VITALS — BP 110/78 | HR 70 | Temp 98.4°F | Wt 146.9 lb

## 2016-11-20 DIAGNOSIS — N939 Abnormal uterine and vaginal bleeding, unspecified: Secondary | ICD-10-CM

## 2016-11-20 DIAGNOSIS — I2510 Atherosclerotic heart disease of native coronary artery without angina pectoris: Secondary | ICD-10-CM | POA: Diagnosis not present

## 2016-11-20 DIAGNOSIS — Z9861 Coronary angioplasty status: Secondary | ICD-10-CM | POA: Diagnosis not present

## 2016-11-20 DIAGNOSIS — R300 Dysuria: Secondary | ICD-10-CM

## 2016-11-20 LAB — POCT URINALYSIS DIPSTICK
BILIRUBIN UA: NEGATIVE
Glucose, UA: NEGATIVE
KETONES UA: NEGATIVE
LEUKOCYTES UA: NEGATIVE
Nitrite, UA: NEGATIVE
PH UA: 6.5 (ref 5.0–8.0)
PROTEIN UA: NEGATIVE
SPEC GRAV UA: 1.015 (ref 1.010–1.025)
Urobilinogen, UA: 0.2 E.U./dL

## 2016-11-20 LAB — CBC WITH DIFFERENTIAL/PLATELET
BASOS ABS: 0.1 10*3/uL (ref 0.0–0.1)
Basophils Relative: 1.1 % (ref 0.0–3.0)
Eosinophils Absolute: 0.1 10*3/uL (ref 0.0–0.7)
Eosinophils Relative: 0.8 % (ref 0.0–5.0)
HCT: 38.4 % (ref 36.0–46.0)
Hemoglobin: 12.5 g/dL (ref 12.0–15.0)
LYMPHS ABS: 1.9 10*3/uL (ref 0.7–4.0)
Lymphocytes Relative: 26.4 % (ref 12.0–46.0)
MCHC: 32.5 g/dL (ref 30.0–36.0)
MCV: 83.3 fl (ref 78.0–100.0)
MONO ABS: 0.6 10*3/uL (ref 0.1–1.0)
MONOS PCT: 7.7 % (ref 3.0–12.0)
NEUTROS ABS: 4.6 10*3/uL (ref 1.4–7.7)
NEUTROS PCT: 64 % (ref 43.0–77.0)
PLATELETS: 221 10*3/uL (ref 150.0–400.0)
RBC: 4.62 Mil/uL (ref 3.87–5.11)
RDW: 17.3 % — ABNORMAL HIGH (ref 11.5–15.5)
WBC: 7.3 10*3/uL (ref 4.0–10.5)

## 2016-11-20 NOTE — Patient Instructions (Signed)
Postmenopausal Bleeding Postmenopausal bleeding is any bleeding a woman has after she has entered into menopause. Menopause is the end of a woman's fertile years. After menopause, a woman no longer ovulates or has menstrual periods. Postmenopausal bleeding can be caused by various things. Any type of postmenopausal bleeding, even if it appears to be a typical menstrual period, is concerning. This should be evaluated by your health care provider. Any treatment will depend on the cause of the bleeding. Follow these instructions at home: Monitor your condition for any changes. The following actions may help to alleviate any discomfort you are experiencing:  Avoid the use of tampons and douches as directed by your health care provider.  Change your pads frequently.  Get regular pelvic exams and Pap tests.  Keep all follow-up appointments for diagnostic tests as directed by your health care provider.  Contact a health care provider if:  Your bleeding lasts more than 1 week.  You have abdominal pain.  You have bleeding with sexual intercourse. Get help right away if:  You have a fever, chills, headache, dizziness, muscle aches, and bleeding.  You have severe pain with bleeding.  You are passing blood clots.  You have bleeding and need more than 1 pad an hour.  You feel faint. This information is not intended to replace advice given to you by your health care provider. Make sure you discuss any questions you have with your health care provider. Document Released: 08/05/2005 Document Revised: 10/03/2015 Document Reviewed: 11/24/2012 Elsevier Interactive Patient Education  [employer].  We will set up pelvic ultrasound and be in touch.

## 2016-11-20 NOTE — Progress Notes (Signed)
Subjective:     Patient ID: Dominique Perez, female   DOB: 1926-11-30, 83 y.o.   MRN: [id number]  HPI   Patient has multiple chronic problems including history of CAD, atrial fibrillation, hypertension, GERD, osteoarthritis, chronic kidney disease. She is maintained on chronic anticoagulation. She is seen today, and by her daughter with the noting some blood in her underwear and bed sheets just yesterday. She has not noted any blood in her stools. One-day history of some mild burning with urination. No fever. She thinks bleeding is coming from the vagina area. Denies any other bleeding complications. Denies pelvic pain. No abdominal pain. Bowel movements have been normal.  Past Medical History:  Diagnosis Date  . Arthritis   . Atrial fibrillation ([place])   . Atrial flutter ([place])   . Coronary artery disease    s/p pci 1999 rca, 2007 diagonal  . Fracture of right femur ([place])   . Gallstones   . GERD (gastroesophageal reflux disease)   . Hyperlipidemia   . Hypertension   . Leg pain, right    chronic  . Tremor    chronic   Past Surgical History:  Procedure Laterality Date  . CARDIAC CATHETERIZATION  1999    PCI to RCA  . CARDIAC CATHETERIZATION  2007   PCI to diagonal  . CARDIAC CATHETERIZATION  2002   treated medically  . CATARACT EXTRACTION    . femoral pseudoaneurysm  1999   repair  . KNEE SURGERY     right, titanium rod in the right femur  . left knee  2009   replacement by Dr. Wynelle Link  . TOTAL HIP ARTHROPLASTY     Dr. Durward Fortes    reports that she has never smoked. She has never used smokeless tobacco. She reports that she does not drink alcohol. Her drug history is not on file. family history includes Cancer in her father; Heart attack in her mother. Allergies  Allergen Reactions  . Amiodarone Other (See Comments)    tremor  . Erythromycin Stearate Other (See Comments)    REACTION: fever  . Ferrous Sulfate Other (See Comments)    REACTION: fever  . Iohexol Other (See  Comments)     Code: HIVES, Desc: Pt states that 10 yrs ago she IV contrast for a CT and broke out in hives and experienced SOB.  Done w/o iv contrast today., Onset Date: [id number]   . Sulfa Antibiotics Hives     Review of Systems  Constitutional: Negative for chills and fever.  Respiratory: Negative for shortness of breath.   Cardiovascular: Negative for chest pain.  Gastrointestinal: Negative for abdominal pain, blood in stool, constipation, diarrhea, nausea, rectal pain and vomiting.  Genitourinary: Positive for vaginal bleeding. Negative for dysuria, genital sores, pelvic pain, vaginal discharge and vaginal pain.  Musculoskeletal: Negative for back pain.  Hematological: Negative for adenopathy.       Objective:   Physical Exam  Constitutional: She appears well-developed and well-nourished.  Cardiovascular: Normal rate and regular rhythm.   Pulmonary/Chest: Effort normal and breath sounds normal. No respiratory distress. She has no wheezes. She has no rales.  Abdominal: Soft. There is no tenderness.  Genitourinary:  Genitourinary Comments: Atrophic vaginal mucosa.  Pt could not get into stirrups for exam.  No active bleeding but did see some dark blood at introitus.  Bimanual- nontender.  No per-anal bleeding noted.  Musculoskeletal: She exhibits no edema.       Assessment:     83 year old on  chronic anti-coagulation (Xarelto) who presents with vaginal bleeding. She has had complicated past right knee fracture and unable to get in stirrups.  Exam difficult.  Always worrisome for possible uterine mass at her age.    Plan:     -Check CBC -get pelvic ultrasound to further assess. -may have to look at holding Xarelto if bleeding gets heavier -will likely need gyn follow up.  Eulas Post MD [facility] at [facility]

## 2016-11-23 ENCOUNTER — Ambulatory Visit
Admission: RE | Admit: 2016-11-23 | Discharge: 2016-11-23 | Disposition: A | Discharge status: Home / Self Care | Payer: Medicare Other | Source: Ambulatory Visit | Attending: Family Medicine | Admitting: Family Medicine

## 2016-11-23 DIAGNOSIS — N939 Abnormal uterine and vaginal bleeding, unspecified: Secondary | ICD-10-CM

## 2016-11-24 ENCOUNTER — Encounter: Payer: Self-pay | Admitting: Family Medicine

## 2016-12-02 ENCOUNTER — Encounter: Payer: Self-pay | Admitting: Obstetrics & Gynecology

## 2016-12-02 ENCOUNTER — Ambulatory Visit (INDEPENDENT_AMBULATORY_CARE_PROVIDER_SITE_OTHER): Payer: Medicare Other | Admitting: Obstetrics & Gynecology

## 2016-12-02 VITALS — BP 120/78

## 2016-12-02 DIAGNOSIS — R938 Abnormal findings on diagnostic imaging of other specified body structures: Secondary | ICD-10-CM

## 2016-12-02 DIAGNOSIS — R9389 Abnormal findings on diagnostic imaging of other specified body structures: Secondary | ICD-10-CM

## 2016-12-02 DIAGNOSIS — N950 Postmenopausal bleeding: Secondary | ICD-10-CM

## 2016-12-02 NOTE — Progress Notes (Signed)
    Dominique Perez [birth year] [id number]        83 y.o.  Widowed.  Accompanied by daughter  RP:  PMB with thickened Endometrial line by [place]  HPI:  Seen by Dr Elease Hashimoto on 7/13th.  Per Dr Erick Blinks note:  Patient has multiple chronic problems including history of CAD, atrial fibrillation, hypertension, GERD, osteoarthritis, chronic kidney disease. She is maintained on chronic anticoagulation.   Patient presented because she noted some blood in her underwear and bed sheets 7/12th. She has not noted any blood in her stools.  No fever. She thinks bleeding is coming from the vagina area. Denies any other bleeding complications. Denies pelvic pain. No abdominal pain. Bowel movements have been normal.  Dr Elease Hashimoto ordered a Pelvic US done on 11/23/2016, showing a thickened endometrial line at 7 mm.  Patient still has just mild spotting and no pelvic pain.  Mictions and BMs remain normal.   Past medical history,surgical history, problem list, medications, allergies, family history and social history were all reviewed and documented in the EPIC chart.  Directed ROS with pertinent positives and negatives documented in the history of present illness/assessment and plan.  Exam:  Vitals:   12/02/16 1141  BP: 120/78   General appearance:  Normal  Gyn exam:  Vulva:  Atrophy, otherwise normal                     Speculum:  Atrophic vaginitis.  No cervical or vaginal lesion.  Endometrial Bx:  Verbal consent obtained.  Speculum inserted in vagina.  Betadine prep of cervix.  Hurricane spray of cervix.  Single tooth Tenaculum applied on anterior lip of cervix.  Dilation of cervix with Os finder.  Thick dark blood evacuated at Exocervix.  Easy intrauterine insertion of Endometrial Bx canula.  Specimen taken from all surfaces of the intrauterine cavity.  Repeated 4 times because of filling of canula with dark blood on 3 first passes.  All material sent to pathology.  Removal of canula and Tenaculum.  Good  hemostasis.  Speculum removed.  Patient uncomfortable during procedure, but no Cx.  Pelvic US 11/23/2016:  T/A Uterus:  Measurements: 6.7 x 3.4 x 2.8 cm. No fibroids or other mass visualized.  Endometrium:  Thickness: Up to 7 mm, hypoechoic. Suggestion of some fluid within the endometrial canal.  Right ovary:  Measurements: Not identified.  Left ovary:  Measurements: Not identified.  Other findings:  No abnormal free fluid.   Assessment/Plan:  83 y.o.  1. Postmenopausal bleeding R/O Endometrial pathology including Endometrial Hyperplasia and Endometrial Cancer.  Thickened endometrium on US/EBx pending patho.  F/U 1 week to discuss results. - Pathology Report  2. Thickened endometrium As above. - Pathology Report  Counseling on above issues >50% x 30 minutes.  Princess Bruins MD, 12:16 PM 12/02/2016

## 2016-12-04 LAB — PATHOLOGY

## 2016-12-07 NOTE — Progress Notes (Signed)
Left detailed message for daughter regarding result and recommendation. Appt is scheduled for Friday 8/3 but if would like to come sooner since Dr. Marguerita Merles recommended asap just give me a call back.

## 2016-12-08 ENCOUNTER — Telehealth: Payer: Self-pay

## 2016-12-08 NOTE — Telephone Encounter (Addendum)
I spoke with daughter about result yesterday and your recommendation of D&C. They have consultation scheduled Friday to discuss with you.  Daughter called back today to say that patient's PCP Dr. Graceann Congress knows patient and her condition so well. She said he suspected a precancerous condition but had said something to the affect of not sure we would do anything about it based on her age, health and mental status.   Daughter said she would want a conversation between you and Dr. Elease Hashimoto to take place prior to scheduling any surgery/treatment just because "he knows her better than all of [place]".  She said she did not know if you would want to talk with before or after meeting with them.

## 2016-12-09 NOTE — Patient Instructions (Signed)
1. Postmenopausal bleeding R/O Endometrial pathology including Endometrial Hyperplasia and Endometrial Cancer.  Thickened endometrium on US/EBx pending patho.  F/U 1 week to discuss results. - Pathology Report  2. Thickened endometrium As above. - Pathology Report  Dominique Perez, it was a pleasure to meet you today!  I will see you again soon to discuss your results and decide on management.

## 2016-12-10 NOTE — Telephone Encounter (Signed)
I spoke with daughter, Caryl Asp and encouraged her to keep consultation tomorrow at 1:30pm and can discuss all this at that time. She confirmed they will be here for appt.

## 2016-12-11 ENCOUNTER — Ambulatory Visit (INDEPENDENT_AMBULATORY_CARE_PROVIDER_SITE_OTHER): Payer: Medicare Other | Admitting: Obstetrics & Gynecology

## 2016-12-11 ENCOUNTER — Encounter: Payer: Self-pay | Admitting: Obstetrics & Gynecology

## 2016-12-11 VITALS — BP 118/70

## 2016-12-11 DIAGNOSIS — I10 Essential (primary) hypertension: Secondary | ICD-10-CM | POA: Diagnosis not present

## 2016-12-11 DIAGNOSIS — I4891 Unspecified atrial fibrillation: Secondary | ICD-10-CM | POA: Diagnosis not present

## 2016-12-11 DIAGNOSIS — R413 Other amnesia: Secondary | ICD-10-CM

## 2016-12-11 DIAGNOSIS — I2510 Atherosclerotic heart disease of native coronary artery without angina pectoris: Secondary | ICD-10-CM | POA: Diagnosis not present

## 2016-12-11 DIAGNOSIS — Z9861 Coronary angioplasty status: Secondary | ICD-10-CM | POA: Diagnosis not present

## 2016-12-11 DIAGNOSIS — N183 Chronic kidney disease, stage 3 unspecified: Secondary | ICD-10-CM

## 2016-12-11 DIAGNOSIS — N8501 Benign endometrial hyperplasia: Secondary | ICD-10-CM | POA: Diagnosis not present

## 2016-12-11 MED ORDER — MEGESTROL ACETATE 40 MG PO TABS: 40.0000 mg | ORAL_TABLET | Freq: Two times a day (BID) | ORAL | 5 refills | Status: AC

## 2016-12-11 NOTE — Patient Instructions (Signed)
1. Complex endometrial hyperplasia, suspicious for atypia EBx result as described above, small sample, high risk that atypia or Endometrial Ca are present but not picked up by the Bx.  Given that patient is 83 yo with high surgical risk given her cardiac conditions, on anticoagulants, and kidney disease, the decision to proceed with a D+C to establish the diagnosis was questioned.  If a D+C confirmed Endometrial Ca, would we submit the patient to a Hysterectomy?  Dr. Elease Hashimoto (PCP), who knows the patient's medical condition well, participated in the decision making.  We decided to treat with Megace 40 mg BID x 6 months and repeat the EBx then.  If patient develops heavy vaginal bleeding during that time, or if the EBx at f/u shows Endometrial Ca, we will reassess the management plan and consult with Gyn-Onco.  Patient and daughter agree with plan and voiced understanding.    2. CAD S/P percutaneous coronary angioplasty   3. Atrial fibrillation with RVR ([place])   4. Essential hypertension   5. CKD (chronic kidney disease) stage 3, GFR 30-59 ml/min   6. Memory loss  Dominique Perez, it was a pleasure to see you and your family today!  Please call me if you have heavy vaginal bleeding before your next visit for a repeat Endometrial Biopsy in 6 months.

## 2016-12-11 NOTE — Progress Notes (Signed)
    Dominique Perez [birth year] [id number]        83 y.o.  Daughter and Grand-daughter accompanying patient.  Called Dr Elease Hashimoto (PCP) during the visit for opinion on management.  RP:  Discuss results of Endometrial Bx and decide on management  HPI:  Very mild vaginal bleeding after EBx, no current bleeding.  No pelvic pain.  No Fever.  Past medical history,surgical history, problem list, medications, allergies, family history and social history were all reviewed and documented in the EPIC chart.  Directed ROS with pertinent positives and negatives documented in the history of present illness/assessment and plan.  Exam:  Vitals:   12/11/16 1324  BP: 118/70   General appearance:  Normal  Patho: Endometrium - Biopsy:     Focal fragment with endometrial hyperplasia, suspicious for atypia.     See comment.  COMMENT:  Specimen is comprised mostly of blood clot with rare background superficial strips  endometrium. A single nodular fragment of endometrium with increased, crowded and  slightly irregular glands consistent with endometrial hyperplasia is present. Focal areas  are suspicious for atypia. Further evaluation of uterine contents is recommended to  definitively exclude atypia or malignancy.    Assessment/Plan:  83 y.o.   1. Complex endometrial hyperplasia, suspicious for atypia EBx result as described above, small sample, high risk that atypia or Endometrial Ca are present but not picked up by the Bx.  Given that patient is 83 yo with high surgical risk given her cardiac conditions, on anticoagulants, and kidney disease, the decision to proceed with a D+C to establish the diagnosis was questioned.  If a D+C confirmed Endometrial Ca, would we submit the patient to a Hysterectomy?  Dr. Elease Hashimoto (PCP), who knows the patient's medical condition well, participated in the decision making.  We decided to treat with Megace 40 mg BID x 6 months and repeat the EBx then.  If  patient develops heavy vaginal bleeding during that time, or if the EBx at f/u shows Endometrial Ca, we will reassess the management plan and consult with Gyn-Onco.  Patient and daughter agree with plan and voiced understanding.    2. CAD S/P percutaneous coronary angioplasty   3. Atrial fibrillation with RVR ([place])   4. Essential hypertension   5. CKD (chronic kidney disease) stage 3, GFR 30-59 ml/min   6. Memory loss  Counseling on above issues >50% x 25 minutes.  Princess Bruins MD, 1:39 PM 12/11/2016

## 2016-12-22 ENCOUNTER — Encounter: Payer: Self-pay | Admitting: Obstetrics & Gynecology

## 2016-12-26 ENCOUNTER — Other Ambulatory Visit: Payer: Self-pay | Admitting: Family Medicine

## 2016-12-28 ENCOUNTER — Encounter: Payer: Self-pay | Admitting: Obstetrics & Gynecology

## 2016-12-28 NOTE — Telephone Encounter (Signed)
Last refill 05/29/16 and last office visit 11/20/16.  Okay to fill?

## 2016-12-28 NOTE — Telephone Encounter (Signed)
Refill OK

## 2017-01-01 ENCOUNTER — Telehealth: Payer: Self-pay | Admitting: *Deleted

## 2017-01-01 NOTE — Telephone Encounter (Signed)
Prior Authorization for megace 40 mg tablet approved until 12/22/2017, pharmacy aware.

## 2017-01-04 ENCOUNTER — Encounter: Payer: Self-pay | Admitting: Family Medicine

## 2017-01-04 DIAGNOSIS — R413 Other amnesia: Secondary | ICD-10-CM

## 2017-01-18 ENCOUNTER — Telehealth: Payer: Self-pay | Admitting: Family Medicine

## 2017-01-18 NOTE — Telephone Encounter (Signed)
FYI Pt daughter has decline home health service

## 2017-01-18 NOTE — Telephone Encounter (Signed)
noted 

## 2017-01-28 ENCOUNTER — Encounter: Payer: Self-pay | Admitting: Family Medicine

## 2017-02-08 ENCOUNTER — Ambulatory Visit (INDEPENDENT_AMBULATORY_CARE_PROVIDER_SITE_OTHER): Payer: Medicare Other | Admitting: Family Medicine

## 2017-02-08 ENCOUNTER — Encounter: Payer: Self-pay | Admitting: Family Medicine

## 2017-02-08 VITALS — BP 100/60 | HR 65 | Temp 98.6°F | Ht 66.0 in | Wt 132.2 lb

## 2017-02-08 DIAGNOSIS — F0390 Unspecified dementia without behavioral disturbance: Secondary | ICD-10-CM | POA: Diagnosis not present

## 2017-02-08 DIAGNOSIS — N183 Chronic kidney disease, stage 3 unspecified: Secondary | ICD-10-CM

## 2017-02-08 DIAGNOSIS — Z9861 Coronary angioplasty status: Secondary | ICD-10-CM | POA: Diagnosis not present

## 2017-02-08 DIAGNOSIS — I480 Paroxysmal atrial fibrillation: Secondary | ICD-10-CM | POA: Diagnosis not present

## 2017-02-08 DIAGNOSIS — Z23 Encounter for immunization: Secondary | ICD-10-CM

## 2017-02-08 DIAGNOSIS — I2510 Atherosclerotic heart disease of native coronary artery without angina pectoris: Secondary | ICD-10-CM | POA: Diagnosis not present

## 2017-02-08 NOTE — Progress Notes (Signed)
Subjective:     Patient ID: Dominique Perez, female   DOB: 1926-08-11, 83 y.o.   MRN: [id number]  HPI Patient is seen accompanied by daughter with concerns for progressive short-term memory loss and decreasing ability to care for self. Patient currently lives at home 24/7. Daughter is able to check on her frequently but patient has had tremendous decline in recent weeks. She was seen here several months ago with vaginal bleeding and diagnosed with endometrial hyperplasia suspicious for atypia.   GYN had long discussion with patient and daughter about her high surgical risk given her cardiac condition and anticoagulation as well as kidney disease and decision was made to treat her with Megace 40 mg twice a day for 6 months and then consider repeat biopsy in 6 months.  She has had some significant weight loss in recent months. Daughter helps prepare her meals but she is apparently not taking initiative at times.  No specific concerns for depression. Patient is also not been taking her medications appropriately though daughter helps out. Also, daughter has noted that her mom does not seem to making phone calls that she normally would. She had MMSE 27/30 back in July 2017. There been previous discussions of medication such as Aricept and they declined. Previous TSH and B12 testing normal. No recent reported fall or head injury.  She has multiple other problems including history of atrial fibrillation, history of CAD, hypertension, GERD, chronic kidney disease, chronic anticoagulation  Past Medical History:  Diagnosis Date  . Arthritis   . Atrial fibrillation ([place])   . Atrial flutter ([place])   . Coronary artery disease    s/p pci 1999 rca, 2007 diagonal  . Fracture of right femur ([place])   . Gallstones   . GERD (gastroesophageal reflux disease)   . Hyperlipidemia   . Hypertension   . Leg pain, right    chronic  . Tremor    chronic   Past Surgical History:  Procedure Laterality Date  . CARDIAC  CATHETERIZATION  1999    PCI to RCA  . CARDIAC CATHETERIZATION  2007   PCI to diagonal  . CARDIAC CATHETERIZATION  2002   treated medically  . CATARACT EXTRACTION    . femoral pseudoaneurysm  1999   repair  . KNEE SURGERY     right, titanium rod in the right femur  . left knee  2009   replacement by Dr. Wynelle Link  . TOTAL HIP ARTHROPLASTY     Dr. Durward Fortes    reports that she has never smoked. She has never used smokeless tobacco. She reports that she does not drink alcohol or use drugs. family history includes Cancer in her father; Heart attack in her mother. Allergies  Allergen Reactions  . Amiodarone Other (See Comments)    tremor  . Erythromycin Stearate Other (See Comments)    REACTION: fever  . Ferrous Sulfate Other (See Comments)    REACTION: fever  . Iohexol Other (See Comments)     Code: HIVES, Desc: Pt states that 10 yrs ago she IV contrast for a CT and broke out in hives and experienced SOB.  Done w/o iv contrast today., Onset Date: [id number]   . Sulfa Antibiotics Hives     Review of Systems  Constitutional: Positive for appetite change and unexpected weight change. Negative for chills and fever.  Respiratory: Negative for cough and shortness of breath.   Cardiovascular: Negative for chest pain.  Gastrointestinal: Negative for abdominal pain.  Genitourinary: Negative for dysuria.  Neurological: Negative for syncope and headaches.  Psychiatric/Behavioral: Negative for agitation and dysphoric mood.       Objective:   Physical Exam  Constitutional: She appears well-developed and well-nourished.  Cardiovascular: Normal rate.   Pulmonary/Chest: Effort normal and breath sounds normal. No respiratory distress. She has no wheezes. She has no rales.  Musculoskeletal: She exhibits no edema.  Neurological: She is alert.  Psychiatric:  MMSE= 22/30       Assessment:     #1 cognitive impairment. Suspect probable Alzheimer's dementia. She seems to have more  accelerated decline in recent months and is nearing a point where she will not be able to care for self  #2 history of atrial fibrillation on chronic anticoagulation  #3 chronic kidney disease    Plan:     -Long discussion with daughter and patient. We have significant concerns regarding her ability care for self. I feel she is a candidate for nursing home care. She is reaching a point where assisted living would not really be inadequate care for her. -We'll try to set up for daughter to speak with social worker regarding first steps in looking at nursing home care  Eulas Post MD [facility]

## 2017-02-09 ENCOUNTER — Other Ambulatory Visit: Payer: Self-pay | Admitting: Family Medicine

## 2017-02-09 DIAGNOSIS — N183 Chronic kidney disease, stage 3 unspecified: Secondary | ICD-10-CM

## 2017-02-09 DIAGNOSIS — M159 Polyosteoarthritis, unspecified: Secondary | ICD-10-CM

## 2017-02-09 DIAGNOSIS — F0390 Unspecified dementia without behavioral disturbance: Secondary | ICD-10-CM

## 2017-02-09 DIAGNOSIS — R413 Other amnesia: Secondary | ICD-10-CM

## 2017-02-09 DIAGNOSIS — I4891 Unspecified atrial fibrillation: Secondary | ICD-10-CM

## 2017-02-11 ENCOUNTER — Encounter: Payer: Self-pay | Admitting: Family Medicine

## 2017-02-12 ENCOUNTER — Other Ambulatory Visit: Payer: Self-pay | Admitting: *Deleted

## 2017-02-12 NOTE — Patient Outreach (Signed)
[facility]) Care Management  02/12/2017  Ameshia F Sienkiewicz 1926-05-31 [id number]   Telephone Screen  Referral Date: 02/10/17 Referral Source: Dr. Elease Hashimoto office Referral Reason: "Patient needs to be evaluated for possible nursing home placement"  Insurance: Medicare/BCBS Supp  Outreach attempt #1 to patient.  After verifying HIPAA spoke with patient.  Patient unable to answer medical questions and gave permission to contact her daughters.  Called daughter Freda Munro and left HIPAA appropriate message with return call number.  Attempted to call daughter [name] with no answer.  Plan:  RN will re attempt call within 3 business days.   [place] [phone] [email or web address]

## 2017-02-15 ENCOUNTER — Other Ambulatory Visit: Payer: Self-pay | Admitting: *Deleted

## 2017-02-15 NOTE — Patient Outreach (Signed)
[facility]) Care Management  02/15/2017  Rylei F Wachtel August 24, 1926 [id number]   Telephone Screen  Referral Date: 02/10/17 Referral Source: Dr. Elease Hashimoto office Referral Reason: "Patient needs to be evaluated for possible nursing home placement"  Insurance: Medicare/BCBS Supp  Received call back from patient's daughter, [name] this morning.  RN Health Coach asked permission to call daughter back later today.  2nd outreach attempt to reach daughter, Caryl Asp at the requested cell number [phone].  No answer, unable to leave voicemail.   Plan: RN Health Coach will re attempt outreach call within 3 business days, if no return call.  [place] [phone] [email or web address]

## 2017-02-15 NOTE — Patient Outreach (Signed)
[facility]) Care Management  02/15/2017  Joella F Dexheimer Feb 01, 1927 [id number]   Telephone Screen  Referral Date: 02/10/17 Referral Source: Dr. Elease Hashimoto office Referral Reason: "Patient needs to be evaluated for possible nursing home placement"  Insurance: Medicare/BCBS Supp   Outreach Attempt:  Received telephone call back from patient's daughter Caryl Asp [phone]).  HIPAA verified.  Screening call completed.  Daughter request all contact come to her-Joy [facility] [phone].  Social: Caryl Asp, patient's daughter states that patient currently resides at home alone.  Ambulates with a rolling rolator walker.  In the last 60 days patient has been increasingly been getting more forgetful and confused.  Per Joy patient is dependent on all IADLs due to her mentation.  Currently Caryl Asp is having to go to patient's house twice a day to dispense her medications to her, as patient was forgetting to take medications.  Joy states that medications are not in the home, she brings them when she comes.  Patient is not able to prepare meals or do any of the house cleaning.  Family prepares meals and leaves in the refrigerator for patient.  Daughter also states patient is having trouble operating the microwave.  Daughter is afraid patient is a flight risk, as she will get out of the house and walk around the neighborhood.    Conditions:  Per chart review PMH: atrial fibrillation, coronary artery disease, hypertension, GERD, hyperlipidemia, endometrial hyperplasia for which she has not undergone a biopsy for.  Daughter reports patient's mentation and memory has gotten worse over the last several months and patient [place] unable to care for herself.  Daughter is requesting assistance with placement of patient in skilled facility as soon as possible.  Medications: Daughter goes to patient's house twice a day to dispense patient's medications.  Joy states she has taking the medications out of the patients  house and without her going by twice a day patient would not be complaint with medications.  Appointments: Patient seen primary care physician February 04, 2017.  She follows up regularly with her Cardiologist Dr. [place].  Daughter states she provides transportation for patient to her medical appointments.   Advanced Directives: Patient does not currently have an advance directive in place.     Consent: Daughter-Joy gives verbal consent for [facility] services of Social Work to assist with facility placement.  Plan: RN CM will notify [facility] administrative assistant of case status. RN CM will send [facility] SW referral for possible assistance with community resources related to Skilled Placement or Memory Care placement.  [place] [phone] [email or web address]

## 2017-02-16 ENCOUNTER — Ambulatory Visit: Payer: Medicare Other | Admitting: *Deleted

## 2017-02-16 ENCOUNTER — Other Ambulatory Visit: Payer: Self-pay | Admitting: *Deleted

## 2017-02-16 ENCOUNTER — Encounter: Payer: Self-pay | Admitting: *Deleted

## 2017-02-16 NOTE — Patient Outreach (Signed)
[facility]) Care Management  02/16/2017  [name] [birth year] [id number]  Telephone Screen  Referral Date: 02/10/17 Referral Source: Dr. Elease Hashimoto office Referral Reason: "Patient needs to be evaluated for possible nursing home placement"  Insurance: Medicare/BCBS Supp    Returned call to patient's daughter Dominique Perez, as per her request for follow up from conversation yesterday.  Updated Dominique Perez that [facility] Social Work referral has been requested and that I have spoken with the Education officer, museum assigned and reviewed the case with her.  Daughter very Patent attorney.   [place] [phone] [name]

## 2017-02-17 ENCOUNTER — Other Ambulatory Visit: Payer: Self-pay | Admitting: *Deleted

## 2017-02-17 NOTE — Patient Outreach (Signed)
[facility]) Care Management  02/17/2017  Candus F Tobler 1927/04/05 [id number]   CSW made an initial attempt to try and contact patient's daughter, [name] today to perform phone assessment on patient, as well as assess and assist with social needs and services, without success.  A HIPAA compliant message was left for [name] on voicemail and CSW is currently awaiting a return call. CSW will make a second outreach attempt within the next week, if CSW does not receive a return call from [name] in the meantime. Nat Christen, BSW, MSW, LCSW  Licensed Education officer, environmental Health System  Mailing [place] [address] [place], [place] [address] Physical Address-[address], [place], [place] [address] Toll Free Main # [phone] Fax # [phone] Cell # [phone]  Office # [phone] [email or web address]

## 2017-02-22 ENCOUNTER — Other Ambulatory Visit: Payer: Self-pay | Admitting: *Deleted

## 2017-02-22 ENCOUNTER — Encounter: Payer: Self-pay | Admitting: *Deleted

## 2017-02-22 NOTE — Patient Outreach (Signed)
[place] [facility]) Care Management  02/22/2017  Dominique Perez 02-03-1927 [id number]   CSW was able to make initial contact with patient's daughter, [name] today to perform the phone assessment on patient, as well as assess and assist with social work needs and services.  CSW introduced self, explained role and types of services provided through [facility] ([facility]).  CSW further explained to [name] that CSW works with patient's Primary Care Physician, also with [facility], Dr. Carolann Littler. CSW then explained the reason for the call, indicating that Dr. Elease Hashimoto thought that patient would benefit from social work services and resources to assist with long-term care placement arrangements in a skilled nursing facility.  CSW obtained two HIPAA compliant identifiers from [name], which included patient's name and date of birth. [name] admitted that she is currently the sole caregiver for patient, which is becoming more than she can handle, as she is still working 40 hours per week and required to travel with her job.  [name] reported that her immediate need is to try and get some help for patient in the home, while she is away.  CSW provided [name] with the following link, PearCard.com.au, explaining that the Medicare. Gov website provides a complete listing of all approved home health agencies, personal care service agencies and private agency sitters.  [name] reported that she would visit the website as soon as the call was terminated, hopeful that she will be able to get respite care services in the place for patient within the next week. CSW also spoke with [name] at length about long-term care placement arrangements for patient.  [name] reported that Dr. Elease Hashimoto is now recommending skilled level care for patient, being that patient  requires 24 hour care and supervision.  CSW agreed to contact Dr. Elease Hashimoto to request a completed and signed FL-2 Form, necessary for placement purposes.  In addition, CSW will mail [name] a complete list of all skilled nursing facilities in [place].  As soon as the completed and signed FL-2 Form is received, CSW will fax patient's information to all skilled nursing facilities of choice.  [name] has been charged with the task of reviewing the list of skilled nursing facilities, touring the ones of interest and then report findings to [place]. CSW agreed to follow-up with [name] on Thursday, October 18th, hopeful that CSW will have the completed and signed FL-2 Form from Dr. Elease Hashimoto, so that [place] can fax patient's information to all skilled nursing facilities of choice and provide bed offers to [name].  [name] voiced understanding and was agreeable to this plan.  [name] reported that patient will not be able to private pay in a skilled setting; therefore, wanting to apply for Long-Term Care Medicaid for patient.  CSW agreed to mail [name] a [employer] application, as she agreed to complete the application and submit directly to the [place] for processing.  The application and list of skilled nursing facilities will be mailed to the following address:  [address] [place], [place] [address]. Nat Christen, BSW, MSW, LCSW  Licensed Education officer, environmental Health System  Mailing [place] [address], [place], [place] [address] Physical Address-[address], [place], [place] [address] Toll Free Main # [phone] Fax # [phone] Cell # [phone]  Office # [phone] [email or web address]

## 2017-02-22 NOTE — Patient Outreach (Signed)
Request received from Joanna Saporito, LCSW to mail patient personal care resources.  Information mailed today. 

## 2017-02-23 ENCOUNTER — Ambulatory Visit: Payer: Medicare Other | Admitting: Podiatry

## 2017-02-24 ENCOUNTER — Ambulatory Visit (INDEPENDENT_AMBULATORY_CARE_PROVIDER_SITE_OTHER): Payer: Medicare Other | Admitting: Podiatry

## 2017-02-24 ENCOUNTER — Encounter: Payer: Self-pay | Admitting: Podiatry

## 2017-02-24 DIAGNOSIS — M79672 Pain in left foot: Secondary | ICD-10-CM | POA: Diagnosis not present

## 2017-02-24 DIAGNOSIS — B351 Tinea unguium: Secondary | ICD-10-CM

## 2017-02-24 DIAGNOSIS — M79671 Pain in right foot: Secondary | ICD-10-CM

## 2017-02-24 NOTE — Progress Notes (Signed)
Subjective: 83 y.o. year old female patient presents via wheelchair accompanied by her daughter complaining of painful nails. Patient requests toe nails, corns and calluses trimmed. Her last visit to this office was 10 months ago in 04/21/16 for the same problem. Patient is pleasant, coherent, and well oriented.  Objective: Dermatologic: Thick yellow deformed nails x 10.  Plantar medial callus under the left foot bunion. Vascular: Pedal pulses are palpable on DP and not palpable on PT bilateral. Orthopedic: Contracted lesser digits with lateral deviation bilateral.  Severe hallux valgus with bunion deformities bilateral. Flat arch L>R with weight bearing. Neurologic: All epicritic and tactile sensations grossly intact.  Assessment: Dystrophic mycotic nails x 10. Severe digital deformities.  Treatment: All mycotic nails and calluses debrided.  Return in 3 months or as needed.

## 2017-02-24 NOTE — Patient Instructions (Signed)
Seen for hypertrophic nails and calluses. All nails and calluses debrided. No new problems noted. No open skin lesions noted. Return in 3 months or as needed.

## 2017-02-25 ENCOUNTER — Encounter: Payer: Self-pay | Admitting: Family Medicine

## 2017-02-25 ENCOUNTER — Other Ambulatory Visit: Payer: Self-pay | Admitting: *Deleted

## 2017-02-25 NOTE — Patient Outreach (Signed)
[facility]) Care Management  02/25/2017  Roselyne RAY GLACKEN [birth year] [id number]   CSW made an attempt to try and contact patient's sister, [name] today to follow-up regarding social work services and resources; however, [name] was unavailable at the time of CSW's call.  A HIPAA compliant message was left for [name] on voicemail and CSW is currently awaiting a return call.  CSW will make a second outreach attempt within the next week, if CSW does not receive a return call from [name] in the meantime. Nat Christen, BSW, MSW, LCSW  Licensed Education officer, environmental Health System  Mailing [place] [address], [place], [place] [address] Physical Address-[address], [place], [place] [address] Toll Free Main # [phone] Fax # [phone] Cell # [phone]  Office # [phone] [email or web address]

## 2017-03-02 ENCOUNTER — Ambulatory Visit (INDEPENDENT_AMBULATORY_CARE_PROVIDER_SITE_OTHER): Payer: Medicare Other | Admitting: Physician Assistant

## 2017-03-02 ENCOUNTER — Encounter: Payer: Self-pay | Admitting: Physician Assistant

## 2017-03-02 VITALS — BP 138/88 | HR 57 | Resp 16 | Ht 64.5 in | Wt 138.0 lb

## 2017-03-02 DIAGNOSIS — I2510 Atherosclerotic heart disease of native coronary artery without angina pectoris: Secondary | ICD-10-CM

## 2017-03-02 DIAGNOSIS — I10 Essential (primary) hypertension: Secondary | ICD-10-CM

## 2017-03-02 DIAGNOSIS — F0390 Unspecified dementia without behavioral disturbance: Secondary | ICD-10-CM | POA: Diagnosis not present

## 2017-03-02 DIAGNOSIS — Z9861 Coronary angioplasty status: Secondary | ICD-10-CM

## 2017-03-02 DIAGNOSIS — I480 Paroxysmal atrial fibrillation: Secondary | ICD-10-CM

## 2017-03-02 DIAGNOSIS — E785 Hyperlipidemia, unspecified: Secondary | ICD-10-CM | POA: Diagnosis not present

## 2017-03-02 NOTE — Progress Notes (Addendum)
Cardiology Office Note    Date:  03/02/2017   ID:  Sophia M Gienger, DOB Oct 07, 1926, MRN [id number]  PCP:  Eulas Post, MD  Cardiologist:  Dr. Martinique  Chief Complaint  Patient presents with  . Coronary Artery Disease  . Follow-up    seen for Dr. Martinique    History of Present Illness:  Nyima M Ertle is a 83 y.o. female with PMH of PAF on multaq and Xarelto, CAD, HTN, HLD, chronic tremor and worsening dementia. She had a history of stenting of RCA in 1999 and stenting of diagonal in 2007 with DES. Last echocardiogram obtained on 02/25/2011 showed EF 55-60%, mild LVH, nodular calcification of anterior mitral leaflet, mild to moderate TR, PA peak pressure 36 mmHg. She was seen in the ED in August 2017 with atrial fibrillation with RVR. She converted in the ED. She had a recurrent episode in November 2017, she called EMS however she was in sinus rhythm by the time they got there. She was last seen by Dr. Martinique on 07/01/2016, at which time she was doing well but had intermittent breakthrough on Multaq. She was instructed to take additional metoprolol for breakthrough episodes.   Patient was brought in by her daughter for cardiology office visit. For the past 2 months, she has had significant mental status decline. Despite this, she continued to live by herself. Her children has been managing her medications. However they are very concerned for her well-being and fear that she is no longer able to take care of herself. They have been discussing with her primary care provider's office in order to find placement for her. Otherwise she denies any significant chest discomfort or shortness of breath. O2 saturation 96%. On physical exam, she does have intermittent right-sided crackle. Otherwise she does not appear to be volume overloaded, there is no lower extremity edema or JVD, I think the crackle likely represent atelectasis. I informed his family to continue to monitor for any shortness of breath.  Otherwise, patient cannot recall much of anything today and keep looking at his daughter for answers. Her heart rate is in the 50s, she still have intermittent PACs, I am unable to up titrate the metoprolol. She has not had any falling episode, however at this point I do not think Xarelto will improve her quality of life. I discussed briefly with the family regarding switching Xarelto to aspirin while tolerating higher probability of stroke, the daughter is very interested in this. I will discuss the case with Dr. Martinique to get his opinion as well.    Past Medical History:  Diagnosis Date  . Arthritis   . Atrial fibrillation ([place])   . Atrial flutter ([place])   . Coronary artery disease    s/p pci 1999 rca, 2007 diagonal  . Fracture of right femur ([place])   . Gallstones   . GERD (gastroesophageal reflux disease)   . Hyperlipidemia   . Hypertension   . Leg pain, right    chronic  . Tremor    chronic  . Uterus cancer ([place]) 10/2016   Dr. Dellis Filbert    Past Surgical History:  Procedure Laterality Date  . CARDIAC CATHETERIZATION  1999    PCI to RCA  . CARDIAC CATHETERIZATION  2007   PCI to diagonal  . CARDIAC CATHETERIZATION  2002   treated medically  . CATARACT EXTRACTION    . femoral pseudoaneurysm  1999   repair  . KNEE SURGERY     right, titanium rod  in the right femur  . left knee  2009   replacement by Dr. Wynelle Link  . TOTAL HIP ARTHROPLASTY     Dr. Durward Fortes    Current Medications: Outpatient Medications Prior to Visit  Medication Sig Dispense Refill  . dronedarone (MULTAQ) 400 MG tablet TAKE 1 BY MOUTH TWICE DAILY WITH A MEAL 180 tablet 3  . isosorbide mononitrate (IMDUR) 60 MG 24 hr tablet TAKE 1 TABLET (60 MG TOTAL) BY MOUTH DAILY. 90 tablet 3  . lansoprazole (PREVACID) 30 MG capsule Take 1 capsule (30 mg total) by mouth daily at 12 noon. (Patient taking differently: Take 30 mg by mouth. Patient takes in evening with meal) 90 capsule 3  . megestrol (MEGACE) 40 MG tablet Take  1 tablet (40 mg total) by mouth 2 (two) times daily. 60 tablet 5  . metoprolol tartrate (LOPRESSOR) 25 MG tablet Take 1 tablet (25 mg total) by mouth 2 (two) times daily. 180 tablet 3  . Rivaroxaban (XARELTO) 15 MG TABS tablet Take 1 tablet by mouth every morning with breakfast 90 tablet 3  . simvastatin (ZOCOR) 20 MG tablet Take 0.5 tablets (10 mg total) by mouth daily at 6 PM. 45 tablet 3  . traMADol-acetaminophen (ULTRACET) 37.5-325 MG tablet TAKE 1 TABLET BY MOUTH EVERY 6 HOURS 120 tablet 0  . Multiple Vitamins-Minerals (CENTRUM SILVER PO) Take 1 tablet by mouth daily.     No facility-administered medications prior to visit.      Allergies:   Amiodarone; Erythromycin stearate; Ferrous sulfate; Iohexol; and Sulfa antibiotics   Social History   Social History  . Marital status: Widowed    Spouse name: N/A  . Number of children: 4  . Years of education: N/A   Social History Main Topics  . Smoking status: Never Smoker  . Smokeless tobacco: Never Used  . Alcohol use No  . Drug use: No  . Sexual activity: No   Other Topics Concern  . None   Social History Narrative  . None     Family History:  The patient's family history includes Cancer in her father; Heart attack in her mother.   ROS:   Please see the history of present illness.    ROS All other systems reviewed and are negative.   PHYSICAL EXAM:   VS:  BP 138/88   Pulse (!) 57   Resp 16   Ht 5' 4.5" (1.638 m)   Wt 138 lb (62.6 kg)   SpO2 96%   BMI 23.32 kg/m    GEN: frail, confused, in no acute distress  HEENT: normal  Neck: no JVD, carotid bruits, or masses Cardiac: RRR; no murmurs, rubs, or gallops,no edema  Respiratory:  Intermittent crackle on the right GI: soft, nontender, nondistended, + BS MS: no deformity or atrophy  Skin: warm and dry, no rash Neuro:  Confused. Strength and sensation are intact Psych: flat affect  Wt Readings from Last 3 Encounters:  03/02/17 138 lb (62.6 kg)  02/08/17 132 lb  3.2 oz (60 kg)  11/20/16 146 lb 14.4 oz (66.6 kg)      Studies/Labs Reviewed:   EKG:  EKG is ordered today.  The ekg ordered today demonstrates Sinus bradycardia with PACs.  Recent Labs: 07/01/2016: ALT 10; BUN 11; Creat 1.11; Potassium 4.4; Sodium 140 11/20/2016: Hemoglobin 12.5; Platelets 221.0   Lipid Panel    Component Value Date/Time   CHOL 162 07/01/2016 1624   TRIG 77 07/01/2016 1624   HDL 88 07/01/2016 1624  CHOLHDL 1.8 07/01/2016 1624   VLDL 15 07/01/2016 1624   LDLCALC 59 07/01/2016 1624    Additional studies/ records that were reviewed today include:   Echo 02/25/2011 LV EF: 55% -  60%  Study Conclusions  - Left ventricle: The cavity size was normal. Wall thickness was increased in a pattern of mild LVH. Systolic function was normal. The estimated ejection fraction was in the range of 55% to 60%. - Mitral valve: Nodular calcification of anterior leaflet tip Mild regurgitation. - Left atrium: The atrium was mildly dilated. - Atrial septum: No defect or patent foramen ovale was identified. - Tricuspid valve: Mild-moderate regurgitation. - Pulmonary arteries: PA peak pressure: 68mm Hg (S). - Pericardium, extracardiac: A trivial pericardial effusion was identified.    ASSESSMENT:    1. PAF (paroxysmal atrial fibrillation) ([place])   2. Coronary artery disease involving native coronary artery of native heart without angina pectoris   3. Essential hypertension   4. Hyperlipidemia, unspecified hyperlipidemia type   5. Dementia without behavioral disturbance, unspecified dementia type      PLAN:  In order of problems listed above:  1. PAF: Currently controlled on metoprolol, multaq and Xarelto. Her mental status has progressed to the degree that she is no longer able to manage her medication and she cannot give any reliable history. She is living by herself, family is no longer able to take care of her at this point, now talking with his PCP  regarding potential placement for her. At this point, the Xarelto likely no longer offer her improved quality of life. I will discussed with Dr. Martinique to see if he will recommend switching the patient to aspirin.  2. CAD: Denies any chest pain or shortness breath. Does have intermittent crackle in the right lung, however more likely to be atelectasis than fluid.  3. Hypertension: Blood pressure stable.  4. Hyperlipidemia: Continue Zocor 10 mg daily, cholesterol well controlled in February 2018.  5. Worsening dementia: Moderate to severe dementia by this point, she is no longer able to take care of herself.    Medication Adjustments/Labs and Tests Ordered: Current medicines are reviewed at length with the patient today.  Concerns regarding medicines are outlined above.  Medication changes, Labs and Tests ordered today are listed in the Patient Instructions below. Patient Instructions  Medication Instructions:   No changes  Labwork:   none  Testing/Procedures:  none  Follow-Up:  With Dr. Martinique in 6 months. We will send a reminder letter when it is time to schedule this appointment.  If you need a refill on your cardiac medications before your next appointment, please call your pharmacy.      Hilbert Corrigan, [place]  03/02/2017 10:50 AM    [address], [place], [place]  [address] Phone: [phone]; Fax: [phone]

## 2017-03-02 NOTE — Patient Instructions (Signed)
Medication Instructions:   No changes  Labwork:   none  Testing/Procedures:  none  Follow-Up:  With Dr. Martinique in 6 months. We will send a reminder letter when it is time to schedule this appointment.  If you need a refill on your cardiac medications before your next appointment, please call your pharmacy.

## 2017-03-03 ENCOUNTER — Telehealth: Payer: Self-pay | Admitting: Physician Assistant

## 2017-03-03 ENCOUNTER — Encounter: Payer: Self-pay | Admitting: *Deleted

## 2017-03-03 ENCOUNTER — Other Ambulatory Visit: Payer: Self-pay | Admitting: *Deleted

## 2017-03-03 MED ORDER — ASPIRIN EC 81 MG PO TBEC: 81.0000 mg | DELAYED_RELEASE_TABLET | Freq: Every day | ORAL | Status: AC

## 2017-03-03 NOTE — Telephone Encounter (Signed)
Dominique Perez called checking on a FL2 form that she has faxed. Dominique Kindle would like for you to call her back at: [phone].

## 2017-03-03 NOTE — Telephone Encounter (Signed)
I have discussed with Dr. Martinique and called patient's caretaker Dominique Perez, given Dominique Perez progressive dementia and decreased quality of life. We will take her off of Xarelto to avoid unnecessary bleeding risk and switch her to 81mg  aspirin. I have discussed with patient and her daughter during the office yesterday, they are aware of increased stroke risk on the aspirin. She can come off of Prevacid as well as long as she does not have abdominal irritation on aspirin.  Hilbert Corrigan PA Pager: [phone]

## 2017-03-03 NOTE — Patient Outreach (Signed)
[facility]) Care Management  03/03/2017  Jonet JANNY CRUTE [birth year] [id number]   CSW was able to make contact with patient's daughter, [name] today to follow-up regarding long-term care placement arrangements for patient in a skilled nursing facility.  CSW explained to [name] that CSW has emailed a blank FL-2 Form to patient's Primary Care Physician, Dr. Carolann Littler and is currently awaiting a return call from Dr. Erick Blinks Nurse, Apolonio Schneiders with regards to when the FL-2 will be completed, signed and ready for pick-up.  CSW is hopeful that the FL-2 Form will be ready for pick-up on the morning of Thursday, October 25th, so that CSW can begin faxing the FL-2 Form to all skilled nursing facilities of choice to pursue placement options. [name] report that she was able to meet with a representative with the [place], hospital, who will be patient's Case Worker moving forward.  In meeting with Mrs. Paylor, [name] submitted patient's completed and signed Long-Term Care Medicaid application and was assigned a pending Long-Term Care Medicaid number for CSW to start the long-term care facilities interview process.  Mrs. Kathryne Sharper can be reached at # [phone].  Mrs. Kathryne Sharper reported that CSW will have 45 days to initiate long-term care placement, under the current Long-Term Care Medicaid application. CSW agreed to follow-up with Mrs. [place] as soon as the completed and signed FL-2 Form has been faxed to all facilities of choice, which is planned for tomorrow (Thursday, October 25th at 1:00pm).  [name] voiced understanding and was agreeable to this plan.  CSW will continue to await a return call from [place] with Dr. Erick Blinks office. Nat Christen, BSW, MSW, LCSW  Licensed Education officer, environmental Health System  Mailing [place] [address] [place], [place]  [address] Physical Address-[address], [place], [place] [address] Toll Free Main # [phone] Fax # [phone] Cell # [phone]  Office # [phone] [email or web address]

## 2017-03-03 NOTE — Telephone Encounter (Signed)
Thanks for the feedback.   I think this makes good sense with her increased risk of falls.

## 2017-03-04 ENCOUNTER — Other Ambulatory Visit: Payer: Self-pay | Admitting: *Deleted

## 2017-03-04 NOTE — Patient Outreach (Signed)
[facility]) Care Management  03/04/2017  Charlayne SHALI VESEY 04-12-1927 [id number]   CSW was able to make contact with patient's daughter, [name] today to follow-up regarding long-term care placement arrangements for patient in a skilled nursing facility.  CSW explained to [name] that CSW was able to obtain the completed and signed FL-2 Form from patient's Primary Care Physician, Dr. Carolann Littler and that CSW will be faxing to all facilities of choice today.  These skilled nursing facilities of choice include all of the following: [facility] [place] at [facility] On the fax cover sheet, CSW has requested that the admissions coordinators at each of the facilities contact [name] directly when making bed offers to help expedite the process.  [name] agreed to follow-up with CSW as soon as bed offers are received so that CSW can proceed with the placement process.  CSW will make arrangements to contact [name] early next week, if a return call from [name] is not received in the meantime.  [name] voiced understanding and was agreeable to this plan. Nat Christen, BSW, MSW, LCSW  Licensed Education officer, environmental Health System  Mailing [place] [address] [place], [place] [address] Physical Address[address], [place], [place] [address] Toll Free Main # [phone] Fax # [phone] Cell # [phone]  Office # [phone] [email or web address]

## 2017-03-06 ENCOUNTER — Other Ambulatory Visit: Payer: Self-pay | Admitting: Family Medicine

## 2017-03-09 ENCOUNTER — Other Ambulatory Visit: Payer: Self-pay | Admitting: *Deleted

## 2017-03-09 NOTE — Patient Outreach (Signed)
[place] [facility]) Care Management  03/09/2017  Dominique Perez 02-02-1927 [id number]  CSW was able to make contact with patient's daughter, [name] today to follow-up regarding long-term care placement arrangements for patient.  [name] reported that she was able to make several calls to various long-term care skilled nursing facilities yesterday and the results are as follows: [facility] and [facility] will not waive the first 30 days of skilled care under patient's Medicare benefit; therefore, patient would be private pay for the first 30 days of skilled care.  In other words, patient would not be eligible for Long-Term Care Medicaid until final approval is made.   [facility] does not currently have any Long-Term Care Medicaid female beds available.  Patient will be placed on the waiting list for the next Long-Term Care Medicaid female bed. [facility] agreed to contact Mrs. [place] by Wednesday, October 31st to determine eligibility and patient's appropriateness for their facility.  [facility] is willing to accept patient with Long-Term Care Medicaid pending, and is also agreeable to retro pay for patient's stay until patient is officially approved for Long-Term Care Medicaid. [facility] is waiting for [name] to submit admissions paperwork for patient, also agreeing to accept patient into their facility while the Long-Term Care Medicaid is still pending. [name] indicated that she called all the other facilities on the list, in which patient's FL-2 Form was faxed to, but has yet to receive a return call.  [name] plans to follow-up with each of these facilities again today to determine eligibility.  [name] agreed to follow-up with CSW to report findings.  CSW agreed to contact the admissions department at [facility] and [facility] to try and help expedite the placement process. Nat Christen, BSW, MSW, LCSW  Licensed Education officer, environmental Health System  Mailing [place] [address] [place], [place] [address] Physical Address[address], [place], [place] [address] Toll Free Main # [phone] Fax # [phone] Cell # [phone]  Office # [phone] [email or web address]

## 2017-03-10 ENCOUNTER — Encounter: Payer: Self-pay | Admitting: Physician Assistant

## 2017-03-10 NOTE — Telephone Encounter (Signed)
This needs to go to primary care.  Peter Martinique MD, [facility]

## 2017-03-12 ENCOUNTER — Other Ambulatory Visit: Payer: Self-pay | Admitting: *Deleted

## 2017-03-12 MED ORDER — TRAMADOL-ACETAMINOPHEN 37.5-325 MG PO TABS: 1.0000 | ORAL_TABLET | Freq: Four times a day (QID) | ORAL | 0 refills | Status: AC

## 2017-03-12 NOTE — Telephone Encounter (Signed)
Refill okayed by Georgina Snell.

## 2017-03-12 NOTE — Patient Outreach (Signed)
[facility]) Care Management  03/12/2017  Dominique Perez 08-Apr-1927 [id number]   CSW was able to make contact with patient's daughter, [name] today to follow-up regarding long-term care placement arrangements for patient.  CSW has since learned that [facility] will not be able to accept patient into their facility, as they do not feel that they are equipped to manage patient's care long-term.  [facility] also mentioned that they do not feel that patient is qualified for skilled care for just custodial purposes.  Last, [facility] does not accept Long-Term Care Medicaid for assisted living level care. [name] and CSW are still awaiting a return call from the administrator at [facility] with regards to whether or not they will be able to accept patient for long-term skilled care.  However, [name] was told by the admissions coordinator at [facility] that they do not currently have any long-term care skilled medicaid bed available.  However, CSW was told that [facility] just filled the only two female long-term care skilled medicaid beds that were available. CSW received a "no" from [employer] and no return calls have been received from the other facilities in which patient's FL-2 Form was faxed, totally 7.  CSW will continue to make calls to all the facilities of choice to pursue bed offers before re-faxing patient's information to other facilities.  CSW offered counseling and supportive services to [name], as she is feeling very discouraged at this mood.  [name] reported that it is getting harder and harder for her to manage patient's care at home, as patient is becoming increasingly confused. Nat Christen, BSW, MSW, LCSW  Licensed Education officer, environmental Health System  Mailing [place] [address], [place], [place] [address] Physical Address-[address], [place], [place] [address] Toll Free Main # [phone] Fax # [phone] Cell # [phone]  Office # [phone] [name]

## 2017-03-16 ENCOUNTER — Other Ambulatory Visit: Payer: Self-pay | Admitting: *Deleted

## 2017-03-16 NOTE — Patient Outreach (Signed)
[facility]) Care Management  03/16/2017  [place] ARRON MCNAUGHT [birth year] [id number]   CSW made an attempt to try and contact patient's daughter, [name] today to follow-up regarding long-term care placement arrangements for patient; however, [name] was unavailable at the time of CSW's call.  A HIPAA compliant message was left for [name] on voicemail and CSW is currently awaiting a return call.  CSW will make a second outreach attempt within the next week, if CSW does not receive a return call from [name] in the meantime. Nat Christen, BSW, MSW, LCSW  Licensed Education officer, environmental Health System  Mailing [place] [address], [place], [place] [address] Physical Address[address] [place], [place],  [address] Toll Free Main # [phone] Fax # [phone] Cell # [phone]  Office # [phone] [email or web address]

## 2017-03-17 ENCOUNTER — Other Ambulatory Visit: Payer: Self-pay | Admitting: *Deleted

## 2017-03-17 NOTE — Patient Outreach (Addendum)
[place] [facility]) Care Management  03/17/2017  Dominique Perez June 14, 1926 [id number]   CSW received a return call from patient's daughter, Dominique Perez today in regards to the HIPAA compliant message left for her on voicemail last evening by CSW.  CSW inquired about the status of patient's long-term care placement arrangements.  [name] reported that she has spoken with numerous facilities that indicate that patient does not meet criteria for skilled level of care, but rather "Special Care Unit".  [name] requested that CSW contact patient's Primary Care Physician, Dr. Carolann Littler to see if he would be willing to change patient's level of care status on her FL-2 Form from SNF ([facility]) to Other - Special Care Unit.  In addition, the primary diagnosis listed for patient needs to read "Memory Loss/Dementia".  Otherwise, everything else on the FL-2 Form that was originally generated should meet criteria.  CSW has agreed to fill out the form in it's entirety and fax to Dr. Elease Hashimoto for review and signature. The following Assisted Living Facilities are currently of interest to Mrs. [place] and all offer the "Memory Care/Wander Guard System": [facility] CSW agreed to fax patient's FL-2 Form to all the above named facilities as soon as the FL-2 Form is received.  [name] plans to tour these four facilities on Thursday and Friday of this week.  CSW has already been in contact with patient's Medicaid Case Worker at the [facility] to ensure that patient's [place] Medicaid will cover this level of care.  [facility] CM Care Plan Problem One     Most Recent Value  Care Plan Problem One  Level of Care Issues.  Role Documenting the Problem One  Clinical Social Worker  Care Plan for Problem One  Active  [facility] Long Term Goal   Patient  will be placed in a skilled nursing facility of choice for long-term care services, within the next 45 days.  [facility] Long Term Goal Start Date  02/22/17  Interventions for Problem One Long Term Goal  CSW will assist patient and patient's daughter with arranging long-term care services for patient.  [facility] CM Short Term Goal #1   CSW will contact patient's Primary Care Physician to request a completed and signed FL-2 Form for patient, within the next week.  [facility] CM Short Term Goal #1 Start Date  02/22/17  [facility] CM Short Term Goal #1 Met Date  03/04/17  Interventions for Short Term Goal #1  CSW will then fax the completed and signed FL-2 Form to all skilled nursing facilities of choice.  [facility] CM Short Term Goal #2   Patient and daughter will complete a Long-Term Care Medicaid application and submit for processing, within the next three weeks.  [facility] CM Short Term Goal #2 Start Date  02/22/17  [facility] CM Short Term Goal #2 Met Date  03/03/17  Interventions for Short Term Goal #2  CSW will mail patient and patient's daughter a Long-Term Care Medicaid application.  [facility] CM Short Term Goal #3  Patient's daughter and CSW will follow-up with all SNF's FL-2 Form was faxed to to follow-up regarding long-term care placement arrangements for patient, within the next week.  [facility] CM Short Term Goal #3 Start Date  03/12/17  [facility] CM Short Term Goal #3 Met Date  03/17/17  Interventions for Short Tern Goal #3  Call are being made to facilities  of choice to request a bed offer for patient.     Nat Christen, BSW, MSW, LCSW  Licensed Education officer, environmental Health System  [place]ailing [place] [address], [place], [place] [address] Physical Address-[address], [place], [place] [address] Toll Free Main # [phone] Fax # [phone] Cell # [phone]  Office # [phone] [email or web address]

## 2017-03-18 NOTE — Telephone Encounter (Signed)
Looks like patient's daughter is trying to get the record

## 2017-03-22 ENCOUNTER — Other Ambulatory Visit: Payer: Self-pay | Admitting: *Deleted

## 2017-03-22 ENCOUNTER — Encounter: Payer: Self-pay | Admitting: Family Medicine

## 2017-03-22 NOTE — Patient Outreach (Signed)
[facility]) Care Management  03/22/2017  Britnay SHANECE COCHRANE 01-18-1927 [id number]   CSW was able to make contact with patient's daughter, [name] today to follow-up regarding long-term care placement arrangements for patient.  [name] is requesting an updated list of facilities that are available for Special Assistance Medicaid Memory Care, as she has since learned from patient's Medicaid Case Worker at the [place] that patient does not qualify for skilled care, from a physical standpoint.  Nor does patient qualify for assisted living level care because her income exceeds the guidelines.  Patient does not qualify for Medicaid Assisted Living, according to patient's Medicaid Case Worker.  [name] is leaving to go out of town on Friday, March 26, 2017, admitting that she is desperate to get patient placed before Friday, as she is currently patient's sole care provider.  CSW plans to refax patient's FL-2 Form to all skilled nursing facilities, as well as memory care/wander guard assisted living facilities, not just facilities of choice, receiving verbal consent from [name]. Nat Christen, BSW, MSW, LCSW  Licensed Education officer, environmental Health System  Mailing [place] [address], [place], [place] [address] Physical Address-[address], [place], [place] [address] Toll Free Main # [phone] Fax # [phone] Cell # [phone]  Office # [phone] [email or web address]

## 2017-03-25 ENCOUNTER — Other Ambulatory Visit: Payer: Self-pay | Admitting: *Deleted

## 2017-03-25 NOTE — Patient Outreach (Signed)
[facility]) Care Management  03/25/2017  Aija JONIECE SMOTHERMAN 04/23/1927 [id number]   CSW was able to make contact with patient's daughter, [name] today to follow-up regarding long-term care placement arrangements for patient.  Patient recently underwent a "memory screening" at [facility] and was not deemed "memory impaired enough" to qualify for any level of care at their facility.  [name] reported that she is in the process of "spending down" patient's assets so that patient will qualify for memory impaired assisted living level of care.  [name] is working with patient's [place] Medicaid Case Worker at the [facility], as well as with a representative from [employer] policy, to try and get patient's burial plots paid for so that [name] can quickly spend down patient's bank account.  [name] has been told by patient's Long-Term Care Medicaid Case Worker that as soon as patient's account is at $2,000.00 or less, patient will automatically be eligible for Long-Term Care Medicaid and [name] will have no trouble getting patient into an assisted living facility that offers memory impaired services.  [name] is hopeful that this will take place within the next few weeks.  In the meantime, [name] will be leaving to go out of town for a week, so she has made arrangements for her sister to come and stay with patient while she is away.  CSW agreed to follow-up with Mrs. [place] within the next two weeks to check the status of patient's "spend down" process.  Patient's current FL-2 Form is valid until April 15, 2017.  Otherwise, CSW has already agreed to request a new completed and signed FL-2 Form from patient's Primary Care Physician, Dr. Carolann Littler. Nat Christen, BSW, MSW, LCSW  Licensed Education officer, environmental Health System   Mailing [place] [address], [place], [place] [address] Physical Address-[address], [place], [place] [address] Toll Free Main # [phone] Fax # [phone] Cell # [phone]  Office # [phone] [email or web address]

## 2017-04-05 ENCOUNTER — Other Ambulatory Visit: Payer: Self-pay | Admitting: *Deleted

## 2017-04-05 NOTE — Patient Outreach (Signed)
[facility]) Care Management  04/05/2017  Siddalee PERRIS TRIPATHI 03-Apr-1927 [id number]   CSW was able to make contact with patient's daughter, [name] today to follow-up regarding long-term care placement arrangements for patient in an assisted living facility.  [name] is just returning home from vacation and indicated that she has nothing new to report.  [name] stated that she is still waiting for patient to be approved for Long-Term Care Medicaid, as the application is still being processed at the [facility] and may take a few more weeks.  [name] admitted that she has still not received any bed offers but is hopeful that this will change, once patient has been approved for Long-Term Care Medicaid.  CSW agreed to follow-up with [name] in two weeks. THN CM Care Plan Problem One     Most Recent Value  Care Plan Problem One  Level of Care Issues.  Role Documenting the Problem One  Clinical Social Worker  Care Plan for Problem One  Active  [facility] Long Term Goal   Patient will be placed in a skilled nursing facility of choice for long-term care services, within the next 45 days.  THN Long Term Goal Start Date  02/22/17  Interventions for Problem One Long Term Goal  CSW will assist patient and patient's daughter with arranging long-term care services for patient.  THN CM Short Term Goal #1   CSW will contact patient's Primary Care Physician to request a completed and signed FL-2 Form for patient, within the next week.  THN CM Short Term Goal #1 Start Date  02/22/17  [facility] CM Short Term Goal #1 Met Date  03/04/17  Interventions for Short Term Goal #1  CSW will then fax the completed and signed FL-2 Form to all skilled nursing facilities of choice.  THN CM Short Term Goal #2   Patient and daughter will complete a Long-Term Care Medicaid application and submit for processing, within the next three weeks.  THN CM Short Term Goal #2  Start Date  02/22/17  [facility] CM Short Term Goal #2 Met Date  03/03/17  Interventions for Short Term Goal #2  CSW will mail patient and patient's daughter a Long-Term Care Medicaid application.  THN CM Short Term Goal #3  Patient's daughter and CSW will follow-up with all SNF's FL-2 Form was faxed to to follow-up regarding long-term care placement arrangements for patient, within the next week.  THN CM Short Term Goal #3 Start Date  03/12/17  [facility] CM Short Term Goal #3 Met Date  03/17/17  Interventions for Short Tern Goal #3  Call are being made to facilities of choice to request a bed offer for patient.  THN CM Short Term Goal #4  CSW will refax patient's upated FL-2 Form to all skilled nursing facilities and all assisted living facilities to try and pursue bed offers from patient, within the next 24 hours.  THN CM Short Term Goal #4 Start Date  03/22/17  [facility] CM Short Term Goal #4 Met Date  04/05/17  Interventions for Short Term Goal #4  Updated FL-2 Form has been obtained from patient's Primary Care Physician.     Nat Christen, BSW, MSW, LCSW  Licensed Education officer, environmental Health System  Mailing [place] [address] [place], [place] [address] Physical Address-[address], [place], [place] [address] Toll Free Main # [phone] Fax # [phone] Cell # [phone]  Office #  [email or web address]

## 2017-04-19 ENCOUNTER — Other Ambulatory Visit: Payer: Self-pay | Admitting: *Deleted

## 2017-04-19 NOTE — Patient Outreach (Signed)
[facility]) Care Management  04/19/2017  Dominique Perez [birth year] [id number]   CSW was able to make contact with patient's daughter, Dominique Perez today to follow-up regarding long-term care placement arrangements for patient.  Dominique Perez reported that [name] with [facility] performed another evaluation on patient and determined that she is now appropriate for placement at their facility.  Dominique Perez is requesting a new completed and signed FL-2 Form on patient, providing a little more detailed information.  CSW agreed to contact patient's Primary Care Physician, Dr. Carolann Perez to request a new FL-2 Form on patient, providing more information about patient's memory deficits so that patient will qualify for placement in [facility].  The previous FL-2 Form has expired, as it has been greater than 30 days.  Once CSW is able to get a copy of the new FL-2 Form, CSW will provide a copy to Dominique Perez so that she can revisit the [place] to reapply for [facility], as this application has also lapsed.  In the meantime, Dominique Perez is awaiting a [employer] check from [employer] to begin the "spend down" process on patient's behalf.  CSW agreed to follow-up with Dominique Perez as soon as the new FL-2 Form is obtained, or within the next week and a half, whichever comes first. [facility] CM Care Plan Problem One     Most Recent Value  Care Plan Problem One  Level of Care Issues.  Role Documenting the Problem One  Clinical Social Worker  Care Plan for Problem One  Active  [facility] Long Term Goal   Patient will be placed in a skilled nursing facility of choice for long-term care services, within the next 45 days.  THN Long Term Goal Start Date  02/22/17  Interventions for Problem One Long Term Goal  CSW will assist patient and patient's daughter with arranging long-term care services for patient.  THN CM Short  Term Goal #1   CSW will contact patient's Primary Care Physician to request a completed and signed FL-2 Form for patient, within the next week.  THN CM Short Term Goal #1 Start Date  02/22/17  [facility] CM Short Term Goal #1 Met Date  03/04/17  Interventions for Short Term Goal #1  CSW will then fax the completed and signed FL-2 Form to all skilled nursing facilities of choice.  THN CM Short Term Goal #2   Patient and daughter will complete a Long-Term Care Medicaid application and submit for processing, within the next three weeks.  THN CM Short Term Goal #2 Start Date  02/22/17  [facility] CM Short Term Goal #2 Met Date  03/03/17  Interventions for Short Term Goal #2  CSW will mail patient and patient's daughter a Long-Term Care Medicaid application.  THN CM Short Term Goal #3  Patient's daughter and CSW will follow-up with all SNF's FL-2 Form was faxed to to follow-up regarding long-term care placement arrangements for patient, within the next week.  THN CM Short Term Goal #3 Start Date  03/12/17  [facility] CM Short Term Goal #3 Met Date  03/17/17  Interventions for Short Tern Goal #3  Call are being made to facilities of choice to request a bed offer for patient.  THN CM Short Term Goal #4  CSW will refax patient's upated FL-2 Form to all skilled nursing facilities and all assisted living facilities to try and pursue bed offers from patient, within the next  24 hours.  THN CM Short Term Goal #4 Start Date  03/22/17  [facility] CM Short Term Goal #4 Met Date  04/05/17  Interventions for Short Term Goal #4  Updated FL-2 Form has been obtained from patient's Primary Care Physician.  THN CM Short Term Goal #5   Patient's daughter will take new completed and signed FL-2 Form to DSS to apply for Long-Term Care Medicaid for patient, within the next two weeks.  THN CM Short Term Goal #5 Start Date  04/19/17  Interventions for Short Term Goal #5  CSW will request that patient's primary care physician complete a new FL-2 Form on  patient.     Dominique Perez, BSW, MSW, LCSW  Licensed Education officer, environmental Health System  Mailing [place] [address], [place], [place] [address] Physical Address-[address], [place], [place] [address] Toll Free Main # [phone] Fax # [phone] Cell # [phone]  Office # [phone] [email or web address]

## 2017-04-23 ENCOUNTER — Encounter: Payer: Self-pay | Admitting: Family Medicine

## 2017-04-23 ENCOUNTER — Encounter: Payer: Self-pay | Admitting: Obstetrics & Gynecology

## 2017-04-28 NOTE — Telephone Encounter (Signed)
Paul Macksood picked up the [facility] form

## 2017-04-29 ENCOUNTER — Other Ambulatory Visit: Payer: Self-pay | Admitting: *Deleted

## 2017-04-29 NOTE — Patient Outreach (Signed)
[facility]) Care Management  04/29/2017  Orit JALAYIAH BIBIAN [birth year] [id number]   CSW was able to make contact with patient's daughter, [name] today to follow-up regarding long-term care placement arrangements for patient.  [name] was able to pick up the new completed and signed FL-2 Form from patient's Primary Care Physician, Dr. Darnell Level Burchette's office.  [name] plans to take the new FL-2 Form to the [place] (DSS) today to reapply for Long-Term Care Medicaid for patient.  [name] has already been told by a Medicaid case worker at [facility] that patient will be eligible for Long-Term Care Medicaid, based on information from previous FL-2 Form and income guidelines.  [name] will then take a copy of the new FL-2 Form to [facility], to provide them with the updated information.  A bed offer has already been made and our hopes is that patient will be able to be placed at [facility] within the next week.  CSW will continue to follow along to assist with placement arrangements.  CSW will follow-up with Mrs. Rosser within the next two weeks to assess and assist as needed. THN CM Care Plan Problem One     Most Recent Value  Care Plan Problem One  Level of Care Issues.  Role Documenting the Problem One  Clinical Social Worker  Care Plan for Problem One  Active  [facility] Long Term Goal   Patient will be placed in a skilled nursing facility of choice for long-term care services, within the next 45 days.  THN Long Term Goal Start Date  02/22/17  Interventions for Problem One Long Term Goal  CSW will assist patient and patient's daughter with arranging long-term care services for patient.  THN CM Short Term Goal #1   CSW will contact patient's Primary Care Physician to request a completed and signed FL-2 Form for patient, within the next week.  THN CM Short Term Goal #1 Start Date  02/22/17  [facility] CM  Short Term Goal #1 Met Date  03/04/17  Interventions for Short Term Goal #1  CSW will then fax the completed and signed FL-2 Form to all skilled nursing facilities of choice.  THN CM Short Term Goal #2   Patient and daughter will complete a Long-Term Care Medicaid application and submit for processing, within the next three weeks.  THN CM Short Term Goal #2 Start Date  02/22/17  [facility] CM Short Term Goal #2 Met Date  03/03/17  Interventions for Short Term Goal #2  CSW will mail patient and patient's daughter a Long-Term Care Medicaid application.  THN CM Short Term Goal #3  Patient's daughter and CSW will follow-up with all SNF's FL-2 Form was faxed to to follow-up regarding long-term care placement arrangements for patient, within the next week.  THN CM Short Term Goal #3 Start Date  03/12/17  [facility] CM Short Term Goal #3 Met Date  03/17/17  Interventions for Short Tern Goal #3  Call are being made to facilities of choice to request a bed offer for patient.  THN CM Short Term Goal #4  CSW will refax patient's upated FL-2 Form to all skilled nursing facilities and all assisted living facilities to try and pursue bed offers from patient, within the next 24 hours.  THN CM Short Term Goal #4 Start Date  03/22/17  [facility] CM Short Term Goal #4 Met Date  04/05/17  Interventions for Short Term Goal #4  Updated FL-2 Form has been obtained from patient's Primary Care Physician.  THN CM Short Term Goal #5   Patient's daughter will take new completed and signed FL-2 Form to DSS to apply for Long-Term Care Medicaid for patient, within the next two weeks.  THN CM Short Term Goal #5 Start Date  04/19/17  [facility] CM Short Term Goal #5 Met Date  04/29/17  Interventions for Short Term Goal #5  CSW will request that patient's primary care physician complete a new FL-2 Form on patient.    Nat Christen, BSW, MSW, LCSW  Licensed Education officer, environmental Health System  Mailing  [place] [address], [place], [place] [address] Physical Address-[address], [place], [place] [address] Toll Free Main # [phone] Fax # [phone] Cell # [phone]  Office # [phone] [email or web address]

## 2017-05-10 ENCOUNTER — Ambulatory Visit (INDEPENDENT_AMBULATORY_CARE_PROVIDER_SITE_OTHER): Payer: Medicare Other

## 2017-05-10 ENCOUNTER — Encounter: Payer: Self-pay | Admitting: *Deleted

## 2017-05-10 ENCOUNTER — Other Ambulatory Visit: Payer: Self-pay | Admitting: *Deleted

## 2017-05-10 DIAGNOSIS — Z111 Encounter for screening for respiratory tuberculosis: Secondary | ICD-10-CM | POA: Diagnosis not present

## 2017-05-10 NOTE — Progress Notes (Signed)
Pt had an appointment for a PPD placement on 05/10/2017, pt needs the PPD for a nursing home, placement was done on her left arm, pt was instructed to return for the reading in 72 hrs (Wed 05/12/2017). Pt torelated the PPD with no problems.

## 2017-05-10 NOTE — Patient Outreach (Signed)
[facility]) Care Management  05/10/2017  Dominique Perez 10/06/1926 [id number]  CSW was able to make contact with patient's daughter, Dominique Perez today to follow-up regarding long-term care placement arrangements for patient.  [name] explained to [place] that patient has already undergone the PPD test, requirement for [facility], and will go back to her Primary Care Physician's office, Dr. Carolann Perez, on Wednesday, May 12, 2017 to have it read.  [name] is hopeful that patient will be able to be placed at [facility] within the next week, as all necessary paperwork and documentation are in place.  [name] denied having any additional social work needs at present.   CSW will perform a case closure on patient, as all goals of treatment have been met from social work standpoint and no additional social work needs have been identified at this time.  CSW will fax an update to patient's Primary Care Physician, Dr. Carolann Perez to ensure that they are aware of CSW's involvement with patient's plan of care.  CSW will submit a case closure request to Alycia Rossetti, Care Management Assistant with [facility], in the form of an [employer].   THN CM Care Plan Problem One     Most Recent Value  Care Plan Problem One  Level of Care Issues.  Role Documenting the Problem One  Clinical Social Worker  Care Plan for Problem One  Active  [facility] Long Term Goal   Patient will be placed in a skilled nursing facility of choice for long-term care services, within the next 45 days.  THN Long Term Goal Start Date  02/22/17  THN Long Term Goal Met Date  05/10/17  Interventions for Problem One Long Term Goal  CSW will assist patient and patient's daughter with arranging long-term care services for patient.  THN CM Short Term Goal #1   CSW will contact patient's Primary Care Physician to request a completed and signed  FL-2 Form for patient, within the next week.  THN CM Short Term Goal #1 Start Date  02/22/17  [facility] CM Short Term Goal #1 Met Date  03/04/17  Interventions for Short Term Goal #1  CSW will then fax the completed and signed FL-2 Form to all skilled nursing facilities of choice.  THN CM Short Term Goal #2   Patient and daughter will complete a Long-Term Care Medicaid application and submit for processing, within the next three weeks.  THN CM Short Term Goal #2 Start Date  02/22/17  [facility] CM Short Term Goal #2 Met Date  03/03/17  Interventions for Short Term Goal #2  CSW will mail patient and patient's daughter a Long-Term Care Medicaid application.  THN CM Short Term Goal #3  Patient's daughter and CSW will follow-up with all SNF's FL-2 Form was faxed to to follow-up regarding long-term care placement arrangements for patient, within the next week.  THN CM Short Term Goal #3 Start Date  03/12/17  [facility] CM Short Term Goal #3 Met Date  03/17/17  Interventions for Short Tern Goal #3  Call are being made to facilities of choice to request a bed offer for patient.  THN CM Short Term Goal #4  CSW will refax patient's upated FL-2 Form to all skilled nursing facilities and all assisted living facilities to try and pursue bed offers from patient, within the next 24 hours.  THN CM Short Term Goal #4 Start Date  03/22/17  [facility]  CM Short Term Goal #4 Met Date  04/05/17  Interventions for Short Term Goal #4  Updated FL-2 Form has been obtained from patient's Primary Care Physician.  THN CM Short Term Goal #5   Patient's daughter will take new completed and signed FL-2 Form to DSS to apply for Long-Term Care Medicaid for patient, within the next two weeks.  THN CM Short Term Goal #5 Start Date  04/19/17  [facility] CM Short Term Goal #5 Met Date  04/29/17  Interventions for Short Term Goal #5  CSW will request that patient's primary care physician complete a new FL-2 Form on patient.     Nat Christen, BSW, MSW, LCSW   Licensed Education officer, environmental Health System  Mailing [place] [address], [place], [place] [address] Physical Address-[address], [place],  [address] Toll Free Main # [phone] Fax # [phone] Cell # [phone]  Office # [phone] [email or web address]

## 2017-05-12 ENCOUNTER — Ambulatory Visit (INDEPENDENT_AMBULATORY_CARE_PROVIDER_SITE_OTHER): Payer: Medicare Other | Admitting: Family Medicine

## 2017-05-12 ENCOUNTER — Telehealth: Payer: Self-pay

## 2017-05-12 ENCOUNTER — Encounter: Payer: Self-pay | Admitting: Family Medicine

## 2017-05-12 VITALS — BP 110/70 | HR 62 | Temp 98.2°F | Wt 139.1 lb

## 2017-05-12 DIAGNOSIS — F0390 Unspecified dementia without behavioral disturbance: Secondary | ICD-10-CM | POA: Diagnosis not present

## 2017-05-12 DIAGNOSIS — I10 Essential (primary) hypertension: Secondary | ICD-10-CM | POA: Diagnosis not present

## 2017-05-12 DIAGNOSIS — R600 Localized edema: Secondary | ICD-10-CM

## 2017-05-12 LAB — TB SKIN TEST
INDURATION: 0 mm
TB SKIN TEST: NEGATIVE

## 2017-05-12 MED ORDER — FUROSEMIDE 20 MG PO TABS: 20.0000 mg | ORAL_TABLET | Freq: Every day | ORAL | 2 refills | Status: AC

## 2017-05-12 NOTE — Telephone Encounter (Signed)
Pt TB test was read this afternoon, the reading results was Negative and duration was  0 mm. Patient results was faxed to [facility]. A copy was given to pt daughter.

## 2017-05-12 NOTE — Progress Notes (Signed)
Subjective:     Patient ID: Dominique Perez, female   DOB: 1927-03-08, 84 y.o.   MRN: [id number]  HPI Patient was here today basically to have tuberculosis skin test read. Her daughter noticed some relatively acute worsening of edema left leg greater than right. She's not had any dyspnea or pleuritic pain or chest pain. No history of known heart failure. She has multiple chronic problems that include history of CAD, atrial fibrillation, hypertension, GERD, dementia, chronic kidney disease stage III.  Echo 2012- normal LV function.  She is in process of being placed in assisted living facility.  Past Medical History:  Diagnosis Date  . Arthritis   . Atrial fibrillation ([place])   . Atrial flutter ([place])   . Coronary artery disease    s/p pci 1999 rca, 2007 diagonal  . Fracture of right femur ([place])   . Gallstones   . GERD (gastroesophageal reflux disease)   . Hyperlipidemia   . Hypertension   . Leg pain, right    chronic  . Tremor    chronic  . Uterus cancer ([place]) 10/2016   Dr. Dellis Filbert   Past Surgical History:  Procedure Laterality Date  . CARDIAC CATHETERIZATION  1999    PCI to RCA  . CARDIAC CATHETERIZATION  2007   PCI to diagonal  . CARDIAC CATHETERIZATION  2002   treated medically  . CATARACT EXTRACTION    . femoral pseudoaneurysm  1999   repair  . KNEE SURGERY     right, titanium rod in the right femur  . left knee  2009   replacement by Dr. Wynelle Link  . TOTAL HIP ARTHROPLASTY     Dr. Durward Fortes    reports that  has never smoked. she has never used smokeless tobacco. She reports that she does not drink alcohol or use drugs. family history includes Cancer in her father; Heart attack in her mother. Allergies  Allergen Reactions  . Amiodarone Other (See Comments)    tremor  . Erythromycin Stearate Other (See Comments)    REACTION: fever  . Ferrous Sulfate Other (See Comments)    REACTION: fever  . Iohexol Other (See Comments)     Code: HIVES, Desc: Pt states that 10  yrs ago she IV contrast for a CT and broke out in hives and experienced SOB.  Done w/o iv contrast today., Onset Date: [id number]   . Sulfa Antibiotics Hives     Review of Systems  Constitutional: Negative for fatigue.  Eyes: Negative for visual disturbance.  Respiratory: Negative for cough, chest tightness, shortness of breath and wheezing.   Cardiovascular: Positive for leg swelling. Negative for chest pain and palpitations.  Gastrointestinal: Negative for abdominal pain.  Neurological: Negative for dizziness, seizures, syncope, weakness, light-headedness and headaches.       Objective:   Physical Exam  Constitutional: She appears well-developed and well-nourished.  Eyes: Pupils are equal, round, and reactive to light.  Neck: Neck supple. No JVD present. No thyromegaly present.  Cardiovascular: Normal rate. Exam reveals no gallop.  Pulmonary/Chest: Effort normal and breath sounds normal. No respiratory distress. She has no wheezes. She has no rales.  Musculoskeletal: She exhibits edema.  Patient has pitting edema both legs left greater than right. No specific calf or thigh tenderness  Neurological: She is alert.       Assessment:     Bilateral leg edema which is asymmetric left greater than right-question multifactorial as she is essentially nonambulatory which I'm sure exacerbates her edema  Plan:     -Elevate legs frequently -Check further labs with comprehensive metabolic panel, BNP level, d-dimer -May need to consider venous Dopplers left lower extremity -Start furosemide 20 mg once daily-for 5 days and then reassess edema. -may benefit from leg compression hose.  Eulas Post MD [facility]

## 2017-05-12 NOTE — Patient Instructions (Signed)
Elevate legs frequently Start the Furosemide 20 mg one daily for 5 days. Follow up immediately for any increased shortness of breath or chest pain.

## 2017-05-13 ENCOUNTER — Ambulatory Visit (HOSPITAL_COMMUNITY)
Admission: RE | Admit: 2017-05-13 | Discharge: 2017-05-13 | Disposition: A | Discharge status: Home / Self Care | Payer: Medicare Other | Source: Ambulatory Visit | Attending: Cardiovascular Disease | Admitting: Cardiovascular Disease

## 2017-05-13 ENCOUNTER — Other Ambulatory Visit: Payer: Self-pay | Admitting: Family Medicine

## 2017-05-13 DIAGNOSIS — M79605 Pain in left leg: Secondary | ICD-10-CM | POA: Diagnosis not present

## 2017-05-13 LAB — COMPREHENSIVE METABOLIC PANEL
ALBUMIN: 3.4 g/dL — AB (ref 3.5–5.2)
ALT: 5 U/L (ref 0–35)
AST: 11 U/L (ref 0–37)
Alkaline Phosphatase: 70 U/L (ref 39–117)
BUN: 22 mg/dL (ref 6–23)
CHLORIDE: 110 meq/L (ref 96–112)
CO2: 24 mEq/L (ref 19–32)
Calcium: 8.6 mg/dL (ref 8.4–10.5)
Creatinine, Ser: 1.57 mg/dL — ABNORMAL HIGH (ref 0.40–1.20)
GFR: 33.04 mL/min — ABNORMAL LOW (ref >60.00)
Glucose, Bld: 108 mg/dL — ABNORMAL HIGH (ref 70–99)
Potassium: 3.8 mEq/L (ref 3.5–5.1)
SODIUM: 142 meq/L (ref 135–145)
Total Bilirubin: 0.4 mg/dL (ref 0.2–1.2)
Total Protein: 6.4 g/dL (ref 6.0–8.3)

## 2017-05-13 LAB — D-DIMER, QUANTITATIVE (NOT AT ARMC): D DIMER QUANT: 17.32 ug{FEU}/mL — AB (ref <0.50)

## 2017-05-14 ENCOUNTER — Encounter: Payer: Self-pay | Admitting: Family Medicine

## 2017-05-18 DIAGNOSIS — N189 Chronic kidney disease, unspecified: Secondary | ICD-10-CM | POA: Diagnosis not present

## 2017-05-18 DIAGNOSIS — R4181 Age-related cognitive decline: Secondary | ICD-10-CM | POA: Diagnosis not present

## 2017-05-18 DIAGNOSIS — Z66 Do not resuscitate: Secondary | ICD-10-CM | POA: Diagnosis not present

## 2017-05-18 DIAGNOSIS — Z79899 Other long term (current) drug therapy: Secondary | ICD-10-CM | POA: Diagnosis not present

## 2017-05-18 DIAGNOSIS — R262 Difficulty in walking, not elsewhere classified: Secondary | ICD-10-CM | POA: Diagnosis not present

## 2017-05-18 DIAGNOSIS — I10 Essential (primary) hypertension: Secondary | ICD-10-CM | POA: Diagnosis not present

## 2017-05-18 DIAGNOSIS — G309 Alzheimer's disease, unspecified: Secondary | ICD-10-CM | POA: Diagnosis not present

## 2017-05-27 ENCOUNTER — Ambulatory Visit: Payer: Medicare Other | Admitting: Podiatry

## 2017-05-27 DIAGNOSIS — G309 Alzheimer's disease, unspecified: Secondary | ICD-10-CM | POA: Diagnosis not present

## 2017-05-27 DIAGNOSIS — M6281 Muscle weakness (generalized): Secondary | ICD-10-CM | POA: Diagnosis not present

## 2017-05-27 DIAGNOSIS — F0281 Dementia in other diseases classified elsewhere with behavioral disturbance: Secondary | ICD-10-CM | POA: Diagnosis not present

## 2017-05-27 DIAGNOSIS — F419 Anxiety disorder, unspecified: Secondary | ICD-10-CM | POA: Diagnosis not present

## 2017-05-27 DIAGNOSIS — R2681 Unsteadiness on feet: Secondary | ICD-10-CM | POA: Diagnosis not present

## 2017-05-27 DIAGNOSIS — R2689 Other abnormalities of gait and mobility: Secondary | ICD-10-CM | POA: Diagnosis not present

## 2017-05-27 DIAGNOSIS — G4700 Insomnia, unspecified: Secondary | ICD-10-CM | POA: Diagnosis not present

## 2017-05-27 DIAGNOSIS — Z79899 Other long term (current) drug therapy: Secondary | ICD-10-CM | POA: Diagnosis not present

## 2017-05-31 DIAGNOSIS — R2689 Other abnormalities of gait and mobility: Secondary | ICD-10-CM | POA: Diagnosis not present

## 2017-05-31 DIAGNOSIS — R2681 Unsteadiness on feet: Secondary | ICD-10-CM | POA: Diagnosis not present

## 2017-05-31 DIAGNOSIS — M6281 Muscle weakness (generalized): Secondary | ICD-10-CM | POA: Diagnosis not present

## 2017-06-01 DIAGNOSIS — R2681 Unsteadiness on feet: Secondary | ICD-10-CM | POA: Diagnosis not present

## 2017-06-01 DIAGNOSIS — R2689 Other abnormalities of gait and mobility: Secondary | ICD-10-CM | POA: Diagnosis not present

## 2017-06-01 DIAGNOSIS — M6281 Muscle weakness (generalized): Secondary | ICD-10-CM | POA: Diagnosis not present

## 2017-06-02 DIAGNOSIS — R2681 Unsteadiness on feet: Secondary | ICD-10-CM | POA: Diagnosis not present

## 2017-06-02 DIAGNOSIS — M6281 Muscle weakness (generalized): Secondary | ICD-10-CM | POA: Diagnosis not present

## 2017-06-02 DIAGNOSIS — R2689 Other abnormalities of gait and mobility: Secondary | ICD-10-CM | POA: Diagnosis not present

## 2017-06-03 DIAGNOSIS — R2681 Unsteadiness on feet: Secondary | ICD-10-CM | POA: Diagnosis not present

## 2017-06-03 DIAGNOSIS — M6281 Muscle weakness (generalized): Secondary | ICD-10-CM | POA: Diagnosis not present

## 2017-06-03 DIAGNOSIS — R2689 Other abnormalities of gait and mobility: Secondary | ICD-10-CM | POA: Diagnosis not present

## 2017-06-04 DIAGNOSIS — R2681 Unsteadiness on feet: Secondary | ICD-10-CM | POA: Diagnosis not present

## 2017-06-04 DIAGNOSIS — M6281 Muscle weakness (generalized): Secondary | ICD-10-CM | POA: Diagnosis not present

## 2017-06-04 DIAGNOSIS — R2689 Other abnormalities of gait and mobility: Secondary | ICD-10-CM | POA: Diagnosis not present

## 2017-06-07 DIAGNOSIS — R2689 Other abnormalities of gait and mobility: Secondary | ICD-10-CM | POA: Diagnosis not present

## 2017-06-07 DIAGNOSIS — M6281 Muscle weakness (generalized): Secondary | ICD-10-CM | POA: Diagnosis not present

## 2017-06-07 DIAGNOSIS — R2681 Unsteadiness on feet: Secondary | ICD-10-CM | POA: Diagnosis not present

## 2017-06-08 DIAGNOSIS — R2689 Other abnormalities of gait and mobility: Secondary | ICD-10-CM | POA: Diagnosis not present

## 2017-06-08 DIAGNOSIS — M6281 Muscle weakness (generalized): Secondary | ICD-10-CM | POA: Diagnosis not present

## 2017-06-08 DIAGNOSIS — R2681 Unsteadiness on feet: Secondary | ICD-10-CM | POA: Diagnosis not present

## 2017-06-09 DIAGNOSIS — R2689 Other abnormalities of gait and mobility: Secondary | ICD-10-CM | POA: Diagnosis not present

## 2017-06-09 DIAGNOSIS — R2681 Unsteadiness on feet: Secondary | ICD-10-CM | POA: Diagnosis not present

## 2017-06-09 DIAGNOSIS — M6281 Muscle weakness (generalized): Secondary | ICD-10-CM | POA: Diagnosis not present

## 2017-06-10 DIAGNOSIS — G4700 Insomnia, unspecified: Secondary | ICD-10-CM | POA: Diagnosis not present

## 2017-06-10 DIAGNOSIS — M6281 Muscle weakness (generalized): Secondary | ICD-10-CM | POA: Diagnosis not present

## 2017-06-10 DIAGNOSIS — F419 Anxiety disorder, unspecified: Secondary | ICD-10-CM | POA: Diagnosis not present

## 2017-06-10 DIAGNOSIS — R2689 Other abnormalities of gait and mobility: Secondary | ICD-10-CM | POA: Diagnosis not present

## 2017-06-10 DIAGNOSIS — G309 Alzheimer's disease, unspecified: Secondary | ICD-10-CM | POA: Diagnosis not present

## 2017-06-10 DIAGNOSIS — R2681 Unsteadiness on feet: Secondary | ICD-10-CM | POA: Diagnosis not present

## 2017-06-10 DIAGNOSIS — F0281 Dementia in other diseases classified elsewhere with behavioral disturbance: Secondary | ICD-10-CM | POA: Diagnosis not present

## 2017-06-11 DIAGNOSIS — F411 Generalized anxiety disorder: Secondary | ICD-10-CM | POA: Diagnosis not present

## 2017-06-11 DIAGNOSIS — F331 Major depressive disorder, recurrent, moderate: Secondary | ICD-10-CM | POA: Diagnosis not present

## 2017-06-13 ENCOUNTER — Encounter (HOSPITAL_COMMUNITY): Payer: Self-pay | Admitting: Emergency Medicine

## 2017-06-13 ENCOUNTER — Inpatient Hospital Stay (HOSPITAL_COMMUNITY)
Admission: EM | Admit: 2017-06-13 | Discharge: 2017-07-09 | DRG: 871 | Disposition: E | Payer: Medicare Other | Attending: Family Medicine | Admitting: Family Medicine

## 2017-06-13 ENCOUNTER — Emergency Department (HOSPITAL_COMMUNITY): Payer: Medicare Other

## 2017-06-13 ENCOUNTER — Other Ambulatory Visit: Payer: Self-pay

## 2017-06-13 DIAGNOSIS — E872 Acidosis: Secondary | ICD-10-CM | POA: Diagnosis present

## 2017-06-13 DIAGNOSIS — J181 Lobar pneumonia, unspecified organism: Secondary | ICD-10-CM | POA: Diagnosis present

## 2017-06-13 DIAGNOSIS — R32 Unspecified urinary incontinence: Secondary | ICD-10-CM | POA: Diagnosis present

## 2017-06-13 DIAGNOSIS — I2510 Atherosclerotic heart disease of native coronary artery without angina pectoris: Secondary | ICD-10-CM | POA: Diagnosis present

## 2017-06-13 DIAGNOSIS — Z8249 Family history of ischemic heart disease and other diseases of the circulatory system: Secondary | ICD-10-CM | POA: Diagnosis not present

## 2017-06-13 DIAGNOSIS — J9601 Acute respiratory failure with hypoxia: Secondary | ICD-10-CM | POA: Diagnosis present

## 2017-06-13 DIAGNOSIS — K219 Gastro-esophageal reflux disease without esophagitis: Secondary | ICD-10-CM | POA: Diagnosis present

## 2017-06-13 DIAGNOSIS — Z66 Do not resuscitate: Secondary | ICD-10-CM | POA: Diagnosis present

## 2017-06-13 DIAGNOSIS — N179 Acute kidney failure, unspecified: Secondary | ICD-10-CM | POA: Diagnosis present

## 2017-06-13 DIAGNOSIS — I480 Paroxysmal atrial fibrillation: Secondary | ICD-10-CM | POA: Diagnosis not present

## 2017-06-13 DIAGNOSIS — Z8542 Personal history of malignant neoplasm of other parts of uterus: Secondary | ICD-10-CM | POA: Diagnosis not present

## 2017-06-13 DIAGNOSIS — Z7189 Other specified counseling: Secondary | ICD-10-CM

## 2017-06-13 DIAGNOSIS — Z6823 Body mass index (BMI) 23.0-23.9, adult: Secondary | ICD-10-CM | POA: Diagnosis not present

## 2017-06-13 DIAGNOSIS — I10 Essential (primary) hypertension: Secondary | ICD-10-CM

## 2017-06-13 DIAGNOSIS — Z881 Allergy status to other antibiotic agents status: Secondary | ICD-10-CM

## 2017-06-13 DIAGNOSIS — J101 Influenza due to other identified influenza virus with other respiratory manifestations: Secondary | ICD-10-CM | POA: Diagnosis not present

## 2017-06-13 DIAGNOSIS — R64 Cachexia: Secondary | ICD-10-CM | POA: Diagnosis present

## 2017-06-13 DIAGNOSIS — F0390 Unspecified dementia without behavioral disturbance: Secondary | ICD-10-CM | POA: Diagnosis present

## 2017-06-13 DIAGNOSIS — Z888 Allergy status to other drugs, medicaments and biological substances status: Secondary | ICD-10-CM

## 2017-06-13 DIAGNOSIS — Z515 Encounter for palliative care: Secondary | ICD-10-CM | POA: Diagnosis not present

## 2017-06-13 DIAGNOSIS — I129 Hypertensive chronic kidney disease with stage 1 through stage 4 chronic kidney disease, or unspecified chronic kidney disease: Secondary | ICD-10-CM | POA: Diagnosis present

## 2017-06-13 DIAGNOSIS — N183 Chronic kidney disease, stage 3 unspecified: Secondary | ICD-10-CM | POA: Diagnosis present

## 2017-06-13 DIAGNOSIS — R069 Unspecified abnormalities of breathing: Secondary | ICD-10-CM | POA: Diagnosis not present

## 2017-06-13 DIAGNOSIS — J189 Pneumonia, unspecified organism: Secondary | ICD-10-CM | POA: Diagnosis not present

## 2017-06-13 DIAGNOSIS — R001 Bradycardia, unspecified: Secondary | ICD-10-CM | POA: Diagnosis not present

## 2017-06-13 DIAGNOSIS — R159 Full incontinence of feces: Secondary | ICD-10-CM | POA: Diagnosis present

## 2017-06-13 DIAGNOSIS — A419 Sepsis, unspecified organism: Principal | ICD-10-CM

## 2017-06-13 DIAGNOSIS — Y95 Nosocomial condition: Secondary | ICD-10-CM | POA: Diagnosis present

## 2017-06-13 DIAGNOSIS — R0602 Shortness of breath: Secondary | ICD-10-CM | POA: Diagnosis not present

## 2017-06-13 DIAGNOSIS — Z79899 Other long term (current) drug therapy: Secondary | ICD-10-CM | POA: Diagnosis not present

## 2017-06-13 DIAGNOSIS — R5383 Other fatigue: Secondary | ICD-10-CM | POA: Diagnosis not present

## 2017-06-13 DIAGNOSIS — E785 Hyperlipidemia, unspecified: Secondary | ICD-10-CM | POA: Diagnosis present

## 2017-06-13 DIAGNOSIS — J09X1 Influenza due to identified novel influenza A virus with pneumonia: Secondary | ICD-10-CM | POA: Diagnosis present

## 2017-06-13 DIAGNOSIS — Z91041 Radiographic dye allergy status: Secondary | ICD-10-CM

## 2017-06-13 DIAGNOSIS — R000 Tachycardia, unspecified: Secondary | ICD-10-CM | POA: Diagnosis not present

## 2017-06-13 DIAGNOSIS — Z7982 Long term (current) use of aspirin: Secondary | ICD-10-CM | POA: Diagnosis not present

## 2017-06-13 DIAGNOSIS — Z955 Presence of coronary angioplasty implant and graft: Secondary | ICD-10-CM

## 2017-06-13 DIAGNOSIS — R509 Fever, unspecified: Secondary | ICD-10-CM | POA: Diagnosis not present

## 2017-06-13 HISTORY — DX: Paroxysmal atrial fibrillation: I48.0

## 2017-06-13 HISTORY — DX: Unspecified dementia, unspecified severity, without behavioral disturbance, psychotic disturbance, mood disturbance, and anxiety: F03.90

## 2017-06-13 HISTORY — DX: Lymphedema, not elsewhere classified: I89.0

## 2017-06-13 HISTORY — DX: Personal history of other medical treatment: Z92.89

## 2017-06-13 LAB — COMPREHENSIVE METABOLIC PANEL
ALBUMIN: 2.4 g/dL — AB (ref 3.5–5.0)
ALK PHOS: 105 U/L (ref 38–126)
ALT: 15 U/L (ref 14–54)
ANION GAP: 16 — AB (ref 5–15)
AST: 36 U/L (ref 15–41)
BUN: 27 mg/dL — ABNORMAL HIGH (ref 6–20)
CALCIUM: 8.5 mg/dL — AB (ref 8.9–10.3)
CO2: 18 mmol/L — ABNORMAL LOW (ref 22–32)
Chloride: 107 mmol/L (ref 101–111)
Creatinine, Ser: 1.85 mg/dL — ABNORMAL HIGH (ref 0.44–1.00)
GFR calc Af Amer: 27 mL/min — ABNORMAL LOW (ref >60)
GFR calc non Af Amer: 23 mL/min — ABNORMAL LOW (ref >60)
GLUCOSE: 147 mg/dL — AB (ref 65–99)
Potassium: 4.3 mmol/L (ref 3.5–5.1)
Sodium: 141 mmol/L (ref 135–145)
TOTAL PROTEIN: 7.7 g/dL (ref 6.5–8.1)
Total Bilirubin: 0.7 mg/dL (ref 0.3–1.2)

## 2017-06-13 LAB — CBC WITH DIFFERENTIAL/PLATELET
BASOS PCT: 0 %
Basophils Absolute: 0 10*3/uL (ref 0.0–0.1)
EOS PCT: 0 %
Eosinophils Absolute: 0 10*3/uL (ref 0.0–0.7)
HCT: 36.8 % (ref 36.0–46.0)
HEMOGLOBIN: 11.6 g/dL — AB (ref 12.0–15.0)
LYMPHS PCT: 3 %
Lymphs Abs: 0.7 10*3/uL (ref 0.7–4.0)
MCH: 26.5 pg (ref 26.0–34.0)
MCHC: 31.5 g/dL (ref 30.0–36.0)
MCV: 84.2 fL (ref 78.0–100.0)
MONOS PCT: 4 %
Monocytes Absolute: 0.9 10*3/uL (ref 0.1–1.0)
NEUTROS ABS: 21.9 10*3/uL — AB (ref 1.7–7.7)
Neutrophils Relative %: 93 %
Platelets: 378 10*3/uL (ref 150–400)
RBC: 4.37 MIL/uL (ref 3.87–5.11)
RDW: 17.5 % — ABNORMAL HIGH (ref 11.5–15.5)
WBC: 23.5 10*3/uL — ABNORMAL HIGH (ref 4.0–10.5)

## 2017-06-13 LAB — I-STAT CHEM 8, ED
BUN: 27 mg/dL — AB (ref 6–20)
CALCIUM ION: 1.05 mmol/L — AB (ref 1.15–1.40)
CHLORIDE: 110 mmol/L (ref 101–111)
CREATININE: 1.7 mg/dL — AB (ref 0.44–1.00)
GLUCOSE: 156 mg/dL — AB (ref 65–99)
HCT: 37 % (ref 36.0–46.0)
Hemoglobin: 12.6 g/dL (ref 12.0–15.0)
Potassium: 4.2 mmol/L (ref 3.5–5.1)
Sodium: 144 mmol/L (ref 135–145)
TCO2: 20 mmol/L — ABNORMAL LOW (ref 22–32)

## 2017-06-13 LAB — BLOOD GAS, ARTERIAL
ACID-BASE DEFICIT: 5.1 mmol/L — AB (ref 0.0–2.0)
BICARBONATE: 20.8 mmol/L (ref 20.0–28.0)
DRAWN BY: 51155
Delivery systems: POSITIVE
Expiratory PAP: 4
FIO2: 50
Inspiratory PAP: 10
O2 SAT: 96.8 %
PATIENT TEMPERATURE: 97.9
PO2 ART: 99.4 mmHg (ref 83.0–108.0)
pCO2 arterial: 46.7 mmHg (ref 32.0–48.0)
pH, Arterial: 7.268 — ABNORMAL LOW (ref 7.350–7.450)

## 2017-06-13 LAB — I-STAT TROPONIN, ED: Troponin i, poc: 0.18 ng/mL (ref 0.00–0.08)

## 2017-06-13 LAB — BRAIN NATRIURETIC PEPTIDE: B NATRIURETIC PEPTIDE 5: 1028.4 pg/mL — AB (ref 0.0–100.0)

## 2017-06-13 LAB — LACTIC ACID, PLASMA
Lactic Acid, Venous: 3.1 mmol/L (ref 0.5–1.9)
Lactic Acid, Venous: 1.5 mmol/L (ref 0.5–1.9)

## 2017-06-13 LAB — INFLUENZA PANEL BY PCR (TYPE A & B)
INFLBPCR: NEGATIVE
Influenza A By PCR: POSITIVE — AB

## 2017-06-13 LAB — I-STAT CG4 LACTIC ACID, ED
LACTIC ACID, VENOUS: 4.17 mmol/L — AB (ref 0.5–1.9)
Lactic Acid, Venous: 4.34 mmol/L (ref 0.5–1.9)

## 2017-06-13 LAB — PROCALCITONIN: Procalcitonin: 3.86 ng/mL

## 2017-06-13 MED ORDER — DILTIAZEM LOAD VIA INFUSION
15.0000 mg | Freq: Once | INTRAVENOUS | Status: AC
Start: 2017-06-13 — End: 2017-06-13
  Administered 2017-06-13: 15 mg via INTRAVENOUS
  Filled 2017-06-13: qty 15

## 2017-06-13 MED ORDER — DILTIAZEM HCL 25 MG/5ML IV SOLN
15.0000 mg | Freq: Once | INTRAVENOUS | Status: AC
  Administered 2017-06-13: 15 mg via INTRAVENOUS

## 2017-06-13 MED ORDER — ACETAMINOPHEN 650 MG RE SUPP: 650.0000 mg | Freq: Four times a day (QID) | RECTAL | Status: DC | PRN

## 2017-06-13 MED ORDER — ENOXAPARIN SODIUM 30 MG/0.3ML ~~LOC~~ SOLN
30.0000 mg | SUBCUTANEOUS | Status: DC
  Administered 2017-06-13 – 2017-06-15 (×3): 30 mg via SUBCUTANEOUS
  Filled 2017-06-13 (×4): qty 0.3

## 2017-06-13 MED ORDER — SODIUM CHLORIDE 0.9 % IV SOLN
INTRAVENOUS | Status: DC
  Administered 2017-06-13 – 2017-06-14 (×2): via INTRAVENOUS

## 2017-06-13 MED ORDER — TRAZODONE HCL 50 MG PO TABS: 25.0000 mg | ORAL_TABLET | Freq: Two times a day (BID) | ORAL | Status: DC

## 2017-06-13 MED ORDER — DIGOXIN 0.25 MG/ML IJ SOLN
0.1250 mg | Freq: Once | INTRAMUSCULAR | Status: AC
  Administered 2017-06-13: 0.125 mg via INTRAVENOUS
  Filled 2017-06-13: qty 2

## 2017-06-13 MED ORDER — ACETAMINOPHEN 325 MG PO TABS: 650.0000 mg | ORAL_TABLET | Freq: Four times a day (QID) | ORAL | Status: DC | PRN

## 2017-06-13 MED ORDER — MORPHINE SULFATE (PF) 4 MG/ML IV SOLN
0.5000 mg | INTRAVENOUS | Status: DC | PRN
  Administered 2017-06-14: 0.52 mg via INTRAVENOUS
  Filled 2017-06-13: qty 1

## 2017-06-13 MED ORDER — ONDANSETRON HCL 4 MG/2ML IJ SOLN: 4.0000 mg | Freq: Four times a day (QID) | INTRAMUSCULAR | Status: DC | PRN

## 2017-06-13 MED ORDER — ONDANSETRON HCL 4 MG PO TABS: 4.0000 mg | ORAL_TABLET | Freq: Four times a day (QID) | ORAL | Status: DC | PRN

## 2017-06-13 MED ORDER — SODIUM CHLORIDE 0.9 % IV BOLUS (SEPSIS)
1000.0000 mL | Freq: Once | INTRAVENOUS | Status: AC
  Administered 2017-06-13: 1000 mL via INTRAVENOUS

## 2017-06-13 MED ORDER — METHYLPREDNISOLONE SODIUM SUCC 40 MG IJ SOLR
40.0000 mg | Freq: Two times a day (BID) | INTRAMUSCULAR | Status: DC
  Administered 2017-06-13 – 2017-06-14 (×2): 40 mg via INTRAVENOUS
  Filled 2017-06-13 (×2): qty 1

## 2017-06-13 MED ORDER — LEVALBUTEROL HCL 0.63 MG/3ML IN NEBU: 0.6300 mg | INHALATION_SOLUTION | Freq: Four times a day (QID) | RESPIRATORY_TRACT | Status: DC | PRN

## 2017-06-13 MED ORDER — OSELTAMIVIR PHOSPHATE 75 MG PO CAPS: 75.0000 mg | ORAL_CAPSULE | Freq: Two times a day (BID) | ORAL | Status: DC

## 2017-06-13 MED ORDER — DEXTROSE 5 % IV SOLN
2.0000 g | Freq: Once | INTRAVENOUS | Status: AC
  Administered 2017-06-13: 2 g via INTRAVENOUS
  Filled 2017-06-13: qty 2

## 2017-06-13 MED ORDER — DEXTROSE 5 % IV SOLN
1.0000 g | INTRAVENOUS | Status: DC
  Administered 2017-06-14: 1 g via INTRAVENOUS
  Filled 2017-06-13 (×2): qty 1

## 2017-06-13 MED ORDER — PANTOPRAZOLE SODIUM 40 MG IV SOLR
40.0000 mg | INTRAVENOUS | Status: DC
  Administered 2017-06-13 – 2017-06-17 (×5): 40 mg via INTRAVENOUS
  Filled 2017-06-13 (×4): qty 40

## 2017-06-13 MED ORDER — METOPROLOL TARTRATE 5 MG/5ML IV SOLN
5.0000 mg | Freq: Once | INTRAVENOUS | Status: AC | PRN
  Administered 2017-06-15: 5 mg via INTRAVENOUS
  Filled 2017-06-13: qty 5

## 2017-06-13 MED ORDER — OSELTAMIVIR PHOSPHATE 30 MG PO CAPS
30.0000 mg | ORAL_CAPSULE | Freq: Every day | ORAL | Status: DC
  Administered 2017-06-14: 30 mg via ORAL
  Filled 2017-06-13 (×2): qty 1

## 2017-06-13 MED ORDER — VANCOMYCIN HCL IN DEXTROSE 750-5 MG/150ML-% IV SOLN: 750.0000 mg | INTRAVENOUS | Status: DC

## 2017-06-13 MED ORDER — METOPROLOL TARTRATE 25 MG PO TABS
25.0000 mg | ORAL_TABLET | Freq: Two times a day (BID) | ORAL | Status: DC
  Administered 2017-06-14: 25 mg via ORAL
  Filled 2017-06-13 (×2): qty 1

## 2017-06-13 MED ORDER — VANCOMYCIN HCL IN DEXTROSE 1-5 GM/200ML-% IV SOLN
1000.0000 mg | Freq: Once | INTRAVENOUS | Status: AC
  Administered 2017-06-13: 1000 mg via INTRAVENOUS
  Filled 2017-06-13: qty 200

## 2017-06-13 MED ORDER — DOXYCYCLINE HYCLATE 100 MG IV SOLR
100.0000 mg | Freq: Two times a day (BID) | INTRAVENOUS | Status: DC
  Administered 2017-06-13 – 2017-06-16 (×6): 100 mg via INTRAVENOUS
  Filled 2017-06-13 (×6): qty 100

## 2017-06-13 MED ORDER — ACETAMINOPHEN 10 MG/ML IV SOLN
1000.0000 mg | Freq: Four times a day (QID) | INTRAVENOUS | Status: AC | PRN
  Administered 2017-06-13: 1000 mg via INTRAVENOUS
  Filled 2017-06-13: qty 100

## 2017-06-13 MED ORDER — DILTIAZEM HCL-DEXTROSE 100-5 MG/100ML-% IV SOLN (PREMIX)
5.0000 mg/h | INTRAVENOUS | Status: DC
  Administered 2017-06-13: 15 mg/h via INTRAVENOUS
  Administered 2017-06-13: 5 mg/h via INTRAVENOUS
  Administered 2017-06-13: 12.5 mg/h via INTRAVENOUS
  Administered 2017-06-14 (×4): 15 mg/h via INTRAVENOUS
  Administered 2017-06-15: 5 mg/h via INTRAVENOUS
  Administered 2017-06-15: 10 mg/h via INTRAVENOUS
  Filled 2017-06-13 (×7): qty 100

## 2017-06-13 NOTE — H&P (Signed)
History and Physical    Kenyon M Hyden JYN:[id number] DOB: [birth year] DOA: 07/08/2017  PCP: Eulas Post, MD  Patient coming from: home   I have personally briefly reviewed patient's old medical records in [place]  Chief Complaint: SOB   HPI: Dominique Perez is a 84 y.o. female with medical history significant of A fib, CAD S/P PCI 2007, HTN, uterine cancer, who presents with SOB, in a fib and respiratory distress. Most history was obtain from family who is at bedside. Patient develops a productive cough a week ago.  On Friday she was ill, felt sick. Today she was more SOB.  Patient is able to say few words, she is sleepy. She denies chest pain, report SOB, and cough./   ED Course: patient presents with A fib RVR, febrile tempeture 101, WBC 23, Cr 1.8, BNP 1028, Lactic acid 4.5, chest x ray: Right lower lobe airspace disease concerning for atelectasis versus pneumonia. Bilateral interstitial thickening concerning for underlying mild interstitial edema.   Review of Systems: As per HPI otherwise 10 point review of systems negative.    Past Medical History:  Diagnosis Date  . Arthritis   . Coronary artery disease    a. 1999 s/p PCI/BMS of the RCA; b. 10/2005 PCI: LM nl, LAD min irregs, D1 90ost (2.5 x 23 Cypher DES), D2 40-50, LCX small, min irregs, RCA patent stents, otw min irregs, EF 60%.  . Dementia   . Fracture of right femur ([place])   . Gallstones   . GERD (gastroesophageal reflux disease)   . History of echocardiogram    a. 02/2011 Echo: EF 55-60%, mildly dil LA, mild to mod TR, PASP 44mmHg.  Marland Kitchen Hyperlipidemia   . Hypertension   . Leg pain, right    chronic  . Lymphedema    a. chronic bilat LEE -> improved with compression stockings.  Marland Kitchen PAF (paroxysmal atrial fibrillation) ([place])    a. CHA2DS2VASc = 5-->prev on xarelto->d/c'd in 02/2017 2/2 falls and progressive dementia. On Multaq.  . Tremor    chronic  . Uterus cancer ([place]) 10/2016   Dr. Dellis Filbert    Past  Surgical History:  Procedure Laterality Date  . CARDIAC CATHETERIZATION  1999    PCI to RCA  . CARDIAC CATHETERIZATION  2007   PCI to diagonal  . CARDIAC CATHETERIZATION  2002   treated medically  . CATARACT EXTRACTION    . femoral pseudoaneurysm  1999   repair  . KNEE SURGERY     right, titanium rod in the right femur  . left knee  2009   replacement by Dr. Wynelle Link  . TOTAL HIP ARTHROPLASTY     Dr. Durward Fortes     reports that  has never smoked. she has never used smokeless tobacco. She reports that she does not drink alcohol or use drugs.  Allergies  Allergen Reactions  . Amiodarone Other (See Comments)    tremor  . Erythromycin Stearate Other (See Comments)    REACTION: fever  . Ferrous Sulfate Other (See Comments)    REACTION: fever  . Iohexol Other (See Comments)     Code: HIVES, Desc: Pt states that 10 yrs ago she IV contrast for a CT and broke out in hives and experienced SOB.  Done w/o iv contrast today., Onset Date: [id number]   . Sulfa Antibiotics Hives    Family History  Problem Relation Age of Onset  . Heart attack Mother   . Cancer Father  Prior to Admission medications   Medication Sig Start Date End Date Taking? Authorizing Provider  aspirin EC 81 MG tablet Take 1 tablet (81 mg total) by mouth daily. 03/03/17  Yes Meng, Isaac Laud, PA  dronedarone (MULTAQ) 400 MG tablet TAKE 1 BY MOUTH TWICE DAILY WITH A MEAL 07/02/16  Yes [place], Peter M, MD  furosemide (LASIX) 20 MG tablet Take 1 tablet (20 mg total) by mouth daily. 05/12/17  Yes Burchette, Alinda Sierras, MD  isosorbide mononitrate (IMDUR) 60 MG 24 hr tablet TAKE 1 TABLET (60 MG TOTAL) BY MOUTH DAILY. 07/14/16  Yes [place], Peter M, MD  megestrol (MEGACE) 40 MG tablet Take 1 tablet (40 mg total) by mouth 2 (two) times daily. 12/11/16  Yes Princess Bruins, MD  metoprolol tartrate (LOPRESSOR) 25 MG tablet Take 1 tablet (25 mg total) by mouth 2 (two) times daily. 07/02/16  Yes [place], Peter M, MD  simvastatin (ZOCOR) 20  MG tablet Take 0.5 tablets (10 mg total) by mouth daily at 6 PM. 07/02/16  Yes [place], Peter M, MD  traZODone (DESYREL) 50 MG tablet Take 25 mg by mouth 2 (two) times daily.   Yes [provider]  lansoprazole (PREVACID) 30 MG capsule Take 1 capsule (30 mg total) by mouth daily at 12 noon. Patient taking differently: Take 30 mg by mouth. Patient takes in evening with meal 07/02/16   [place], Peter M, MD  traMADol-acetaminophen (ULTRACET) 37.5-325 MG tablet Take 1 tablet by mouth every 6 (six) hours. Patient not taking: Reported on 06/15/2017 03/12/17   Eulas Post, MD    Physical Exam: Vitals:   06/17/2017 1600 06/11/2017 1630 06/14/2017 1645 07/05/2017 1700  BP: 109/86 112/78 117/79 121/75  Pulse: 82 (!) 162 86 (!) 154  Resp: (!) 39 (!) 37 (!) 39 (!) 41  Temp:      TempSrc:      SpO2: 91% 91% 92% 92%  Weight:      Height:        Constitutional: sleepy, V mask.  Vitals:   06/28/2017 1600 06/25/2017 1630 06/15/2017 1645 06/11/2017 1700  BP: 109/86 112/78 117/79 121/75  Pulse: 82 (!) 162 86 (!) 154  Resp: (!) 39 (!) 37 (!) 39 (!) 41  Temp:      TempSrc:      SpO2: 91% 91% 92% 92%  Weight:      Height:       Eyes: PERRL, lids and conjunctivae normal ENMT: Mucous membranes are moist. Posterior pharynx clear of any exudate or lesions.Normal dentition.  Neck: normal, supple, no masses, no thyromegaly Respiratory: Bilateral ronchus.  Use of  accessory muscle use.  Cardiovascular: Irregular rate and rhythm, no murmurs / rubs / gallops. trace extremity edema. 2+ pedal pulses. No carotid bruits.  Abdomen: no tenderness, no masses palpated. No hepatosplenomegaly. Bowel sounds positive.  Musculoskeletal: no clubbing / cyanosis. No joint deformity upper and lower extremities.  Skin: no rashes, lesions, ulcers. No induration Neurologic: sleepy, open eyes, follows some command, answer few questions. She was able to raise both upper extremities.  Psychiatric: unable to evaluate   Labs on  Admission: I have personally reviewed following labs and imaging studies  CBC: Recent Labs  Lab 07/03/2017 1259 06/15/2017 1327  WBC 23.5*  --   NEUTROABS 21.9*  --   HGB 11.6* 12.6  HCT 36.8 37.0  MCV 84.2  --   PLT 378  --    Basic Metabolic Panel: Recent Labs  Lab 06/17/2017 1259 06/21/2017 1327  NA 141 144  K 4.3 4.2  CL 107 110  CO2 18*  --   GLUCOSE 147* 156*  BUN 27* 27*  CREATININE 1.85* 1.70*  CALCIUM 8.5*  --    GFR: Estimated Creatinine Clearance: 18.9 mL/min (A) (by C-G formula based on SCr of 1.7 mg/dL (H)). Liver Function Tests: Recent Labs  Lab 07/02/2017 1259  AST 36  ALT 15  ALKPHOS 105  BILITOT 0.7  PROT 7.7  ALBUMIN 2.4*   No results for input(s): LIPASE, AMYLASE in the last 168 hours. No results for input(s): AMMONIA in the last 168 hours. Coagulation Profile: No results for input(s): INR, PROTIME in the last 168 hours. Cardiac Enzymes: No results for input(s): CKTOTAL, CKMB, CKMBINDEX, TROPONINI in the last 168 hours. BNP (last 3 results) No results for input(s): PROBNP in the last 8760 hours. HbA1C: No results for input(s): HGBA1C in the last 72 hours. CBG: No results for input(s): GLUCAP in the last 168 hours. Lipid Profile: No results for input(s): CHOL, HDL, LDLCALC, TRIG, CHOLHDL, LDLDIRECT in the last 72 hours. Thyroid Function Tests: No results for input(s): TSH, T4TOTAL, FREET4, T3FREE, THYROIDAB in the last 72 hours. Anemia Panel: No results for input(s): VITAMINB12, FOLATE, FERRITIN, TIBC, IRON, RETICCTPCT in the last 72 hours. Urine analysis:    Component Value Date/Time   COLORURINE YELLOW 12/17/2015 [place] 12/17/2015 0517   LABSPEC 1.010 12/17/2015 0517   PHURINE 5.0 12/17/2015 0517   GLUCOSEU NEGATIVE 12/17/2015 0517   HGBUR SMALL (A) 12/17/2015 0517   BILIRUBINUR neg 11/20/2016 1459   KETONESUR 15 (A) 12/17/2015 0517   PROTEINUR neg 11/20/2016 1459   PROTEINUR NEGATIVE 12/17/2015 0517   UROBILINOGEN  0.2 11/20/2016 1459   UROBILINOGEN 0.2 04/21/2014 1050   NITRITE neg 11/20/2016 1459   NITRITE NEGATIVE 12/17/2015 0517   LEUKOCYTESUR Negative 11/20/2016 1459    Radiological Exams on Admission: Dg Chest Port 1 View  Result Date: 07/01/2017 CLINICAL DATA:  Shortness of breath EXAM: PORTABLE CHEST 1 VIEW COMPARISON:  12/17/2015 FINDINGS: There is bilateral interstitial thickening. There is right lower lobe airspace disease. Bilateral costophrenic angles are excluded from the field of view. There prominence of the central pulmonary vasculature. The heart mediastinum are stable. There is no acute osseous abnormality. There is moderate osteoarthritis of bilateral glenohumeral joints. IMPRESSION: Right lower lobe airspace disease concerning for atelectasis versus pneumonia. Bilateral interstitial thickening concerning for underlying mild interstitial edema. Electronically Signed   By: Kathreen Devoid   On: 06/26/2017 14:06    EKG: Independently reviewed. A fib monitor   Assessment/Plan Active Problems:   PAF (paroxysmal atrial fibrillation) (H[facility])   Hypertension   CKD (chronic kidney disease) stage 3, GFR 30-59 ml/min (H[facility])   Sepsis ([facility])   Healthcare-associated pneumonia   1-Acute hypoxic respiratory failure, respiratory Distress; PNA- Health care associated.  Related to PNA, A fib.  Discussed with family will try BIPAP (if patient able to tolerates it)  for increase work of breathing, no intubation.  Daughter was ok with Morphine PRN for increase work of breathing, she wants to make sure her mother is comfortable also.  Will continue with IV antibiotics.  Xopenex PRN.  IV solumedrol.   2-Sepsis; in setting of PNA.  Patient present with fever, leukocytosis, lactic acidosis.  Admit to step down unit.  She has received 2 L IV bolus in the ED.  Continue with IV fluids.  IV vancomycin, cefepime, Doxy (for atypical coverage).  Check for influenza. Start tamiflu  for now,  Check UA, culture.     2-A fib RVR;  Started on Cardizem Gtt.  Cardiology consulted. Will try digoxin.  Resume metoprolol.   4-AKI on chronic renal failure stage III;  Cr at 1.5 earlier this month, 1.1 ten month ago.  Suspect related to sepsis, hypoperfusion.  IV fluids.   5-GERD; IV Protonix.    DVT prophylaxis: lovenox Code Status: DNR, ok to try BIPAP/ No Central line, no IV pressors, ok to use low dose of morphine for respiratory distress if needed. Discussed with Daughter  Family Communication: Daughters at bedside.  Disposition Plan: to be determine, poor prognosis, will see if she respond to intervention next 24 to 48 hours.  Consults called: cardiology  Admission status: Inpatient, step down unit    Elmarie Shiley MD [facility] Pager [phone]  If 7PM-7AM, please contact night-coverage [email or web address] Password [facility]  06/16/2017, 5:19 PM

## 2017-06-13 NOTE — ED Provider Notes (Addendum)
[facility] EMERGENCY DEPARTMENT Provider Note   CSN: [id number] Arrival date & time: 06/17/2017  1238     History   Chief Complaint No chief complaint on file.   HPI Dominique Perez is a 84 y.o. female.  If complaint is fever, shortness of breath, lethargy  HPI Dominique Perez is an 84 year old female.  She lived independently until 4 weeks ago.  She went to a assisted living/skilled nursing facility.  She has history of atrial fibrillation, on Multitak, and metoprolol.  She is not anticoagulated.  History of hypercholesterolemia, dependent edema.  She had a cold for a week with a cough.  More short of breath yesterday and today.  Fever and lethargy this morning.  She was transferred via EMS for evaluation.  She has a [place] form and is DNR.  This is reiterated by her daughter.  They would like admission, treatment, but no central lines, or intubation..  Past Medical History:  Diagnosis Date  . Arthritis   . Atrial fibrillation ([place])   . Atrial flutter ([place])   . Coronary artery disease    s/p pci 1999 rca, 2007 diagonal  . Fracture of right femur ([place])   . Gallstones   . GERD (gastroesophageal reflux disease)   . Hyperlipidemia   . Hypertension   . Leg pain, right    chronic  . Tremor    chronic  . Uterus cancer ([place]) 10/2016   Dr. Dellis Filbert    Patient Active Problem List   Diagnosis Date Noted  . Dementia 02/08/2017  . Memory loss 03/23/2016  . Atrial fibrillation with rapid ventricular response ([place]) 12/17/2015  . Atrial fibrillation with RVR ([place]) 12/17/2015  . Atrial flutter ([place]) 04/13/2014  . Chronic anticoagulation 03/27/2014  . Osteoarthritis of multiple joints 09/04/2011  . CKD (chronic kidney disease) stage 3, GFR 30-59 ml/min ([facility]) 09/04/2011  . Bradycardia 02/17/2011  . PAF (paroxysmal atrial fibrillation) ([place])   . Hypertension   . Leg pain, right   . Arthritis   . GERD (gastroesophageal reflux disease)   . Hyperlipidemia   . Gallstones   .  ASTEATOTIC ECZEMA 06/18/2009  . CAD S/P percutaneous coronary angioplasty 12/20/2008  . ASTHMA 12/20/2008  . GERD 12/20/2008  . DECUBITUS ULCER, BUTTOCK 12/20/2008  . COUGH, CHRONIC 12/20/2008    Past Surgical History:  Procedure Laterality Date  . CARDIAC CATHETERIZATION  1999    PCI to RCA  . CARDIAC CATHETERIZATION  2007   PCI to diagonal  . CARDIAC CATHETERIZATION  2002   treated medically  . CATARACT EXTRACTION    . femoral pseudoaneurysm  1999   repair  . KNEE SURGERY     right, titanium rod in the right femur  . left knee  2009   replacement by Dr. Wynelle Link  . TOTAL HIP ARTHROPLASTY     Dr. Durward Fortes    OB History    No data available       Home Medications    Prior to Admission medications   Medication Sig Start Date End Date Taking? Authorizing Provider  aspirin EC 81 MG tablet Take 1 tablet (81 mg total) by mouth daily. 03/03/17  Yes Meng, Isaac Laud, PA  dronedarone (MULTAQ) 400 MG tablet TAKE 1 BY MOUTH TWICE DAILY WITH A MEAL 07/02/16  Yes [place], Peter M, MD  furosemide (LASIX) 20 MG tablet Take 1 tablet (20 mg total) by mouth daily. 05/12/17  Yes Burchette, Alinda Sierras, MD  isosorbide mononitrate (IMDUR) 60 MG 24 hr  tablet TAKE 1 TABLET (60 MG TOTAL) BY MOUTH DAILY. 07/14/16  Yes [place], Peter M, MD  megestrol (MEGACE) 40 MG tablet Take 1 tablet (40 mg total) by mouth 2 (two) times daily. 12/11/16  Yes Princess Bruins, MD  metoprolol tartrate (LOPRESSOR) 25 MG tablet Take 1 tablet (25 mg total) by mouth 2 (two) times daily. 07/02/16  Yes [place], Peter M, MD  simvastatin (ZOCOR) 20 MG tablet Take 0.5 tablets (10 mg total) by mouth daily at 6 PM. 07/02/16  Yes [place], Peter M, MD  traZODone (DESYREL) 50 MG tablet Take 25 mg by mouth 2 (two) times daily.   Yes [provider]  lansoprazole (PREVACID) 30 MG capsule Take 1 capsule (30 mg total) by mouth daily at 12 noon. Patient taking differently: Take 30 mg by mouth. Patient takes in evening with meal 07/02/16    [place], Peter M, MD  traMADol-acetaminophen (ULTRACET) 37.5-325 MG tablet Take 1 tablet by mouth every 6 (six) hours. Patient not taking: Reported on 06/12/2017 03/12/17   Eulas Post, MD    Family History Family History  Problem Relation Age of Onset  . Heart attack Mother   . Cancer Father     Social History Social History   Tobacco Use  . Smoking status: Never Smoker  . Smokeless tobacco: Never Used  Substance Use Topics  . Alcohol use: No  . Drug use: No     Allergies   Amiodarone; Erythromycin stearate; Ferrous sulfate; Iohexol; and Sulfa antibiotics   Review of Systems Review of Systems  Unable to perform ROS: Acuity of condition     Physical Exam Updated Vital Signs BP 137/79   Pulse 93   Temp (!) 101.2 F (38.4 C) (Rectal)   Resp (!) 40   Ht 5\' 3"  (1.6 m)   Wt 59 kg (130 lb)   SpO2 91%   BMI 23.03 kg/m   Physical Exam  Constitutional: She appears well-developed and well-nourished. She appears distressed.  Tachycardic tachypneic.  Eyes are closed.  Will awaken with verbal or tactile stimulus.  He is able to answer simple yes or no questioning.  Easily confused with any advanced questioning.  HENT:  Head: Normocephalic.  His membranes are moist.  Conjunctivae not pale.  Eyes: Conjunctivae are normal. Pupils are equal, round, and reactive to light. No scleral icterus.  Symmetric reactive pupils.  Neck: Normal range of motion. Neck supple. No thyromegaly present.  Cardiovascular: An irregular rhythm present. Tachycardia present. Exam reveals no gallop and no friction rub.  No murmur heard. Pulmonary/Chest: Effort normal and breath sounds normal. No respiratory distress. She has no wheezes. She has no rales.  Bibasilar rhonchi right greater than left.  No prolongation or wheezing.  Abdominal: Soft. Bowel sounds are normal. She exhibits no distension. There is no tenderness. There is no rebound.  Neurological:  In all 4 extremities.  Skin: Skin is  dry. No rash noted.  Well perfused.  Good capillary refill.     ED Treatments / Results  Labs (all labs ordered are listed, but only abnormal results are displayed) Labs Reviewed  CBC WITH DIFFERENTIAL/PLATELET - Abnormal; Notable for the following components:      Result Value   WBC 23.5 (*)    Hemoglobin 11.6 (*)    RDW 17.5 (*)    Neutro Abs 21.9 (*)    All other components within normal limits  COMPREHENSIVE METABOLIC PANEL - Abnormal; Notable for the following components:   CO2 18 (*)  Glucose, Bld 147 (*)    BUN 27 (*)    Creatinine, Ser 1.85 (*)    Calcium 8.5 (*)    Albumin 2.4 (*)    GFR calc non Af Amer 23 (*)    GFR calc Af Amer 27 (*)    Anion gap 16 (*)    All other components within normal limits  BRAIN NATRIURETIC PEPTIDE - Abnormal; Notable for the following components:   B Natriuretic Peptide 1,028.4 (*)    All other components within normal limits  I-STAT TROPONIN, ED - Abnormal; Notable for the following components:   Troponin i, poc 0.18 (*)    All other components within normal limits  I-STAT CHEM 8, ED - Abnormal; Notable for the following components:   BUN 27 (*)    Creatinine, Ser 1.70 (*)    Glucose, Bld 156 (*)    Calcium, Ion 1.05 (*)    TCO2 20 (*)    All other components within normal limits  I-STAT CG4 LACTIC ACID, ED - Abnormal; Notable for the following components:   Lactic Acid, Venous 4.17 (*)    All other components within normal limits  I-STAT CG4 LACTIC ACID, ED - Abnormal; Notable for the following components:   Lactic Acid, Venous 4.34 (*)    All other components within normal limits  URINE CULTURE  URINALYSIS, ROUTINE W REFLEX MICROSCOPIC  I-STAT CG4 LACTIC ACID, ED    EKG  EKG Interpretation  Date/Time:  Sunday June 13 2017 12:48:30 EST Ventricular Rate:  180 PR Interval:    QRS Duration: 76 QT Interval:  252 QTC Calculation: 436 R Axis:   -12 Text Interpretation:  Atrial fibrillation with rapid V-rate  Anteroseptal infarct, old Nonspecific T abnormalities, lateral leads Since last EKG, afib has replaced sinus rhythm PVCs are now evident Diffuse St-t changes likely rate-related Confirmed by Duffy Bruce [id number]) on 06/19/2017 3:32:57 PM       Radiology Dg Chest Port 1 View  Result Date: 07/01/2017 CLINICAL DATA:  Shortness of breath EXAM: PORTABLE CHEST 1 VIEW COMPARISON:  12/17/2015 FINDINGS: There is bilateral interstitial thickening. There is right lower lobe airspace disease. Bilateral costophrenic angles are excluded from the field of view. There prominence of the central pulmonary vasculature. The heart mediastinum are stable. There is no acute osseous abnormality. There is moderate osteoarthritis of bilateral glenohumeral joints. IMPRESSION: Right lower lobe airspace disease concerning for atelectasis versus pneumonia. Bilateral interstitial thickening concerning for underlying mild interstitial edema. Electronically Signed   By: Kathreen Devoid   On: 06/25/2017 14:06    Procedures Procedures (including critical care time)  Medications Ordered in ED Medications  diltiazem (CARDIZEM) 1 mg/mL load via infusion 15 mg (15 mg Intravenous Bolus from Bag 06/22/2017 1323)    And  diltiazem (CARDIZEM) 100 mg in dextrose 5% 158mL (1 mg/mL) infusion (15 mg/hr Intravenous Rate/Dose Change 06/24/2017 1558)  ceFEPIme (MAXIPIME) 2 g in dextrose 5 % 50 mL IVPB (2 g Intravenous New Bag/Given 06/12/2017 1554)  ceFEPIme (MAXIPIME) 1 g in dextrose 5 % 50 mL IVPB (not administered)  vancomycin (VANCOCIN) IVPB 750 mg/150 ml premix (not administered)  sodium chloride 0.9 % bolus 1,000 mL (0 mLs Intravenous Stopped 06/17/2017 1503)    And  sodium chloride 0.9 % bolus 1,000 mL (0 mLs Intravenous Stopped 07/04/2017 1550)  vancomycin (VANCOCIN) IVPB 1000 mg/200 mL premix (0 mg Intravenous Stopped 07/05/2017 1552)  diltiazem (CARDIZEM) injection 15 mg (15 mg Intravenous Given 07/02/2017 1550)  Initial Impression / Assessment and Plan  / ED Course  I have reviewed the triage vital signs and the nursing notes.  Pertinent labs & imaging results that were available during my care of the patient were reviewed by me and considered in my medical decision making (see chart for details).   X-ray shows right lower lobe pneumonia.  She is in rapid A. fib.  Clinically not in CHF.  No JVD.  No edema.  Lactic acid elevated.  Given antibiotic for age With vancomycin and Maxipime.  Will give sepsis fluids, 2 L.  Continued on high flow O2.  Cardizem bolus and drip infusing.  Rectal temp 101.  Influenza swab pending.  Given Tylenol PR.  KG shows A. fib with RVR but no obvious injury currents.  Troponin mildly elevated 0.18 likely demand ischemia.  She will open her eyes and states that she is not having chest pain currently.  Will discuss with hospitalist.  Daughter arrives.  Confirms that she is DNR and confirms her wishes for treatment as above.  Currently undergoing additional fluids, we bolus of Cardizem.  CRITICAL CARE Performed by: Lolita Patella   Total critical care time: 68 minutes  Reevaluation at 1430, and 1500 p.m.  Critical care time was exclusive of separately billable procedures and treating other patients.  Critical care was necessary to treat or prevent imminent or life-threatening deterioration.  Critical care was time spent personally by me on the following activities: development of treatment plan with patient and/or surrogate as well as nursing, discussions with consultants, evaluation of patient's response to treatment, examination of patient, obtaining history from patient or surrogate, ordering and performing treatments and interventions, ordering and review of laboratory studies, ordering and review of radiographic studies, pulse oximetry and re-evaluation of patient's condition.    16:21: At the request of hospitalist, consulted cardiology, Dr. Oval Linsey Re:  rate control for rapid A. fib.  Final Clinical  Impressions(s) / ED Diagnoses   Final diagnoses:  Sepsis, due to unspecified organism [facility])    ED Discharge Orders    None       Tanna Furry, MD 06/12/2017 1553    Tanna Furry, MD 06/19/2017 [phone]

## 2017-06-13 NOTE — ED Notes (Signed)
Dr.James shown the results of istat labs. ED-LAb

## 2017-06-13 NOTE — Progress Notes (Signed)
Patient transported on [facility] from ED to 3M12. Patient's vitals remained stable throughout.

## 2017-06-13 NOTE — Progress Notes (Signed)
Pharmacy Antibiotic Note  Dominique Perez is a 84 y.o. female admitted on 06/21/2017 with sepsis.  Pharmacy has been consulted for vancomycin and zosyn dosing.  Given one time doses in the ED. SCr 1.7, CrCl ~18ml/min  Plan: Start cefepime 1g IV Q24h tomorrow Start vancomycin 750mg  IV Q48h on 2/5 Monitor clinical picture, renal function, VT prn F/U C&S, abx deescalation / LOT   Height: 5\' 3"  (160 cm) Weight: 130 lb (59 kg) IBW/kg (Calculated) : 52.4  Temp (24hrs), Avg:101.2 F (38.4 C), Min:101.2 F (38.4 C), Max:101.2 F (38.4 C)  Recent Labs  Lab 07/02/2017 1259 06/26/2017 1327 07/05/2017 1328  WBC 23.5*  --   --   CREATININE 1.85* 1.70*  --   LATICACIDVEN  --   --  4.17*    Estimated Creatinine Clearance: 18.9 mL/min (A) (by C-G formula based on SCr of 1.7 mg/dL (H)).    Allergies  Allergen Reactions  . Amiodarone Other (See Comments)    tremor  . Erythromycin Stearate Other (See Comments)    REACTION: fever  . Ferrous Sulfate Other (See Comments)    REACTION: fever  . Iohexol Other (See Comments)     Code: HIVES, Desc: Pt states that 10 yrs ago she IV contrast for a CT and broke out in hives and experienced SOB.  Done w/o iv contrast today., Onset Date: [id number]   . Sulfa Antibiotics Hives    Thank you for allowing pharmacy to be a part of this patient's care.  Reginia Naas 06/29/2017 2:34 PM

## 2017-06-13 NOTE — ED Notes (Signed)
Blood cultures drawn by Lucina Mellow and sent to lab

## 2017-06-13 NOTE — ED Triage Notes (Signed)
Per EMS:  Pt from [facility] with c/o cough and congestion x 1week. Pt became sob today.  Upon EMS arrival pt found to be in A-Fib RVR with a HR of 180.  A/O per baseline.  Pt has a hx of dementia.  Prior to arrival pt was administered 10mg  albuterol, 0.5 Atrovent, and 125 Solumedrol. Vitals: 190/69, HR 180-190, Spo2 90% on 2L, CBG 145.

## 2017-06-13 NOTE — ED Notes (Signed)
CODE SEPSIS ACTIVATED RN CINDY AWARE

## 2017-06-13 NOTE — ED Notes (Signed)
Attempted report x1. 

## 2017-06-13 NOTE — Consult Note (Signed)
Cardiology Consult    Patient ID: Dominique Perez MRN: [id number], DOB/AGE: 07/19/1926   Admit date: 06/19/2017 Date of Consult: 06/14/2017  Primary Physician: Eulas Post, MD Primary Cardiologist: Peter Martinique, MD Requesting Provider: B. Regalado, MD  Patient Profile    Dominique Perez is a 84 y.o. female with a history of CAD s/p prior RCA and D1 stenting, PAF, HTN, HL, chronic lower ext edema, and progressive dementia, who is being seen today for the evaluation of AFib w/ RVR in the setting of resp failure and sepsis at the request of Dr. Tyrell Antonio.  Past Medical History   Past Medical History:  Diagnosis Date  . Arthritis   . Coronary artery disease    a. 1999 s/p PCI/BMS of the RCA; b. 10/2005 PCI: LM nl, LAD min irregs, D1 90ost (2.5 x 23 Cypher DES), D2 40-50, LCX small, min irregs, RCA patent stents, otw min irregs, EF 60%.  . Dementia   . Fracture of right femur ([place])   . Gallstones   . GERD (gastroesophageal reflux disease)   . History of echocardiogram    a. 02/2011 Echo: EF 55-60%, mildly dil LA, mild to mod TR, PASP 2mmHg.  Marland Kitchen Hyperlipidemia   . Hypertension   . Leg pain, right    chronic  . Lymphedema    a. chronic bilat LEE -> improved with compression stockings.  Marland Kitchen PAF (paroxysmal atrial fibrillation) ([place])    a. CHA2DS2VASc = 5-->prev on xarelto->d/c'd in 02/2017 2/2 falls and progressive dementia. On Multaq.  . Tremor    chronic  . Uterus cancer ([place]) 10/2016   Dr. Dellis Filbert    Past Surgical History:  Procedure Laterality Date  . CARDIAC CATHETERIZATION  1999    PCI to RCA  . CARDIAC CATHETERIZATION  2007   PCI to diagonal  . CARDIAC CATHETERIZATION  2002   treated medically  . CATARACT EXTRACTION    . femoral pseudoaneurysm  1999   repair  . KNEE SURGERY     right, titanium rod in the right femur  . left knee  2009   replacement by Dr. Wynelle Link  . TOTAL HIP ARTHROPLASTY     Dr. Durward Fortes     Allergies  Allergies  Allergen Reactions  .  Amiodarone Other (See Comments)    tremor  . Erythromycin Stearate Other (See Comments)    REACTION: fever  . Ferrous Sulfate Other (See Comments)    REACTION: fever  . Iohexol Other (See Comments)     Code: HIVES, Desc: Pt states that 10 yrs ago she IV contrast for a CT and broke out in hives and experienced SOB.  Done w/o iv contrast today., Onset Date: [id number]   . Sulfa Antibiotics Hives    History of Present Illness    84 y/o ? with the above complex past medical history including coronary artery disease status post bare-metal stenting of the right coronary artery in 1999 with subsequent drug-eluting stent placement to the first diagonal in [place] 2007.  Other history includes paroxysmal atrial fibrillation, hypertension, hyperlipidemia, GERD, chronic lower extremity edema, and progressive dementia.  From an A. fib standpoint, she has been managed with Multitaq and was previously anticoagulated with Xarelto in the setting of a CHA2DS2VASc of 5 however, in October 2018, her family expressed concern that she was living by herself, was likely to miss medications in the setting of progressive dementia, and was also prone to falling.  After discussion with Dr. Martinique, Xarelto was discontinued.  Shortly thereafter, patient was moved into a skilled nursing facility and earlier this year, she was moved to memory care.  Per family, she had been doing well physically and was not having any cardiac complaints but her dementia has progressed some.  Approximately 10 days ago, she developed a cough.  She was treated with OTC robitussin, however cough persisted.  As of Friday 2/1, she was noted by staff to be more lethargic and not wanting to get out of bed.  This worsened over the weekend, as did cough and resp status.  Her dtr went to visit her today and was informed of her declining health status and EMS was called.  Pt was found to be in AFib with RVR with rates into the 180's.  She was taken to [facility] ED  where she was febrile  101.2, with a wbc of 23.5 and a lactate of 4.34.  CXR shows RLL airspace dzs concerning for pna with interstitial edema.  We've been asked to eval. HRs currently 140's on IV dilt @ 15 mg/hr.  Pt is lethargic and unable to answer questions.  Family @ bedside and provided history.   Medications    Prior to Admission medications   Medication Sig Start Date End Date Taking? Authorizing Provider  aspirin EC 81 MG tablet Take 1 tablet (81 mg total) by mouth daily. 03/03/17  Yes Meng, Isaac Laud, PA  dronedarone (MULTAQ) 400 MG tablet TAKE 1 BY MOUTH TWICE DAILY WITH A MEAL 07/02/16  Yes Martinique, Peter M, MD  furosemide (LASIX) 20 MG tablet Take 1 tablet (20 mg total) by mouth daily. 05/12/17  Yes Burchette, Alinda Sierras, MD  isosorbide mononitrate (IMDUR) 60 MG 24 hr tablet TAKE 1 TABLET (60 MG TOTAL) BY MOUTH DAILY. 07/14/16  Yes Martinique, Peter M, MD  megestrol (MEGACE) 40 MG tablet Take 1 tablet (40 mg total) by mouth 2 (two) times daily. 12/11/16  Yes Princess Bruins, MD  metoprolol tartrate (LOPRESSOR) 25 MG tablet Take 1 tablet (25 mg total) by mouth 2 (two) times daily. 07/02/16  Yes Martinique, Peter M, MD  simvastatin (ZOCOR) 20 MG tablet Take 0.5 tablets (10 mg total) by mouth daily at 6 PM. 07/02/16  Yes Martinique, Peter M, MD  traZODone (DESYREL) 50 MG tablet Take 25 mg by mouth 2 (two) times daily.   Yes [provider]  lansoprazole (PREVACID) 30 MG capsule Take 1 capsule (30 mg total) by mouth daily at 12 noon. Patient taking differently: Take 30 mg by mouth. Patient takes in evening with meal 07/02/16   Martinique, Peter M, MD  traMADol-acetaminophen (ULTRACET) 37.5-325 MG tablet Take 1 tablet by mouth every 6 (six) hours. Patient not taking: Reported on 06/21/2017 03/12/17   Eulas Post, MD      Family History    Family History  Problem Relation Age of Onset  . Heart attack Mother   . Cancer Father    indicated that her mother is deceased. She indicated that her father is  deceased. She indicated that her maternal grandmother is deceased. She indicated that her maternal grandfather is deceased. She indicated that her paternal grandmother is deceased. She indicated that her paternal grandfather is deceased.   Social History    Social History   Socioeconomic History  . Marital status: Widowed    Spouse name: Not on file  . Number of children: 4  . Years of education: Not on file  . Highest education level: Not on file  Social Needs  .  Financial resource strain: Not on file  . Food insecurity - worry: Not on file  . Food insecurity - inability: Not on file  . Transportation needs - medical: Not on file  . Transportation needs - non-medical: Not on file  Occupational History  . Not on file  Tobacco Use  . Smoking status: Never Smoker  . Smokeless tobacco: Never Used  Substance and Sexual Activity  . Alcohol use: No  . Drug use: No  . Sexual activity: No  Other Topics Concern  . Not on file  Social History Narrative   Was living independently with family nearby until the Fall of 2018.  Now in [facility].     Review of Systems    Pt is lethargic and suffers from dementia.  She is not able to participate in interview or respond to questions related to review of systems.  Per family, she had been doing well until approximately 10 days ago when she developed a cough.  She has been more lethargic since February 1.  Her dementia has progressed over the past few months.  Physical Exam    Blood pressure 112/78, pulse (!) 162, temperature (!) 101.2 F (38.4 C), temperature source Rectal, resp. rate (!) 37, height 5\' 3"  (1.6 m), weight 130 lb (59 kg), SpO2 91 %.  General: Pleasant and in moderate distress.  Tachypneic with use of accessory muscles. Psych: Flat affect. Groans occasionally but not answering questions. Neuro: Disoriented to time/place. Moves all extremities spontaneously. HEENT: Normal  Neck: Supple without bruits or JVD. Lungs:  Labored respirations with use of accessory muscles.  Tachypneic.  Coarse breath sounds with rhonchi throughout anteriorly and posteriorly. Heart: Irregularly irregular, tachycardic, no s3, s4, or murmurs. Abdomen: Soft, diffusely tender to minimal palpation, non-distended, BS + x 4.  Extremities: No clubbing, cyanosis.  2+ bilateral lower extremity edema to the midcalf (family says this is much improved). DP/PT/Radials 1+ and equal bilaterally.  Labs    Troponin [facility] of Care Test) Recent Labs    06/23/2017 1324  TROPIPOC 0.18*    Lab Results  Component Value Date   WBC 23.5 (H) 07/07/2017   HGB 12.6 06/24/2017   HCT 37.0 07/02/2017   MCV 84.2 06/19/2017   PLT 378 07/02/2017    Recent Labs  Lab 06/15/2017 1259 07/02/2017 1327  NA 141 144  K 4.3 4.2  CL 107 110  CO2 18*  --   BUN 27* 27*  CREATININE 1.85* 1.70*  CALCIUM 8.5*  --   PROT 7.7  --   BILITOT 0.7  --   ALKPHOS 105  --   ALT 15  --   AST 36  --   GLUCOSE 147* 156*   Lab Results  Component Value Date   CHOL 162 07/01/2016   HDL 88 07/01/2016   LDLCALC 59 07/01/2016   TRIG 77 07/01/2016    Radiology Studies    Dg Chest Port 1 View  Result Date: 06/16/2017 CLINICAL DATA:  Shortness of breath EXAM: PORTABLE CHEST 1 VIEW COMPARISON:  12/17/2015 FINDINGS: There is bilateral interstitial thickening. There is right lower lobe airspace disease. Bilateral costophrenic angles are excluded from the field of view. There prominence of the central pulmonary vasculature. The heart mediastinum are stable. There is no acute osseous abnormality. There is moderate osteoarthritis of bilateral glenohumeral joints. IMPRESSION: Right lower lobe airspace disease concerning for atelectasis versus pneumonia. Bilateral interstitial thickening concerning for underlying mild interstitial edema. Electronically Signed   By: Elbert Ewings  Patel   On: 06/11/2017 14:06    ECG & Cardiac Imaging    Atrial fibrillation, 180, PVCs, anteroseptal infarct,  nonspecific T changes.  Assessment & Plan    1.  Acute respiratory failure/sepsis: Patient presents with a 10-day history of progressive cough followed by lethargy over the past 48 hours and now with fever and leukocytosis.  Lactate 4.34.  Chest x-ray suggested right lower lobe pneumonia.  She is currently hemodynamically stable.  Antibiotics per internal medicine.  2.  Atrial fibrillation with rapid ventricular response: In the setting of above.  Heart rates 180s on admission, currently 140s on IV diltiazem at 15 mg/h.  Management of respiratory failure and sepsis as above.  We will give 1 dose of IV digoxin at 0.125 mg.  Continue Multitaq.  Suspect rates will improve with clinical improvement.  She has not been on oral anticoagulation since October in the setting of progressive dementia and risk of falls.  Family also points out that she is unaware when she is in atrial fibrillation.  If she declares as permanent A. fib, we might wish to consider discontinuation of Multitaq.  Continue beta-blocker.  3.  Coronary artery disease/elevated troponin: Status post prior RCA and diagonal stenting.  Per family, she had not been complaining of chest pain.  Troponin is mildly elevated at 0.18 in the setting of rapid atrial fibrillation with rates into the 180s along with respiratory failure and sepsis.  Continue to trend troponin though given advanced age and dementia, she is a poor candidate for invasive evaluation.  Continue aspirin, nitrate, beta-blocker, and statin therapy.  4.  Essential hypertension: Stable.  Follow on IV diltiazem.  5.  Hyperlipidemia: LDL 59 last year.  Continue statin therapy.  6.  Acute on chronic stage III kidney disease: Creatinine elevated in the setting of above.  Follow.  Signed, Murray Hodgkins, NP 06/15/2017, 4:53 PM  For questions or updates, please contact   Please consult [email or web address] for contact info under Cardiology/STEMI.

## 2017-06-14 ENCOUNTER — Other Ambulatory Visit: Payer: Self-pay

## 2017-06-14 DIAGNOSIS — N183 Chronic kidney disease, stage 3 (moderate): Secondary | ICD-10-CM

## 2017-06-14 DIAGNOSIS — J189 Pneumonia, unspecified organism: Secondary | ICD-10-CM

## 2017-06-14 LAB — URINALYSIS, ROUTINE W REFLEX MICROSCOPIC
BILIRUBIN URINE: NEGATIVE
Glucose, UA: NEGATIVE mg/dL
HGB URINE DIPSTICK: NEGATIVE
Ketones, ur: NEGATIVE mg/dL
Leukocytes, UA: NEGATIVE
Nitrite: NEGATIVE
PH: 6 (ref 5.0–8.0)
Protein, ur: 30 mg/dL — AB
SPECIFIC GRAVITY, URINE: 1.019 (ref 1.005–1.030)
Squamous Epithelial / LPF: NONE SEEN

## 2017-06-14 LAB — BASIC METABOLIC PANEL
Anion gap: 11 (ref 5–15)
BUN: 31 mg/dL — AB (ref 6–20)
CHLORIDE: 112 mmol/L — AB (ref 101–111)
CO2: 18 mmol/L — ABNORMAL LOW (ref 22–32)
CREATININE: 1.59 mg/dL — AB (ref 0.44–1.00)
Calcium: 7.7 mg/dL — ABNORMAL LOW (ref 8.9–10.3)
GFR calc Af Amer: 32 mL/min — ABNORMAL LOW (ref >60)
GFR calc non Af Amer: 28 mL/min — ABNORMAL LOW (ref >60)
GLUCOSE: 217 mg/dL — AB (ref 65–99)
Potassium: 4.2 mmol/L (ref 3.5–5.1)
SODIUM: 141 mmol/L (ref 135–145)

## 2017-06-14 LAB — CBC
HEMATOCRIT: 31.6 % — AB (ref 36.0–46.0)
Hemoglobin: 9.7 g/dL — ABNORMAL LOW (ref 12.0–15.0)
MCH: 25.9 pg — ABNORMAL LOW (ref 26.0–34.0)
MCHC: 30.7 g/dL (ref 30.0–36.0)
MCV: 84.5 fL (ref 78.0–100.0)
Platelets: 268 10*3/uL (ref 150–400)
RBC: 3.74 MIL/uL — ABNORMAL LOW (ref 3.87–5.11)
RDW: 17.6 % — AB (ref 11.5–15.5)
WBC: 25.9 10*3/uL — AB (ref 4.0–10.5)

## 2017-06-14 LAB — STREP PNEUMONIAE URINARY ANTIGEN: STREP PNEUMO URINARY ANTIGEN: NEGATIVE

## 2017-06-14 LAB — MRSA PCR SCREENING: MRSA by PCR: NEGATIVE

## 2017-06-14 MED ORDER — TRAZODONE HCL 50 MG PO TABS: 25.0000 mg | ORAL_TABLET | Freq: Every day | ORAL | Status: DC

## 2017-06-14 MED ORDER — MORPHINE SULFATE (PF) 4 MG/ML IV SOLN
0.5000 mg | INTRAVENOUS | Status: DC | PRN
  Administered 2017-06-15: 0.52 mg via INTRAVENOUS
  Filled 2017-06-14: qty 1

## 2017-06-14 NOTE — Progress Notes (Signed)
Pt. noted to have increased work of breathing and pt. stated she would feel better going back on the BiPAP. BiPAP restarted at 40% O2. RT notified. Sats 94% and RR 34.

## 2017-06-14 NOTE — Progress Notes (Signed)
PROGRESS NOTE    Dominique Perez  EPP:[id number] DOB: 1926/09/08 DOA: 06/27/2017 PCP: Eulas Post, MD    Brief Narrative: Dominique Perez is a 84 y.o. female with medical history significant of A fib, CAD S/P PCI 2007, HTN, uterine cancer, who presents with SOB, in a fib and respiratory distress. Most history was obtain from family who is at bedside. Patient develops a productive cough a week ago.  On Friday she was ill, felt sick. Today she was more SOB.  Patient is able to say few words, she is sleepy. She denies chest pain, report SOB, and cough./   ED Course: patient presents with A fib RVR, febrile tempeture 101, WBC 23, Cr 1.8, BNP 1028, Lactic acid 4.5, chest x ray: Right lower lobe airspace disease concerning for atelectasis versus pneumonia. Bilateral interstitial thickening concerning for underlying mild interstitial edema.     Assessment & Plan:   Active Problems:   PAF (paroxysmal atrial fibrillation) (HCC)   Hypertension   CKD (chronic kidney disease) stage 3, GFR 30-59 ml/min (HCC)   Sepsis (HCC)   Healthcare-associated pneumonia   Acute hypoxic respiratory failure, respiratory Distress; PNA- Health care associated. Influenza A positive Related to PNA, A fib.  Discussed with family will try BIPAP (if patient able to tolerates it)  for increase work of breathing, no intubation.  Daughter was ok with Morphine PRN for increase work of breathing, she wants to make sure her mother is comfortable also.  Will continue with IV antibiotics.  Xopenex PRN.  Received a dose of solumedrol.  Influenza A positive. Continue with tamiflu.  I think she has the flu with superimpose bacterial PNA>   2-Sepsis; in setting of PNA. And influenza.  Patient present with fever, leukocytosis, lactic acidosis.  She has received 2 L IV bolus in the ED.  Treated with IV vancomycin, cefepime, Doxy (for atypical coverage) on admission. Will stop vancomycin MRSA PCR negative.  Influenza A  positive. Continue with tamiflu.  Check UA, culture. Legionella, strep antigen.   2-A fib RVR;  Started on Cardizem Gtt.  Cardiology consulted. Received dose of Dogoxin.  Resume metoprolol.   4-AKI on chronic renal failure stage III;  Cr at 1.5 earlier this month, 1.1 ten month ago.  Suspect related to sepsis, hypoperfusion.  Treated with IV fluids.  Improved, cr decreased to 1.5.   5-GERD; Continue with IV Protonix.       DVT prophylaxis: lovenox.  Code Status: DNR Family Communication: care discussed with daughter  Disposition Plan: remain inpatient.    Consultants:   Cardiology    Procedures:   none  Antimicrobials;  Dose of vancomycin.  Cefepime and doxy.  tamiflu   Subjective: She is more alert today, breathing better.   Objective: Vitals:   06/14/17 0300 06/14/17 0345 06/14/17 0400 06/14/17 0500  BP:   (!) 110/57   Pulse:  (!) 103 79   Resp:  (!) 30 (!) 26   Temp: (!) 97.2 F (36.2 C)     TempSrc: Axillary     SpO2:  99% 98%   Weight:    60.3 kg (132 lb 15 oz)  Height:        Intake/Output Summary (Last 24 hours) at 06/14/2017 0722 Last data filed at 06/14/2017 0701 Gross per 24 hour  Intake 3891.25 ml  Output -  Net 3891.25 ml   Filed Weights   06/28/2017 1424 06/14/17 0500  Weight: 59 kg (130 lb) 60.3 kg (132 lb 15  oz)    Examination:  General exam: NAD Respiratory system: Bilateral ronchus.  Cardiovascular system: S1 & S2 heard, RRR. No JVD, murmurs, rubs, gallops or clicks. No pedal edema. Gastrointestinal system: Abdomen is nondistended, soft and nontender. No organomegaly or masses felt. Normal bowel sounds heard. Central nervous system: Alert  Extremities: Symmetric 5 x 5 power. Skin: No rashes, lesions or ulcers     Data Reviewed: I have personally reviewed following labs and imaging studies  CBC: Recent Labs  Lab 07/07/2017 1259 06/24/2017 1327 06/14/17 0419  WBC 23.5*  --  25.9*  NEUTROABS 21.9*  --   --   HGB 11.6*  12.6 9.7*  HCT 36.8 37.0 31.6*  MCV 84.2  --  84.5  PLT 378  --  175   Basic Metabolic Panel: Recent Labs  Lab 06/28/2017 1259 06/12/2017 1327 06/14/17 0419  NA 141 144 141  K 4.3 4.2 4.2  CL 107 110 112*  CO2 18*  --  18*  GLUCOSE 147* 156* 217*  BUN 27* 27* 31*  CREATININE 1.85* 1.70* 1.59*  CALCIUM 8.5*  --  7.7*   GFR: Estimated Creatinine Clearance: 20.2 mL/min (A) (by C-G formula based on SCr of 1.59 mg/dL (H)). Liver Function Tests: Recent Labs  Lab 06/30/2017 1259  AST 36  ALT 15  ALKPHOS 105  BILITOT 0.7  PROT 7.7  ALBUMIN 2.4*   No results for input(s): LIPASE, AMYLASE in the last 168 hours. No results for input(s): AMMONIA in the last 168 hours. Coagulation Profile: No results for input(s): INR, PROTIME in the last 168 hours. Cardiac Enzymes: No results for input(s): CKTOTAL, CKMB, CKMBINDEX, TROPONINI in the last 168 hours. BNP (last 3 results) No results for input(s): PROBNP in the last 8760 hours. HbA1C: No results for input(s): HGBA1C in the last 72 hours. CBG: No results for input(s): GLUCAP in the last 168 hours. Lipid Profile: No results for input(s): CHOL, HDL, LDLCALC, TRIG, CHOLHDL, LDLDIRECT in the last 72 hours. Thyroid Function Tests: No results for input(s): TSH, T4TOTAL, FREET4, T3FREE, THYROIDAB in the last 72 hours. Anemia Panel: No results for input(s): VITAMINB12, FOLATE, FERRITIN, TIBC, IRON, RETICCTPCT in the last 72 hours. Sepsis Labs: Recent Labs  Lab 06/25/2017 1328 07/08/2017 1440 07/07/2017 1822 06/29/2017 2130  PROCALCITON  --   --  3.86  --   LATICACIDVEN 4.17* 4.34* 3.1* 1.5    No results found for this or any previous visit (from the past 240 hour(s)).       Radiology Studies: Dg Chest Port 1 View  Result Date: 07/07/2017 CLINICAL DATA:  Shortness of breath EXAM: PORTABLE CHEST 1 VIEW COMPARISON:  12/17/2015 FINDINGS: There is bilateral interstitial thickening. There is right lower lobe airspace disease. Bilateral  costophrenic angles are excluded from the field of view. There prominence of the central pulmonary vasculature. The heart mediastinum are stable. There is no acute osseous abnormality. There is moderate osteoarthritis of bilateral glenohumeral joints. IMPRESSION: Right lower lobe airspace disease concerning for atelectasis versus pneumonia. Bilateral interstitial thickening concerning for underlying mild interstitial edema. Electronically Signed   By: Kathreen Devoid   On: 06/30/2017 14:06        Scheduled Meds: . enoxaparin (LOVENOX) injection  30 mg Subcutaneous Q24H  . metoprolol tartrate  25 mg Oral BID  . oseltamivir  30 mg Oral Daily  . pantoprazole (PROTONIX) IV  40 mg Intravenous Q24H  . traZODone  25 mg Oral BID   Continuous Infusions: . sodium chloride 75  mL/hr at 06/14/17 0655  . acetaminophen 1,000 mg (06/14/2017 1740)  . ceFEPime (MAXIPIME) IV    . diltiazem (CARDIZEM) infusion 15 mg/hr (06/14/17 0655)  . doxycycline (VIBRAMYCIN) IV Stopped (06/14/17 0701)  . [START ON 06/15/2017] vancomycin       LOS: 1 day    Time spent: 35 minutes.     Elmarie Shiley, MD [facility] Pager [phone]  If 7PM-7AM, please contact night-coverage [email or web address] Password [email or web address] 06/14/2017, 7:22 AM

## 2017-06-14 NOTE — Progress Notes (Signed)
DAILY PROGRESS NOTE   Patient Name: Dominique Perez Date of Encounter: 06/14/2017  Chief Complaint   Awake, but disoriented  Patient Profile   Dominique Perez is a 84 y.o. female with a history of CAD s/p prior RCA and D1 stenting, PAF, HTN, HL, chronic lower ext edema, and progressive dementia, who is being seen today for the evaluation of AFib w/ RVR in the setting of resp failure and sepsis at the request of Dr. Tyrell Antonio.  Subjective   Rate-controlled a-fib noted overnight. Dronedarone discontinued - on IV diltiazem for rate control. More awake today - she is influenza positive.  Objective   Vitals:   06/14/17 0806 06/14/17 0850 06/14/17 0900 06/14/17 1019  BP: (!) 119/56  (!) 124/55 (!) 118/104  Pulse: 78  78 100  Resp: (!) 30  (!) 28   Temp: 98.3 F (36.8 C)     TempSrc: Oral     SpO2: 95% 94% 96%   Weight:      Height:        Intake/Output Summary (Last 24 hours) at 06/14/2017 1050 Last data filed at 06/14/2017 0701 Gross per 24 hour  Intake 3891.25 ml  Output -  Net 3891.25 ml   Filed Weights   06/11/2017 1424 06/14/17 0500  Weight: 130 lb (59 kg) 132 lb 15 oz (60.3 kg)    Physical Exam   General appearance: alert, appears older than stated age, no distress and lethargic Neck: no carotid bruit, no JVD and thyroid not enlarged, symmetric, no tenderness/mass/nodules Lungs: rhonchi bilaterally Heart: irregularly irregular rhythm Abdomen: soft, non-tender; bowel sounds normal; no masses,  no organomegaly Extremities: extremities normal, atraumatic, no cyanosis or edema Pulses: 2+ and symmetric Skin: pale, warm, dry Neurologic: Mental status: opens eyes to voice, confused Psych: mild dementia  Inpatient Medications    Scheduled Meds: . enoxaparin (LOVENOX) injection  30 mg Subcutaneous Q24H  . metoprolol tartrate  25 mg Oral BID  . oseltamivir  30 mg Oral Daily  . pantoprazole (PROTONIX) IV  40 mg Intravenous Q24H  . traZODone  25 mg Oral QHS    Continuous  Infusions: . acetaminophen 1,000 mg (06/12/2017 1740)  . ceFEPime (MAXIPIME) IV    . diltiazem (CARDIZEM) infusion 15 mg/hr (06/14/17 0655)  . doxycycline (VIBRAMYCIN) IV Stopped (06/14/17 0701)  . [START ON 06/15/2017] vancomycin      PRN Meds: acetaminophen, acetaminophen **OR** acetaminophen, levalbuterol, metoprolol tartrate, morphine injection, ondansetron **OR** ondansetron (ZOFRAN) IV   Labs   Results for orders placed or performed during the hospital encounter of 06/16/2017 (from the past 48 hour(s))  CBC with Differential/Platelet     Status: Abnormal   Collection Time: 06/26/2017 12:59 PM  Result Value Ref Range   WBC 23.5 (H) 4.0 - 10.5 K/uL   RBC 4.37 3.87 - 5.11 MIL/uL   Hemoglobin 11.6 (L) 12.0 - 15.0 g/dL   HCT 36.8 36.0 - 46.0 %   MCV 84.2 78.0 - 100.0 fL   MCH 26.5 26.0 - 34.0 pg   MCHC 31.5 30.0 - 36.0 g/dL   RDW 17.5 (H) 11.5 - 15.5 %   Platelets 378 150 - 400 K/uL   Neutrophils Relative % 93 %   Lymphocytes Relative 3 %   Monocytes Relative 4 %   Eosinophils Relative 0 %   Basophils Relative 0 %   Neutro Abs 21.9 (H) 1.7 - 7.7 K/uL   Lymphs Abs 0.7 0.7 - 4.0 K/uL   Monocytes Absolute 0.9 0.1 -  1.0 K/uL   Eosinophils Absolute 0.0 0.0 - 0.7 K/uL   Basophils Absolute 0.0 0.0 - 0.1 K/uL   Smear Review MORPHOLOGY UNREMARKABLE     Comment: Performed at [facility] [address] [place], [place] [address]  Comprehensive metabolic panel     Status: Abnormal   Collection Time: 06/19/2017 12:59 PM  Result Value Ref Range   Sodium 141 135 - 145 mmol/L   Potassium 4.3 3.5 - 5.1 mmol/L   Chloride 107 101 - 111 mmol/L   CO2 18 (L) 22 - 32 mmol/L   Glucose, Bld 147 (H) 65 - 99 mg/dL   BUN 27 (H) 6 - 20 mg/dL   Creatinine, Ser 1.85 (H) 0.44 - 1.00 mg/dL   Calcium 8.5 (L) 8.9 - 10.3 mg/dL   Total Protein 7.7 6.5 - 8.1 g/dL   Albumin 2.4 (L) 3.5 - 5.0 g/dL   AST 36 15 - 41 U/L   ALT 15 14 - 54 U/L   Alkaline Phosphatase 105 38 - 126 U/L   Total Bilirubin 0.7  0.3 - 1.2 mg/dL   GFR calc non Af Amer 23 (L) >60 mL/min   GFR calc Af Amer 27 (L) >60 mL/min    Comment: (NOTE) The eGFR has been calculated using the CKD EPI equation. This calculation has not been validated in all clinical situations. eGFR's persistently <60 mL/min signify possible Chronic Kidney Disease.    Anion gap 16 (H) 5 - 15    Comment: Performed at [facility], [place] [address], [place], [place] [address]  I-stat troponin, ED     Status: Abnormal   Collection Time: 07/03/2017  1:24 PM  Result Value Ref Range   Troponin i, poc 0.18 (HH) 0.00 - 0.08 ng/mL   Comment NOTIFIED PHYSICIAN    Comment 3            Comment: Due to the release kinetics of cTnI, a negative result within the first hours of the onset of symptoms does not rule out myocardial infarction with certainty. If myocardial infarction is still suspected, repeat the test at appropriate intervals.   I-stat chem 8, ed     Status: Abnormal   Collection Time: 06/11/2017  1:27 PM  Result Value Ref Range   Sodium 144 135 - 145 mmol/L   Potassium 4.2 3.5 - 5.1 mmol/L   Chloride 110 101 - 111 mmol/L   BUN 27 (H) 6 - 20 mg/dL   Creatinine, Ser 1.70 (H) 0.44 - 1.00 mg/dL   Glucose, Bld 156 (H) 65 - 99 mg/dL   Calcium, Ion 1.05 (L) 1.15 - 1.40 mmol/L   TCO2 20 (L) 22 - 32 mmol/L   Hemoglobin 12.6 12.0 - 15.0 g/dL   HCT 37.0 36.0 - 46.0 %  I-Stat CG4 Lactic Acid, ED     Status: Abnormal   Collection Time: 06/30/2017  1:28 PM  Result Value Ref Range   Lactic Acid, Venous 4.17 (HH) 0.5 - 1.9 mmol/L   Comment NOTIFIED PHYSICIAN   Brain natriuretic peptide     Status: Abnormal   Collection Time: 06/23/2017  2:16 PM  Result Value Ref Range   B Natriuretic Peptide 1,028.4 (H) 0.0 - 100.0 pg/mL    Comment: Performed at [facility], [place] [address] [place], [place] [address]  I-Stat CG4 Lactic Acid, ED  (not at  [facility])     Status: Abnormal   Collection Time: 07/01/2017  2:40 PM  Result  Value Ref Range   Lactic Acid,  Venous 4.34 (HH) 0.5 - 1.9 mmol/L   Comment NOTIFIED PHYSICIAN   Influenza panel by PCR (type A & B)     Status: Abnormal   Collection Time: 06/20/2017  4:37 PM  Result Value Ref Range   Influenza A By PCR POSITIVE (A) NEGATIVE   Influenza B By PCR NEGATIVE NEGATIVE    Comment: (NOTE) The Xpert Xpress Flu assay is intended as an aid in the diagnosis of  influenza and should not be used as a sole basis for treatment.  This  assay is FDA approved for nasopharyngeal swab specimens only. Nasal  washings and aspirates are unacceptable for Xpert Xpress Flu testing. Performed at [facility], [place] [address], [place], [place] [address]   Lactic acid, plasma     Status: Abnormal   Collection Time: 07/08/2017  6:22 PM  Result Value Ref Range   Lactic Acid, Venous 3.1 (HH) 0.5 - 1.9 mmol/L    Comment: CRITICAL RESULT CALLED TO, READ BACK BY AND VERIFIED WITH: Kerry Dory, RN 1953 07/04/2017 THOMPSON V Performed at [facility], [place] [address], [place], [place] [address]   Procalcitonin     Status: None   Collection Time: 07/01/2017  6:22 PM  Result Value Ref Range   Procalcitonin 3.86 ng/mL    Comment:        Interpretation: PCT > 2 ng/mL: Systemic infection (sepsis) is likely, unless other causes are known. (NOTE)       Sepsis PCT Algorithm           Lower Respiratory Tract                                      Infection PCT Algorithm    ----------------------------     ----------------------------         PCT < 0.25 ng/mL                PCT < 0.10 ng/mL         Strongly encourage             Strongly discourage   discontinuation of antibiotics    initiation of antibiotics    ----------------------------     -----------------------------       PCT 0.25 - 0.50 ng/mL            PCT 0.10 - 0.25 ng/mL               OR       >80% decrease in PCT            Discourage initiation of                                            antibiotics      Encourage discontinuation           of  antibiotics    ----------------------------     -----------------------------         PCT >= 0.50 ng/mL              PCT 0.26 - 0.50 ng/mL               AND       <80% decrease in PCT  Encourage initiation of                                             antibiotics       Encourage continuation           of antibiotics    ----------------------------     -----------------------------        PCT >= 0.50 ng/mL                  PCT > 0.50 ng/mL               AND         increase in PCT                  Strongly encourage                                      initiation of antibiotics    Strongly encourage escalation           of antibiotics                                     -----------------------------                                           PCT <= 0.25 ng/mL                                                 OR                                        > 80% decrease in PCT                                     Discontinue / Do not initiate                                             antibiotics Performed at [facility], [address] [place], [place] [address]   Blood gas, arterial     Status: Abnormal   Collection Time: 07/08/2017  9:11 PM  Result Value Ref Range   FIO2 50.00    Delivery systems BILEVEL POSITIVE AIRWAY PRESSURE    Inspiratory PAP 10    Expiratory PAP 4    pH, Arterial 7.268 (L) 7.350 - 7.450   pCO2 arterial 46.7 32.0 - 48.0 mmHg   pO2, Arterial 99.4 83.0 - 108.0 mmHg   Bicarbonate 20.8 20.0 - 28.0 mmol/L   Acid-base deficit 5.1 (H) 0.0 - 2.0 mmol/L   O2 Saturation 96.8 %   Patient temperature 97.9    Collection site RIGHT RADIAL  Drawn by [id number]    Sample type ARTERIAL DRAW    Allens test (pass/fail) PASS PASS  Lactic acid, plasma     Status: None   Collection Time: 07/02/2017  9:30 PM  Result Value Ref Range   Lactic Acid, Venous 1.5 0.5 - 1.9 mmol/L    Comment: Performed at [facility] [address] [place], [place] [address]    Basic metabolic panel     Status: Abnormal   Collection Time: 06/14/17  4:19 AM  Result Value Ref Range   Sodium 141 135 - 145 mmol/L   Potassium 4.2 3.5 - 5.1 mmol/L   Chloride 112 (H) 101 - 111 mmol/L   CO2 18 (L) 22 - 32 mmol/L   Glucose, Bld 217 (H) 65 - 99 mg/dL   BUN 31 (H) 6 - 20 mg/dL   Creatinine, Ser 1.59 (H) 0.44 - 1.00 mg/dL   Calcium 7.7 (L) 8.9 - 10.3 mg/dL   GFR calc non Af Amer 28 (L) >60 mL/min   GFR calc Af Amer 32 (L) >60 mL/min    Comment: (NOTE) The eGFR has been calculated using the CKD EPI equation. This calculation has not been validated in all clinical situations. eGFR's persistently <60 mL/min signify possible Chronic Kidney Disease.    Anion gap 11 5 - 15    Comment: Performed at [facility] [address] [place], [place] [address]  CBC     Status: Abnormal   Collection Time: 06/14/17  4:19 AM  Result Value Ref Range   WBC 25.9 (H) 4.0 - 10.5 K/uL   RBC 3.74 (L) 3.87 - 5.11 MIL/uL   Hemoglobin 9.7 (L) 12.0 - 15.0 g/dL    Comment: DELTA CHECK NOTED REPEATED TO VERIFY    HCT 31.6 (L) 36.0 - 46.0 %   MCV 84.5 78.0 - 100.0 fL   MCH 25.9 (L) 26.0 - 34.0 pg   MCHC 30.7 30.0 - 36.0 g/dL   RDW 17.6 (H) 11.5 - 15.5 %   Platelets 268 150 - 400 K/uL    Comment: Performed at [facility], [place] [address] [place], [place] [address]    ECG   N/A  Telemetry   A-fib with CVR - Personally Reviewed  Radiology    Dg Chest Port 1 View  Result Date: 06/28/2017 CLINICAL DATA:  Shortness of breath EXAM: PORTABLE CHEST 1 VIEW COMPARISON:  12/17/2015 FINDINGS: There is bilateral interstitial thickening. There is right lower lobe airspace disease. Bilateral costophrenic angles are excluded from the field of view. There prominence of the central pulmonary vasculature. The heart mediastinum are stable. There is no acute osseous abnormality. There is moderate osteoarthritis of bilateral glenohumeral joints. IMPRESSION: Right lower lobe airspace  disease concerning for atelectasis versus pneumonia. Bilateral interstitial thickening concerning for underlying mild interstitial edema. Electronically Signed   By: Kathreen Devoid   On: 06/25/2017 14:06    Cardiac Studies   N/A  Assessment   1. Active Problems: 2.   PAF (paroxysmal atrial fibrillation) ([place]) 3.   Hypertension 4.   CKD (chronic kidney disease) stage 3, GFR 30-59 ml/min (H[facility]) 5.   Sepsis ([place]) 6.   Healthcare-associated pneumonia 7.   Plan   1. Continue IV ditiazem for rate-control. She is not reliably taking po at this point. Will consider 2D echo later in the week as she improves from her influenza pneumonia.   Time Spent Directly with Patient:  I have spent a total  of 15 minutes with the patient reviewing hospital notes, telemetry, EKGs, labs and examining the patient as well as establishing an assessment and plan that was discussed personally with the patient. > 50% of time was spent in direct patient care.  Length of Stay:  LOS: 1 day   Pixie Casino, MD, [facility], [place] Director of the [facility]  [facility] Diplomate of the American Board of Clinical Lipidology Attending Cardiologist  Direct Dial: [phone]  Fax: [phone]  Website:  [email or web address] Osgood Jobani Sabado 06/14/2017, 10:50 AM

## 2017-06-14 NOTE — Evaluation (Signed)
Clinical/Bedside Swallow Evaluation Patient Details  Name: Hanadi LYANNE KATES MRN: [id number] Date of Birth: 1926/05/29  Today's Date: 06/14/2017 Time: SLP Start Time (ACUTE ONLY): 1000 SLP Stop Time (ACUTE ONLY): 1016 SLP Time Calculation (min) (ACUTE ONLY): 16 min  Past Medical History:  Past Medical History:  Diagnosis Date  . Arthritis   . Coronary artery disease    a. 1999 s/p PCI/BMS of the RCA; b. 10/2005 PCI: LM nl, LAD min irregs, D1 90ost (2.5 x 23 Cypher DES), D2 40-50, LCX small, min irregs, RCA patent stents, otw min irregs, EF 60%.  . Dementia   . Fracture of right femur ([place])   . Gallstones   . GERD (gastroesophageal reflux disease)   . History of echocardiogram    a. 02/2011 Echo: EF 55-60%, mildly dil LA, mild to mod TR, PASP 12mmHg.  Marland Kitchen Hyperlipidemia   . Hypertension   . Leg pain, right    chronic  . Lymphedema    a. chronic bilat LEE -> improved with compression stockings.  Marland Kitchen PAF (paroxysmal atrial fibrillation) ([place])    a. CHA2DS2VASc = 5-->prev on xarelto->d/c'd in 02/2017 2/2 falls and progressive dementia. On Multaq.  . Tremor    chronic  . Uterus cancer ([place]) 10/2016   Dr. Dellis Filbert   Past Surgical History:  Past Surgical History:  Procedure Laterality Date  . CARDIAC CATHETERIZATION  1999    PCI to RCA  . CARDIAC CATHETERIZATION  2007   PCI to diagonal  . CARDIAC CATHETERIZATION  2002   treated medically  . CATARACT EXTRACTION    . femoral pseudoaneurysm  1999   repair  . KNEE SURGERY     right, titanium rod in the right femur  . left knee  2009   replacement by Dr. Wynelle Link  . TOTAL HIP ARTHROPLASTY     Dr. Durward Fortes   HPI:  Ms. Tomasini is an 74F with paroxysmal atrial fibrillation, hypertension, hyperlipidemia, CAD s/p PCI, and dementia here with sepsis 2/2 pneumonia and atrial fibrillation with RVR. She had a rapid decline over the last three days and is not currently responsive to questions.Family wishes to avoid any aggressive interventions.  They are OK with trying BiPAP.  Pt diagnosed with flu and pna. Daughter denies any difficulty with swallowing prior to admit.    Assessment / Plan / Recommendation Clinical Impression  Pt demonstrates severe deconditioning and weakness impacting responsiveness to PO. Pt quickly fatigues. Able to take a bite of puree with prolonged oral manipultion but eventual swallow response with no signs of aspiration. Pt could not seal lips to cup or spoon for ice or water. Recommend pt remain NPO except for necessary meds given crushed in puree. Discussed with RN. Will f/u for diet readiness.  SLP Visit Diagnosis: Dysphagia, unspecified (R13.10)    Aspiration Risk  Mild aspiration risk    Diet Recommendation NPO except meds   Medication Administration: Crushed with puree Supervision: Full supervision/cueing for compensatory strategies    Other  Recommendations Oral Care Recommendations: Oral care BID Other Recommendations: Have oral suction available   Follow up Recommendations Skilled Nursing facility      Frequency and Duration min 2x/week  2 weeks       Prognosis Prognosis for Safe Diet Advancement: Good      Swallow Study   General HPI: Ms. Londo is an 46F with paroxysmal atrial fibrillation, hypertension, hyperlipidemia, CAD s/p PCI, and dementia here with sepsis 2/2 pneumonia and atrial fibrillation with RVR. She had a  rapid decline over the last three days and is not currently responsive to questions.Family wishes to avoid any aggressive interventions. They are OK with trying BiPAP.  Pt diagnosed with flu and pna. Daughter denies any difficulty with swallowing prior to admit.  Type of Study: Bedside Swallow Evaluation Previous Swallow Assessment: none Diet Prior to this Study: NPO Temperature Spikes Noted: No Respiratory Status: Nasal cannula History of Recent Intubation: No Behavior/Cognition: Lethargic/Drowsy Oral Cavity Assessment: Dry Oral Care Completed by SLP: Yes Oral  Cavity - Dentition: Edentulous Self-Feeding Abilities: Total assist Patient Positioning: Upright in bed Baseline Vocal Quality: Breathy;Low vocal intensity Volitional Cough: Weak Volitional Swallow: Unable to elicit    Oral/Motor/Sensory Function Overall Oral Motor/Sensory Function: Generalized oral weakness   Ice Chips Ice chips: Impaired Presentation: Spoon Oral Phase Functional Implications: Prolonged oral transit   Thin Liquid Thin Liquid: Not tested    Nectar Thick Nectar Thick Liquid: Not tested   Honey Thick Honey Thick Liquid: Not tested   Puree Puree: Impaired Presentation: Spoon Oral Phase Functional Implications: Prolonged oral transit   Solid   GO   Solid: Not tested       Herbie [place], MA CCC-SLP [phone]  Lan Mcneill, Katherene Ponto 06/14/2017,10:27 AM

## 2017-06-15 DIAGNOSIS — J101 Influenza due to other identified influenza virus with other respiratory manifestations: Secondary | ICD-10-CM

## 2017-06-15 LAB — CBC
HEMATOCRIT: 33.7 % — AB (ref 36.0–46.0)
Hemoglobin: 10.1 g/dL — ABNORMAL LOW (ref 12.0–15.0)
MCH: 25.4 pg — ABNORMAL LOW (ref 26.0–34.0)
MCHC: 30 g/dL (ref 30.0–36.0)
MCV: 84.7 fL (ref 78.0–100.0)
Platelets: 292 10*3/uL (ref 150–400)
RBC: 3.98 MIL/uL (ref 3.87–5.11)
RDW: 17.5 % — AB (ref 11.5–15.5)
WBC: 28.9 10*3/uL — ABNORMAL HIGH (ref 4.0–10.5)

## 2017-06-15 LAB — BASIC METABOLIC PANEL
Anion gap: 13 (ref 5–15)
BUN: 33 mg/dL — ABNORMAL HIGH (ref 6–20)
CALCIUM: 8.4 mg/dL — AB (ref 8.9–10.3)
CO2: 20 mmol/L — AB (ref 22–32)
CREATININE: 1.64 mg/dL — AB (ref 0.44–1.00)
Chloride: 111 mmol/L (ref 101–111)
GFR calc non Af Amer: 27 mL/min — ABNORMAL LOW (ref >60)
GFR, EST AFRICAN AMERICAN: 31 mL/min — AB (ref >60)
GLUCOSE: 150 mg/dL — AB (ref 65–99)
Potassium: 4 mmol/L (ref 3.5–5.1)
Sodium: 144 mmol/L (ref 135–145)

## 2017-06-15 LAB — LEGIONELLA PNEUMOPHILA SEROGP 1 UR AG: L. pneumophila Serogp 1 Ur Ag: NEGATIVE

## 2017-06-15 MED ORDER — PIPERACILLIN-TAZOBACTAM IN DEX 2-0.25 GM/50ML IV SOLN
2.2500 g | Freq: Four times a day (QID) | INTRAVENOUS | Status: DC
  Administered 2017-06-15 (×2): 2.25 g via INTRAVENOUS
  Filled 2017-06-15 (×5): qty 50

## 2017-06-15 NOTE — Consult Note (Signed)
Consultation Note Date: 06/15/2017   Patient Name: Dominique Perez  DOB: 1927/04/16  MRN: [id number]  Age / Sex: 84 y.o., female  PCP: Dominique Post, MD Referring Physician: Elmarie Shiley, MD  Reason for Consultation: Establishing goals of care  HPI/Patient Profile: 84 y.o. female  with past medical history of dementia,  A. Fib, CAD, HTN, uterine CA, presents w/ cough congestion x 1 week, worsening SOB, workup reveals interstitial edema, sepsis, influenza, pneumonia, respiratory failuire requiring bipap, admitted on 06/27/2017 for management. During admission WBC has continued to increase despite IV fluids, and antibiotics, she has continued to require intermittent bipap. Kidney function remains stable but has not improved. Per discussion with attending team patient's daughter has requested  DNR, DNI, no pressers. SLP has recommended NPO status. She has been started on prn morphine for some comfort. Palliative medicine consulted for ongoing Dominique Perez as patient showing no signs of improvement.    Clinical Assessment and Goals of Care:  I have reviewed medical records including EPIC notes, labs and imaging, received report from Dr. Tyrell Perez and patient's bedside RN, Dominique Perez. I assessed the patient and found her to be in significant distress, coughing up thick phlegm, with RR greatly increased. She appeared to be very uncomfortable. Offered support. Helped clean her nose and mittens of secretions. Requested bedside RN administer prn morphine.   I called and spoke with patient's daughterLeda Perez. I introduced palliative medicine. Palliative medicine is specialized medical care for people living with serious illness. It focuses on providing relief from the symptoms and stress of a serious illness. The goal is to improve quality of life for both the patient and the family.  We arranged to meet tomorrow morning at  0830 for full Tobias meeting  Primary Decision Maker NEXT OF KIN- patient's daughter- Dominique Perez    SUMMARY OF RECOMMENDATIONS -Cont current level of care- do not escalate -Symptom management for SOB-  -Keep room cold  -Administer morphine prn as ordered  -Apply cold compress to cheeks and forehead prn  -Fan blowing in face when bipap applied    Code Status/Advance Care Planning:  DNR  Palliative Prophylaxis:   Delirium Protocol, Frequent Pain Assessment and frequent assessment for SOB   Prognosis:    Unable to determine  Discharge Planning: To Be Determined  Primary Diagnoses: Present on Admission: . Sepsis ([place]) . PAF (paroxysmal atrial fibrillation) ([place]) . Hypertension . CKD (chronic kidney disease) stage 3, GFR 30-59 ml/min (HCC)   I have reviewed the medical record, interviewed the patient and family, and examined the patient. The following aspects are pertinent.  Past Medical History:  Diagnosis Date  . Arthritis   . Coronary artery disease    a. 1999 s/p PCI/BMS of the RCA; b. 10/2005 PCI: LM nl, LAD min irregs, D1 90ost (2.5 x 23 Cypher DES), D2 40-50, LCX small, min irregs, RCA patent stents, otw min irregs, EF 60%.  . Dementia   . Fracture of right femur ([place])   . Gallstones   .  GERD (gastroesophageal reflux disease)   . History of echocardiogram    a. 02/2011 Echo: EF 55-60%, mildly dil LA, mild to mod TR, PASP 60mmHg.  Marland Kitchen Hyperlipidemia   . Hypertension   . Leg pain, right    chronic  . Lymphedema    a. chronic bilat LEE -> improved with compression stockings.  Marland Kitchen PAF (paroxysmal atrial fibrillation) ([place])    a. CHA2DS2VASc = 5-->prev on xarelto->d/c'd in 02/2017 2/2 falls and progressive dementia. On Multaq.  . Tremor    chronic  . Uterus cancer ([place]) 10/2016   Dr. Dellis Perez   Social History   Socioeconomic History  . Marital status: Widowed    Spouse name: None  . Number of children: 4  . Years of education: None  . Highest education  level: None  Social Needs  . Financial resource strain: None  . Food insecurity - worry: None  . Food insecurity - inability: None  . Transportation needs - medical: None  . Transportation needs - non-medical: None  Occupational History  . None  Tobacco Use  . Smoking status: Never Smoker  . Smokeless tobacco: Never Used  Substance and Sexual Activity  . Alcohol use: No  . Drug use: No  . Sexual activity: No  Other Topics Concern  . None  Social History Narrative   Was living independently with family nearby until the Fall of 2018.  Now in [facility].   Family History  Problem Relation Age of Onset  . Heart attack Mother   . Cancer Father    Scheduled Meds: . enoxaparin (LOVENOX) injection  30 mg Subcutaneous Q24H  . metoprolol tartrate  25 mg Oral BID  . oseltamivir  30 mg Oral Daily  . pantoprazole (PROTONIX) IV  40 mg Intravenous Q24H  . traZODone  25 mg Oral QHS   Continuous Infusions: . diltiazem (Dominique Perez) infusion Stopped (06/15/17 0429)  . doxycycline (VIBRAMYCIN) IV Stopped (06/15/17 0646)  . piperacillin-tazobactam (ZOSYN)  IV Stopped (06/15/17 1215)   PRN Meds:.acetaminophen **OR** acetaminophen, levalbuterol, metoprolol tartrate, morphine injection, ondansetron **OR** ondansetron (ZOFRAN) IV Medications Prior to Admission:  Prior to Admission medications   Medication Sig Start Date End Date Taking? Authorizing Provider  aspirin EC 81 MG tablet Take 1 tablet (81 mg total) by mouth daily. 03/03/17  Yes Perez, Dominique Laud, PA  dronedarone (MULTAQ) 400 MG tablet TAKE 1 BY MOUTH TWICE DAILY WITH A MEAL 07/02/16  Yes Dominique Perez, Dominique M, MD  furosemide (LASIX) 20 MG tablet Take 1 tablet (20 mg total) by mouth daily. 05/12/17  Yes Perez, Dominique Sierras, MD  isosorbide mononitrate (IMDUR) 60 MG 24 hr tablet TAKE 1 TABLET (60 MG TOTAL) BY MOUTH DAILY. 07/14/16  Yes Dominique Perez, Dominique M, MD  megestrol (MEGACE) 40 MG tablet Take 1 tablet (40 mg total) by mouth 2 (two) times daily. 12/11/16   Yes Dominique Bruins, MD  metoprolol tartrate (LOPRESSOR) 25 MG tablet Take 1 tablet (25 mg total) by mouth 2 (two) times daily. 07/02/16  Yes Dominique Perez, Dominique M, MD  simvastatin (ZOCOR) 20 MG tablet Take 0.5 tablets (10 mg total) by mouth daily at 6 PM. 07/02/16  Yes Dominique Perez, Dominique M, MD  traZODone (DESYREL) 50 MG tablet Take 25 mg by mouth 2 (two) times daily.   Yes [provider]  lansoprazole (PREVACID) 30 MG capsule Take 1 capsule (30 mg total) by mouth daily at 12 noon. Patient taking differently: Take 30 mg by mouth. Patient takes in evening with meal 07/02/16  Dominique Perez, Dominique M, MD  traMADol-acetaminophen (ULTRACET) 37.5-325 MG tablet Take 1 tablet by mouth every 6 (six) hours. Patient not taking: Reported on 06/19/2017 03/12/17   Dominique Post, MD   Allergies  Allergen Reactions  . Amiodarone Other (See Comments)    tremor  . Erythromycin Stearate Other (See Comments)    REACTION: fever  . Ferrous Sulfate Other (See Comments)    REACTION: fever  . Iohexol Other (See Comments)     Code: HIVES, Desc: Pt states that 10 yrs ago she IV contrast for a CT and broke out in hives and experienced SOB.  Done w/o iv contrast today., Onset Date: [id number]   . Sulfa Antibiotics Hives   Review of Systems  Unable to perform ROS: Dementia    Physical Exam  Constitutional:  frail  HENT:  Flushed, clear discharge from nares  Cardiovascular: An irregular rhythm present. Tachycardia present.  Tachycardic, irregular  Pulmonary/Chest: She has wheezes. She has rales.  Increased resp effort, cough with thick yellow/green mucous production, diminished sounds in R base  Abdominal: Soft. Bowel sounds are normal.  Skin:  flushed  Nursing note and vitals reviewed.   Vital Signs: BP 90/78   Pulse 88   Temp (!) 97.4 F (36.3 C) (Oral)   Resp (!) 32   Ht 5\' 3"  (1.6 m)   Wt 60.9 kg (134 lb 4.2 oz)   SpO2 96%   BMI 23.78 kg/m  Pain Assessment: No/denies pain   Pain Score: 0-No  pain   SpO2: SpO2: 96 % O2 Device:SpO2: 96 % O2 Flow Rate: .O2 Flow Rate (L/min): 15 L/min  IO: Intake/output summary:   Intake/Output Summary (Last 24 hours) at 06/15/2017 1426 Last data filed at 06/15/2017 0646 Gross per 24 hour  Intake 751.67 ml  Output 150 ml  Net 601.67 ml    LBM: Last BM Date: 06/14/17 Baseline Weight: Weight: 59 kg (130 lb) Most recent weight: Weight: 60.9 kg (134 lb 4.2 oz)     Palliative Assessment/Data: PPS: 10%     Thank you for this consult. Palliative medicine will continue to follow and assist as needed.   Time In: 1330 Time Out: 1440 Time Total: 70 minutes Greater than 50%  of this time was spent counseling and coordinating care related to the above assessment and plan.  Signed by: Mariana Kaufman, AGNP-C Palliative Medicine    Please contact Palliative Medicine Team phone at [phone] for questions and concerns.  For individual provider: See Shea Evans

## 2017-06-15 NOTE — Progress Notes (Addendum)
PROGRESS NOTE    Kyla ALIEYAH SPADER  ZOX:[id number] DOB: [birth year] DOA: 06/28/2017 PCP: Eulas Post, MD    Brief Narrative: Dominique Perez is a 84 y.o. female with medical history significant of A fib, CAD S/P PCI 2007, HTN, uterine cancer, who presents with SOB, in a fib and respiratory distress. Most history was obtain from family who is at bedside. Patient develops a productive cough a week ago.  On Friday she was ill, felt sick. Today she was more SOB.  Patient is able to say few words, she is sleepy. She denies chest pain, report SOB, and cough./   ED Course: patient presents with A fib RVR, febrile tempeture 101, WBC 23, Cr 1.8, BNP 1028, Lactic acid 4.5, chest x ray: Right lower lobe airspace disease concerning for atelectasis versus pneumonia. Bilateral interstitial thickening concerning for underlying mild interstitial edema.     Assessment & Plan:   Active Problems:   PAF (paroxysmal atrial fibrillation) (HCC)   Hypertension   CKD (chronic kidney disease) stage 3, GFR 30-59 ml/min (HCC)   Sepsis (HCC)   Healthcare-associated pneumonia   Influenza A   Acute hypoxic respiratory failure, respiratory Distress; PNA- Health care associated. Influenza A positive Related to PNA, A fib.  Discussed with family will try BIPAP (if patient able to tolerates it)  for increase work of breathing, no intubation.  Daughter was ok with Morphine PRN for increase work of breathing, she wants to make sure her mother is comfortable also.  continue with IV antibiotics day 2. Change to zosyn due to worsening WBC>  Xopenex PRN.  Received a dose of solumedrol.  Influenza A positive. Continue with tamiflu.  I think she has the flu with superimpose bacterial PNA>   2-Sepsis; in setting of PNA. And influenza.  Patient present with fever, leukocytosis, lactic acidosis.  She has received 2 L IV bolus in the ED.  Treated with IV vancomycin, cefepime, Doxy (for atypical coverage) on admission.  Will stop vancomycin MRSA PCR negative.  Influenza A positive. Continue with tamiflu.   UA negative , culture pending . Legionella, strep antigen negative  2-A fib RVR;  On  Cardizem Gtt.  Cardiology consulted. Received dose of Dogoxin.  Resume metoprolol.   4-AKI on chronic renal failure stage III;  Cr at 1.5 earlier this month, 1.1 ten month ago.  Suspect related to sepsis, hypoperfusion.  Treated with IV fluids.  Improved, cr decreased to 1.5.---1.6 hold on IV fluids due to concern for Pulmonary edema.   5-GERD; Continue with IV Protonix.       DVT prophylaxis: lovenox.  Code Status: DNR Family Communication: care discussed with daughter . Will have palliative care consult.  Disposition Plan: remain inpatient.    Consultants:   Cardiology    Procedures:   none  Antimicrobials;  Dose of vancomycin.  Cefepime and doxy.  tamiflu   Subjective: She was placed on BIPAP yesterday afternoon again.  Was BIPAP all night.  She is on BIPAP , she is alert,   Came this afternoon and she was off bipap, denies worsening dyspnea. She has been Coughing   Objective: Vitals:   06/15/17 0408 06/15/17 0746 06/15/17 0814 06/15/17 1255  BP:  125/72 (!) 98/42 134/81  Pulse: 88 78 (!) 109 75  Resp: (!) 34 (!) 23 (!) 31 (!) 32  Temp:  (!) 97.2 F (36.2 C)  (!) 97.4 F (36.3 C)  TempSrc:  Oral  Oral  SpO2: 96% 97% 100% 94%  Weight:      Height:        Intake/Output Summary (Last 24 hours) at 06/15/2017 1356 Last data filed at 06/15/2017 0646 Gross per 24 hour  Intake 766.67 ml  Output 150 ml  Net 616.67 ml   Filed Weights   06/21/2017 1424 06/14/17 0500 06/15/17 0351  Weight: 59 kg (130 lb) 60.3 kg (132 lb 15 oz) 60.9 kg (134 lb 4.2 oz)    Examination:  General exam: on BIPAP Respiratory system: Bilateral ronchus.  Cardiovascular system: S 1, S2 IRR Gastrointestinal system: BS present , soft, nt  Central nervous system: Alert  Extremities: Symmetric 5 x 5  power. Skin: No rashes, lesions or ulcers     Data Reviewed: I have personally reviewed following labs and imaging studies  CBC: Recent Labs  Lab 06/16/2017 1259 06/21/2017 1327 06/14/17 0419 06/15/17 0404  WBC 23.5*  --  25.9* 28.9*  NEUTROABS 21.9*  --   --   --   HGB 11.6* 12.6 9.7* 10.1*  HCT 36.8 37.0 31.6* 33.7*  MCV 84.2  --  84.5 84.7  PLT 378  --  268 387   Basic Metabolic Panel: Recent Labs  Lab 06/15/2017 1259 07/07/2017 1327 06/14/17 0419 06/15/17 0404  NA 141 144 141 144  K 4.3 4.2 4.2 4.0  CL 107 110 112* 111  CO2 18*  --  18* 20*  GLUCOSE 147* 156* 217* 150*  BUN 27* 27* 31* 33*  CREATININE 1.85* 1.70* 1.59* 1.64*  CALCIUM 8.5*  --  7.7* 8.4*   GFR: Estimated Creatinine Clearance: 19.6 mL/min (A) (by C-G formula based on SCr of 1.64 mg/dL (H)). Liver Function Tests: Recent Labs  Lab 06/15/2017 1259  AST 36  ALT 15  ALKPHOS 105  BILITOT 0.7  PROT 7.7  ALBUMIN 2.4*   No results for input(s): LIPASE, AMYLASE in the last 168 hours. No results for input(s): AMMONIA in the last 168 hours. Coagulation Profile: No results for input(s): INR, PROTIME in the last 168 hours. Cardiac Enzymes: No results for input(s): CKTOTAL, CKMB, CKMBINDEX, TROPONINI in the last 168 hours. BNP (last 3 results) No results for input(s): PROBNP in the last 8760 hours. HbA1C: No results for input(s): HGBA1C in the last 72 hours. CBG: No results for input(s): GLUCAP in the last 168 hours. Lipid Profile: No results for input(s): CHOL, HDL, LDLCALC, TRIG, CHOLHDL, LDLDIRECT in the last 72 hours. Thyroid Function Tests: No results for input(s): TSH, T4TOTAL, FREET4, T3FREE, THYROIDAB in the last 72 hours. Anemia Panel: No results for input(s): VITAMINB12, FOLATE, FERRITIN, TIBC, IRON, RETICCTPCT in the last 72 hours. Sepsis Labs: Recent Labs  Lab 07/05/2017 1328 06/14/2017 1440 06/25/2017 1822 06/16/2017 2130  PROCALCITON  --   --  3.86  --   LATICACIDVEN 4.17* 4.34* 3.1* 1.5     Recent Results (from the past 240 hour(s))  MRSA PCR Screening     Status: None   Collection Time: 06/14/17  8:40 AM  Result Value Ref Range Status   MRSA by PCR NEGATIVE NEGATIVE Final    Comment:        The GeneXpert MRSA Assay (FDA approved for NASAL specimens only), is one component of a comprehensive MRSA colonization surveillance program. It is not intended to diagnose MRSA infection nor to guide or monitor treatment for MRSA infections. Performed at [facility], [place] [address], [place], [place] [address]          Radiology Studies: No results found.  Scheduled Meds: . enoxaparin (LOVENOX) injection  30 mg Subcutaneous Q24H  . metoprolol tartrate  25 mg Oral BID  . oseltamivir  30 mg Oral Daily  . pantoprazole (PROTONIX) IV  40 mg Intravenous Q24H  . traZODone  25 mg Oral QHS   Continuous Infusions: . diltiazem (CARDIZEM) infusion Stopped (06/15/17 0429)  . doxycycline (VIBRAMYCIN) IV Stopped (06/15/17 0646)  . piperacillin-tazobactam (ZOSYN)  IV 2.25 g (06/15/17 1143)     LOS: 2 days    Time spent: 35 minutes.     Elmarie Shiley, MD [facility] Pager [phone]  If 7PM-7AM, please contact night-coverage [email or web address] Password [email or web address] 06/15/2017, 1:56 PM

## 2017-06-15 NOTE — Progress Notes (Signed)
  Speech Language Pathology Treatment: Dysphagia  Patient Details Name: Dominique Perez MRN: [id number] DOB: 07-22-1926 Today's Date: 06/15/2017 Time: 0092-3300 SLP Time Calculation (min) (ACUTE ONLY): 21 min  Assessment / Plan / Recommendation Clinical Impression  Pt seems to have mildly improved labial seal today as compared to previous date, but she still has prolonged oral transit with very small bites of puree. With Max multimodal cueing and encouragement, and using her daughter as a therapeutic agent, pt only took in one spoonful of thin liquid, with one cough noted although it was quite delayed. Today she appears more restless, uncomfortable in bed, and therefore easily distracted from PO trials. She also is still intermittently requiring BiPAP. Recommend to maintain NPO status except for meds crushed in puree. SLP will continue to follow for more PO readiness.   HPI HPI: Dominique Perez is an 89F with paroxysmal atrial fibrillation, hypertension, hyperlipidemia, CAD s/p PCI, and dementia here with sepsis 2/2 pneumonia and atrial fibrillation with RVR. She had a rapid decline over the last three days and is not currently responsive to questions.Family wishes to avoid any aggressive interventions. They are OK with trying BiPAP.  Pt diagnosed with flu and pna. Daughter denies any difficulty with swallowing prior to admit.       SLP Plan  Continue with current plan of care       Recommendations  Diet recommendations: NPO Medication Administration: Crushed with puree Supervision: Staff to assist with self feeding;Full supervision/cueing for compensatory strategies                Oral Care Recommendations: Oral care QID Follow up Recommendations: Skilled Nursing facility SLP Visit Diagnosis: Dysphagia, unspecified (R13.10) Plan: Continue with current plan of care       GO                Germain Osgood 06/15/2017, 1:48 PM  Germain Osgood, M.A. CCC-SLP [phone]

## 2017-06-15 NOTE — Progress Notes (Addendum)
Pharmacy Antibiotic Note  Dominique Perez is a 84 y.o. female on day # 3 antibiotics for pneumonia. Cefepime changing to Zosyn for HCAP.  Day #2 Tamiflu for + Influenza A.  Now Afebrile, WBC up 28.9,  CKD 3.  Plan:  Zosyn 2.25 gm IV q6hrs.  Follow renal function for any need to adjust regimen.  Continues on Doxycycline 100 mg IV q12hrs  Continues on Tamiflu 30 mg PO daily.  First dose missed on 2/3 d/t on BiPAP, dose given 2/4. Will modify end date prn to try to ensure 5-day course is completed.   Follow up culture data, progress and antibiotic plans.   Height: 5\' 3"  (160 cm) Weight: 134 lb 4.2 oz (60.9 kg) IBW/kg (Calculated) : 52.4  Temp (24hrs), Avg:97.8 F (36.6 C), Min:97.2 F (36.2 C), Max:98.3 F (36.8 C)  Recent Labs  Lab 06/16/2017 1259 07/02/2017 1327 06/24/2017 1328 06/25/2017 1440 07/07/2017 1822 06/21/2017 2130 06/14/17 0419 06/15/17 0404  WBC 23.5*  --   --   --   --   --  25.9* 28.9*  CREATININE 1.85* 1.70*  --   --   --   --  1.59* 1.64*  LATICACIDVEN  --   --  4.17* 4.34* 3.1* 1.5  --   --     Estimated Creatinine Clearance: 19.6 mL/min (A) (by C-G formula based on SCr of 1.64 mg/dL (H)).    Allergies  Allergen Reactions  . Amiodarone Other (See Comments)    tremor  . Erythromycin Stearate Other (See Comments)    REACTION: fever  . Ferrous Sulfate Other (See Comments)    REACTION: fever  . Iohexol Other (See Comments)     Code: HIVES, Desc: Pt states that 10 yrs ago she IV contrast for a CT and broke out in hives and experienced SOB.  Done w/o iv contrast today., Onset Date: [id number]   . Sulfa Antibiotics Hives    Antimicrobials this admission:  Cefepime 2/3>>2/5  Vancomycin 2/3>>2/4  Doxycycline IV 2/3>>  Tamiflu 2/4>>(2/8)  Zosyn 2/5>>  Dose adjustments this admission:   n/a  Microbiology results:  2/3 Influenza A - positive  2/4 urine cx - sent  2/4 Strep pneumo Ag - negative  2/4 Legionella Ag - in process  Thank you for allowing pharmacy to  be a part of this patient's care.  Arty Baumgartner, [place] Pager: [phone] or x [phone] 06/15/2017 10:50 AM

## 2017-06-15 NOTE — Progress Notes (Signed)
Placed back on bipap as sats continue to hang at 90%.  Metoprolol 5 mg IV given per prn dose per prn orders for HR 110-140's.  Sats are improving and HR is 110-125.  Will continue to monitor.

## 2017-06-16 ENCOUNTER — Ambulatory Visit: Payer: Medicare Other | Admitting: Obstetrics & Gynecology

## 2017-06-16 DIAGNOSIS — Z515 Encounter for palliative care: Secondary | ICD-10-CM

## 2017-06-16 DIAGNOSIS — Z7189 Other specified counseling: Secondary | ICD-10-CM

## 2017-06-16 LAB — CBC
HCT: 31.5 % — ABNORMAL LOW (ref 36.0–46.0)
HEMOGLOBIN: 9.7 g/dL — AB (ref 12.0–15.0)
MCH: 26 pg (ref 26.0–34.0)
MCHC: 30.8 g/dL (ref 30.0–36.0)
MCV: 84.5 fL (ref 78.0–100.0)
Platelets: 245 10*3/uL (ref 150–400)
RBC: 3.73 MIL/uL — AB (ref 3.87–5.11)
RDW: 17.6 % — ABNORMAL HIGH (ref 11.5–15.5)
WBC: 15.6 10*3/uL — ABNORMAL HIGH (ref 4.0–10.5)

## 2017-06-16 LAB — BASIC METABOLIC PANEL
Anion gap: 9 (ref 5–15)
BUN: 32 mg/dL — AB (ref 6–20)
CHLORIDE: 111 mmol/L (ref 101–111)
CO2: 22 mmol/L (ref 22–32)
Calcium: 8.3 mg/dL — ABNORMAL LOW (ref 8.9–10.3)
Creatinine, Ser: 1.34 mg/dL — ABNORMAL HIGH (ref 0.44–1.00)
GFR calc Af Amer: 40 mL/min — ABNORMAL LOW (ref >60)
GFR calc non Af Amer: 34 mL/min — ABNORMAL LOW (ref >60)
GLUCOSE: 131 mg/dL — AB (ref 65–99)
POTASSIUM: 4.2 mmol/L (ref 3.5–5.1)
SODIUM: 142 mmol/L (ref 135–145)

## 2017-06-16 LAB — URINE CULTURE: Culture: NO GROWTH

## 2017-06-16 LAB — GLUCOSE, CAPILLARY: Glucose-Capillary: 153 mg/dL — ABNORMAL HIGH (ref 65–99)

## 2017-06-16 MED ORDER — ACETAMINOPHEN 325 MG PO TABS: 650.0000 mg | ORAL_TABLET | Freq: Four times a day (QID) | ORAL | Status: DC | PRN

## 2017-06-16 MED ORDER — MORPHINE BOLUS VIA INFUSION
0.5000 mg | INTRAVENOUS | Status: DC | PRN
  Administered 2017-06-16 – 2017-06-17 (×5): 0.5 mg via INTRAVENOUS
  Filled 2017-06-16: qty 1

## 2017-06-16 MED ORDER — FUROSEMIDE 10 MG/ML IJ SOLN
20.0000 mg | Freq: Once | INTRAMUSCULAR | Status: AC
  Administered 2017-06-16: 20 mg via INTRAVENOUS
  Filled 2017-06-16: qty 2

## 2017-06-16 MED ORDER — GLYCOPYRROLATE 0.2 MG/ML IJ SOLN: 0.2000 mg | INTRAMUSCULAR | Status: DC | PRN

## 2017-06-16 MED ORDER — LORAZEPAM 1 MG PO TABS: 1.0000 mg | ORAL_TABLET | ORAL | Status: DC | PRN

## 2017-06-16 MED ORDER — GLYCOPYRROLATE 1 MG PO TABS: 1.0000 mg | ORAL_TABLET | ORAL | Status: DC | PRN

## 2017-06-16 MED ORDER — LORAZEPAM 2 MG/ML PO CONC: 1.0000 mg | ORAL | Status: DC | PRN

## 2017-06-16 MED ORDER — MORPHINE SULFATE (CONCENTRATE) 10 MG/0.5ML PO SOLN
10.0000 mg | ORAL | Status: DC | PRN
  Administered 2017-06-16: 10 mg via SUBLINGUAL
  Filled 2017-06-16: qty 0.5

## 2017-06-16 MED ORDER — MORPHINE SULFATE 25 MG/ML IV SOLN
2.0000 mg/h | INTRAVENOUS | Status: DC
  Administered 2017-06-16: 1 mg/h via INTRAVENOUS
  Filled 2017-06-16: qty 10

## 2017-06-16 MED ORDER — ACETAMINOPHEN 650 MG RE SUPP: 650.0000 mg | Freq: Four times a day (QID) | RECTAL | Status: DC | PRN

## 2017-06-16 MED ORDER — LORAZEPAM 2 MG/ML PO CONC
1.0000 mg | ORAL | Status: DC | PRN
Start: 2017-06-16 — End: 2017-06-16

## 2017-06-16 MED ORDER — LORAZEPAM 2 MG/ML IJ SOLN
1.0000 mg | INTRAMUSCULAR | Status: DC | PRN
  Filled 2017-06-16: qty 1

## 2017-06-16 MED ORDER — LORAZEPAM 2 MG/ML IJ SOLN
0.5000 mg | Freq: Once | INTRAMUSCULAR | Status: AC
  Administered 2017-06-16: 0.5 mg via INTRAVENOUS
  Filled 2017-06-16: qty 1

## 2017-06-16 NOTE — Progress Notes (Signed)
PROGRESS NOTE    Dominique Perez  LNL:[id number] DOB: 1926/11/06 DOA: 07/02/2017 PCP: Eulas Post, MD    Brief Narrative: Dominique Perez is a 84 y.o. female with medical history significant of A fib, CAD S/P PCI 2007, HTN, uterine cancer, who presents with SOB, in a fib and respiratory distress. Most history was obtain from family who is at bedside. Patient develops a productive cough a week ago.  On Friday she was ill, felt sick. Today she was more SOB.  Patient is able to say few words, she is sleepy. She denies chest pain, report SOB, and cough./   ED Course: patient presents with A fib RVR, febrile tempeture 101, WBC 23, Cr 1.8, BNP 1028, Lactic acid 4.5, chest x ray: Right lower lobe airspace disease concerning for atelectasis versus pneumonia. Bilateral interstitial thickening concerning for underlying mild interstitial edema.  Family met with palliative care team, patient continue to need BIPAP, WBC has decreased and renal function has stabilized, family has notice that patient has decline over time, dementia has been getting worse. Dominique Perez wouldn't want to go to SNF, and would want comfort care. Patient will be transition to comfort care.    Assessment & Plan:   Active Problems:   PAF (paroxysmal atrial fibrillation) (HCC)   Hypertension   CKD (chronic kidney disease) stage 3, GFR 30-59 ml/min (HCC)   Sepsis (HCC)   Healthcare-associated pneumonia   Influenza A   Acute hypoxic respiratory failure, respiratory Distress; PNA- Health care associated. Influenza A positive Related to PNA, A fib. Influenza.  Discussed with family will try BIPAP (if patient able to tolerates it)  for increase work of breathing, no intubation.  Daughter was ok with Morphine PRN for increase work of breathing, she wants to make sure her mother is comfortable also.  continue with IV antibiotics day 3. Change to zosyn due to worsening WBC>  Xopenex PRN.  Received a dose of solumedrol.    Influenza A positive. Continue with tamiflu. Has not received consistently due to NPO status.  I think she has the flu with superimpose bacterial PNA>  Family met with palliative care team, patient continue to need BIPAP, WBC has decreased and renal function has stabilized, family has notice that patient has decline over time, dementia has been getting worse. Dominique Perez wouldn't want to go to SNF, and would want comfort care. Patient will be transition to comfort care.  -will give one time dose of lasix to help with dyspnea.   2-Sepsis; in setting of PNA. And influenza.  Patient present with fever, leukocytosis, lactic acidosis.  She has received 2 L IV bolus in the ED.  Treated with IV vancomycin, cefepime, Doxy (for atypical coverage) on admission.  stop vancomycin MRSA PCR negative.  Influenza A positive. Continue with tamiflu.  UA negative , culture pending . Legionella, strep antigen negative  2-A fib RVR;  On  Cardizem Gtt.  Cardiology consulted. Received dose of Dogoxin.  On metoprolol.   4-AKI on chronic renal failure stage III;  Cr at 1.5 earlier this month, 1.1 ten month ago.  Suspect related to sepsis, hypoperfusion.  Treated with IV fluids.  Improved, cr decreased to 1.5.---1.6--1.3 hold on IV fluids due to concern for Pulmonary edema.   5-GERD; Continue with IV Protonix.    DVT prophylaxis: lovenox.  Code Status: DNR Family Communication: care discussed with daughter . Disposition Plan: remain inpatient.    Consultants:   Cardiology    Procedures:  none  Antimicrobials;  Dose of vancomycin.  Cefepime and doxy.  tamiflu   Subjective: Patient is on BIPAP, appears uncomfortable;e.   Objective: Vitals:   06/16/17 0623 06/16/17 0822 06/16/17 0823 06/16/17 0829  BP:  (!) 143/85 (!) 143/85 (!) 143/85  Pulse:  87 98 68  Resp:  (!) 22 (!) 25 (!) 29  Temp:    (!) 97.2 F (36.2 C)  TempSrc:    Axillary  SpO2:  98% 100% 99%  Weight: 58.9 kg (129 lb  13.6 oz)     Height:        Intake/Output Summary (Last 24 hours) at 06/16/2017 0931 Last data filed at 06/16/2017 6789 Gross per 24 hour  Intake 412.58 ml  Output 200 ml  Net 212.58 ml   Filed Weights   06/14/17 0500 06/15/17 0351 06/16/17 0623  Weight: 60.3 kg (132 lb 15 oz) 60.9 kg (134 lb 4.2 oz) 58.9 kg (129 lb 13.6 oz)    Examination:  General exam: on BIPAP Respiratory system: Bilateral ronchus.  Cardiovascular system: S 1, S 2 IRR Gastrointestinal system: BS present,  Central nervous system:  Alert on BIPAP Extremities: no edema       Data Reviewed: I have personally reviewed following labs and imaging studies  CBC: Recent Labs  Lab 06/25/2017 1259 06/21/2017 1327 06/14/17 0419 06/15/17 0404 06/16/17 0315  WBC 23.5*  --  25.9* 28.9* 15.6*  NEUTROABS 21.9*  --   --   --   --   HGB 11.6* 12.6 9.7* 10.1* 9.7*  HCT 36.8 37.0 31.6* 33.7* 31.5*  MCV 84.2  --  84.5 84.7 84.5  PLT 378  --  268 292 381   Basic Metabolic Panel: Recent Labs  Lab 06/12/2017 1259 06/25/2017 1327 06/14/17 0419 06/15/17 0404 06/16/17 0315  NA 141 144 141 144 142  K 4.3 4.2 4.2 4.0 4.2  CL 107 110 112* 111 111  CO2 18*  --  18* 20* 22  GLUCOSE 147* 156* 217* 150* 131*  BUN 27* 27* 31* 33* 32*  CREATININE 1.85* 1.70* 1.59* 1.64* 1.34*  CALCIUM 8.5*  --  7.7* 8.4* 8.3*   GFR: Estimated Creatinine Clearance: 24 mL/min (A) (by C-G formula based on SCr of 1.34 mg/dL (H)). Liver Function Tests: Recent Labs  Lab 06/15/2017 1259  AST 36  ALT 15  ALKPHOS 105  BILITOT 0.7  PROT 7.7  ALBUMIN 2.4*   No results for input(s): LIPASE, AMYLASE in the last 168 hours. No results for input(s): AMMONIA in the last 168 hours. Coagulation Profile: No results for input(s): INR, PROTIME in the last 168 hours. Cardiac Enzymes: No results for input(s): CKTOTAL, CKMB, CKMBINDEX, TROPONINI in the last 168 hours. BNP (last 3 results) No results for input(s): PROBNP in the last 8760 hours. HbA1C: No  results for input(s): HGBA1C in the last 72 hours. CBG: Recent Labs  Lab 06/16/17 0826  GLUCAP 153*   Lipid Profile: No results for input(s): CHOL, HDL, LDLCALC, TRIG, CHOLHDL, LDLDIRECT in the last 72 hours. Thyroid Function Tests: No results for input(s): TSH, T4TOTAL, FREET4, T3FREE, THYROIDAB in the last 72 hours. Anemia Panel: No results for input(s): VITAMINB12, FOLATE, FERRITIN, TIBC, IRON, RETICCTPCT in the last 72 hours. Sepsis Labs: Recent Labs  Lab 06/29/2017 1328 06/23/2017 1440 06/30/2017 1822 07/04/2017 2130  PROCALCITON  --   --  3.86  --   LATICACIDVEN 4.17* 4.34* 3.1* 1.5    Recent Results (from the past 240 hour(s))  MRSA PCR  Screening     Status: None   Collection Time: 06/14/17  8:40 AM  Result Value Ref Range Status   MRSA by PCR NEGATIVE NEGATIVE Final    Comment:        The GeneXpert MRSA Assay (FDA approved for NASAL specimens only), is one component of a comprehensive MRSA colonization surveillance program. It is not intended to diagnose MRSA infection nor to guide or monitor treatment for MRSA infections. Performed at [facility], [place] [address], [place], [place] [address]   Urine Culture     Status: None   Collection Time: 06/14/17  7:03 PM  Result Value Ref Range Status   Specimen Description URINE, RANDOM  Final   Special Requests NONE  Final   Culture   Final    NO GROWTH Performed at [facility], [place] [address] [place], [place] [address]    Report Status 06/16/2017 FINAL  Final         Radiology Studies: No results found.      Scheduled Meds: . enoxaparin (LOVENOX) injection  30 mg Subcutaneous Q24H  . furosemide  20 mg Intravenous Once  . metoprolol tartrate  25 mg Oral BID  . oseltamivir  30 mg Oral Daily  . pantoprazole (PROTONIX) IV  40 mg Intravenous Q24H  . traZODone  25 mg Oral QHS   Continuous Infusions: . diltiazem (CARDIZEM) infusion 7.5 mg/hr (06/15/17 2232)  . doxycycline (VIBRAMYCIN) IV 100 mg  (06/16/17 0458)  . piperacillin-tazobactam (ZOSYN)  IV Stopped (06/15/17 2358)     LOS: 3 days    Time spent: 35 minutes.     Elmarie Shiley, MD [facility] Pager [phone]  If 7PM-7AM, please contact night-coverage [email or web address] Password [facility] 06/16/2017, 9:31 AM

## 2017-06-16 NOTE — Progress Notes (Signed)
Daily Progress Note   Patient Name: Dominique Perez       Date: 06/16/2017 DOB: Aneesha 14, 1928  Age: 84 y.o. MRN#: [id number] Attending Physician: Elmarie Shiley, MD Primary Care Physician: Eulas Post, MD Admit Date: 06/29/2017  Reason for Consultation/Follow-up: Establishing goals of care  Subjective: Evaluated patient at bedside. She awakens to my voice. Nods yes to pain- indicates pain in her abdomen-per nursing has white discharge from navel and vagina. Requests mitts be removed. She is calm when mitts are removed. Bipap removed- she quickly has increased RR and HR with bipap break.   Met with patient's daughters Threasa Beards and Caryl Asp.  I introduced Palliative Medicine as specialized medical care for people living with serious illness. It focuses on providing relief from the symptoms and stress of a serious illness. The goal is to improve quality of life for both the patient and the family.  We discussed a brief life review of the patient. She worked in Psychologist, educational for [employer]. She was married. Her husband died a few years ago. Being independent was always very important to her. She wanted her EOL to be calm and peaceful. She did not want to live in a skilled facility. She had been angry to be moved to [facility]- she declined very quickly once moved there.   As far as functional and nutritional status- her family has noted a definite decline in the last few months. She had lost a significant amount of weight in the last year and had developed postmenopausal bleeding. Her obgyn had done an endomentrial./cervical biopsy that was suspicious for malignancy. Further workup was declined due to they would not pursue treatment due to patient's progressing dementia. They noted she would have  frequent pain in her abdomen. She had become incontinent of urine and stool. She was no longer able to recognizer her daughters. She walked with assistance of a walker. Her daughter's felt her quality of life was declining. She used to enjoy reading, but now was unable to read- she would pretend to read, but the reader's digest would be upside down. She spent most of her days sitting in her chair.    We discussed their current illness and what it means in the larger context of their on-going co-morbidities.  Natural disease trajectory and expectations at EOL were discussed.  Joy and Threasa Beards note that even if patient survives this hospitalization she would require discharge to a skilled facility and their mother would not want that. We discussed that if Leland were sitting with [place] in her prime, on her best day, she would tell her daughter's to make her comfortable and let her go be with her husband, Chappy.   The difference between aggressive medical intervention and comfort care was explained and considered in light of the patient's goals of care. Threasa Beards and Caryl Asp are certain that they do not want to continue aggressive medical care. We discussed that comfort care means continuing only medications indicated for comfort. They request residential hospice placement.  Advanced directives, concepts specific to code status, artifical feeding and hydration, and rehospitalization were considered and discussed. They note their mother would not want artificial feeding or hydration at end of life. We discussed the role of comfort feeding and swabbing.   Hospice services outpatient were explained and offered.  Questions and concerns were addressed.  Hard Choices booklet left for review. The family was encouraged to call with questions or concerns.    Review of Systems  Unable to perform ROS: Dementia    Length of Stay: 3  Current Medications: Scheduled Meds:  . enoxaparin (LOVENOX) injection  30 mg Subcutaneous  Q24H  . furosemide  20 mg Intravenous Once  . metoprolol tartrate  25 mg Oral BID  . oseltamivir  30 mg Oral Daily  . pantoprazole (PROTONIX) IV  40 mg Intravenous Q24H  . traZODone  25 mg Oral QHS    Continuous Infusions: . diltiazem (CARDIZEM) infusion 7.5 mg/hr (06/15/17 2232)  . doxycycline (VIBRAMYCIN) IV 100 mg (06/16/17 0458)  . piperacillin-tazobactam (ZOSYN)  IV Stopped (06/15/17 2358)    PRN Meds: acetaminophen **OR** acetaminophen, levalbuterol, morphine injection, ondansetron **OR** ondansetron (ZOFRAN) IV  Physical Exam  Constitutional:  frail  HENT:  White patches on tongue  Cardiovascular:  Tachycardic, irregular  Pulmonary/Chest:  Increased RR, on bipap, rate increases quickly off bipap  Abdominal: Soft. Bowel sounds are normal.  Neurological:  Disoriented, follows commands, anxious  Skin:  flushed  Nursing note and vitals reviewed.           Vital Signs: BP (!) 143/85 (BP Location: Right Arm)   Pulse 68   Temp (!) 97.2 F (36.2 C) (Axillary)   Resp (!) 29   Ht '5\' 3"'  (1.6 m)   Wt 58.9 kg (129 lb 13.6 oz)   SpO2 99%   BMI 23.00 kg/m  SpO2: SpO2: 99 % O2 Device: O2 Device: Bi-PAP O2 Flow Rate: O2 Flow Rate (L/min): 15 L/min  Intake/output summary:   Intake/Output Summary (Last 24 hours) at 06/16/2017 2637 Last data filed at 06/16/2017 8588 Gross per 24 hour  Intake 412.58 ml  Output 200 ml  Net 212.58 ml   LBM: Last BM Date: 06/14/17 Baseline Weight: Weight: 59 kg (130 lb) Most recent weight: Weight: 58.9 kg (129 lb 13.6 oz)       Palliative Assessment/Data: PPS: 10%   Flowsheet Rows     Most Recent Value  Intake Tab  Referral Department  Hospitalist  Unit at Time of Referral  Med/Surg Unit  Palliative Care Primary Diagnosis  Nephrology  Date Notified  06/15/17  Palliative Care Type  New Palliative care  Reason for referral  Clarify Goals of Care  Date of Admission  07/03/2017  Date first seen by Palliative Care  06/15/17  # of days  Palliative referral response time  0 Day(s)  # of days IP prior to Palliative referral  2  Clinical Assessment  Psychosocial & Spiritual Assessment  Palliative Care Outcomes      Patient Active Problem List   Diagnosis Date Noted  . Influenza A   . Sepsis ([place]) 06/26/2017  . Healthcare-associated pneumonia 07/07/2017  . Dementia 02/08/2017  . Memory loss 03/23/2016  . Atrial fibrillation with rapid ventricular response ([place]) 12/17/2015  . Atrial fibrillation with RVR ([place]) 12/17/2015  . Atrial flutter ([place]) 04/13/2014  . Chronic anticoagulation 03/27/2014  . Osteoarthritis of multiple joints 09/04/2011  . CKD (chronic kidney disease) stage 3, GFR 30-59 ml/min (HCC) 09/04/2011  . Bradycardia 02/17/2011  . PAF (paroxysmal atrial fibrillation) ([place])   . Hypertension   . Leg pain, right   . Arthritis   . GERD (gastroesophageal reflux disease)   . Hyperlipidemia   . Gallstones   . ASTEATOTIC ECZEMA 06/18/2009  . CAD S/P percutaneous coronary angioplasty 12/20/2008  . ASTHMA 12/20/2008  . GERD 12/20/2008  . DECUBITUS ULCER, BUTTOCK 12/20/2008  . COUGH, CHRONIC 12/20/2008    Palliative Care Assessment & Plan   Patient Profile:  84 y.o. female  with past medical history of dementia,  A. Fib, CAD, HTN, uterine CA, presents w/ cough congestion x 1 week, worsening SOB, workup reveals interstitial edema, sepsis, influenza, pneumonia, respiratory failuire requiring bipap, admitted on 06/29/2017 for management. During admission WBC has continued to increase despite IV fluids, and antibiotics, she has continued to require intermittent bipap. Kidney function remains stable but has not improved. Per discussion with attending team patient's daughter has requested  DNR, DNI, no pressers. SLP has recommended NPO status. She has been started on prn morphine for some comfort. Palliative medicine consulted for ongoing [place] as patient showing no signs of improvement.      Assessment/Recommendations/Plan   Transition to comfort care  D/C bipap after morphine infusion is started  D/C other life prolonging medications  Morphine infusion 7m/hr with .5639mbolus q15 min prn for increased RR, anxiety  Lorazepam 39m47mV, or sublingual every 4 hours prn anxiety  Robinul .2mg32m q4 hr prn excessive secretions  Social work referral for residential hospice placement  Goals of Care and Additional Recommendations:  Limitations on Scope of Treatment: Full Comfort Care  Code Status:  DNR  Prognosis:   < 2 weeks  Discharge Planning:  Hospice facility  Care plan was discussed with patient's daughters, Dr. RegaTyrell Antoniotient's RN- MichSharyn Lullank you for allowing the Palliative Medicine Team to assist in the care of this patient.   Time In: 0830 Time Out: 1015 Total Time 105 mins Prolonged Time Billed Yes      Greater than 50%  of this time was spent counseling and coordinating care related to the above assessment and plan.  KasiMariana KaufmanNP-C Palliative Medicine   Please contact Palliative Medicine Team phone at [phone] questions and concerns.

## 2017-06-16 NOTE — Progress Notes (Signed)
CSW received consult for residential hospice and family preference for [place] made referral to [facility] liaison for review  CSW will continue to follow  Jorge Ny, [place] Social Worker [phone]

## 2017-06-16 NOTE — Progress Notes (Signed)
Nutrition Brief Note  Pt identified on the Malnutrition Screening Tool Report. Pt now transitioning to comfort care.  No nutrition interventions warranted at this time.  Please consult as needed.   Arthur Holms, RD, LDN Pager #: [phone] After-Hours Pager #: [phone]

## 2017-06-16 NOTE — Progress Notes (Signed)
Palliative NP spoke with family regarding care goals as did Dr. Tyrell Antonio and family decided to make pt comfort care. All medications discontinued and pt will be started on a Morphine gtt.

## 2017-06-17 DIAGNOSIS — Z7189 Other specified counseling: Secondary | ICD-10-CM

## 2017-06-17 DIAGNOSIS — Z515 Encounter for palliative care: Secondary | ICD-10-CM

## 2017-06-17 MED ORDER — MORPHINE BOLUS VIA INFUSION
1.0000 mg | INTRAVENOUS | Status: DC | PRN
  Filled 2017-06-17: qty 1

## 2017-06-17 MED ORDER — MORPHINE BOLUS VIA INFUSION
2.0000 mg | INTRAVENOUS | Status: DC | PRN
  Filled 2017-06-17: qty 2

## 2017-06-17 MED ORDER — MORPHINE BOLUS VIA INFUSION
1.0000 mg | INTRAVENOUS | Status: DC | PRN
  Administered 2017-06-17: 1 mg via INTRAVENOUS

## 2017-06-17 NOTE — Progress Notes (Signed)
Per Dr Denton Brick advised asking family if they would like patient on humidified oxygen [place] to increase comfort. Family agreed. Patient on 2L o2.

## 2017-06-17 NOTE — Progress Notes (Signed)
Daughter Threasa Beards was at bedside up until approximately 8:45pm.  Patient appears peaceful at this time. Patient is no longer responding to verbal stimuli or painful stimuli.  Respirations shallow, bilat lower extremities cool to the touch, bilat upper extremities cool to the touch.  Foley remains intact draining clear amber urine.  Will monitor patient frequently and provide mouth care as needed.

## 2017-06-17 NOTE — Progress Notes (Signed)
Patient Demographics:    Dominique Perez, is a 84 y.o. female, DOB - 1926/05/15, JSE:[id number]  Admit date - 06/24/2017   Admitting Physician Elmarie Shiley, MD  Outpatient Primary MD for the patient is Eulas Post, MD  LOS - 4   No chief complaint on file.       Subjective:    Dominique Perez today has no new concerns, daughter at bedside, patient is lethargic, appears peaceful, patient is actively dying  Assessment  & Plan :    Active Problems:   PAF (paroxysmal atrial fibrillation) (HCC)   Hypertension   CKD (chronic kidney disease) stage 3, GFR 30-59 ml/min (HCC)   Sepsis ([place])   Healthcare-associated pneumonia   Influenza A   Goals of care, counseling/discussion   Palliative care by specialist   Terminal care   1) acute hypoxic respiratory failure secondary to pneumonia and influenza A-patient is actively dying, continue comfort measures    Code Status : DNR   Disposition Plan  : Patient is actively dying  Consults  : Neurology/palliative care    Lab Results  Component Value Date   PLT 245 06/16/2017    Inpatient Medications  Scheduled Meds: . pantoprazole (PROTONIX) IV  40 mg Intravenous Q24H   Continuous Infusions: . morphine 2 mg/hr (06/17/17 1010)   PRN Meds:.acetaminophen **OR** acetaminophen, glycopyrrolate **OR** glycopyrrolate **OR** glycopyrrolate, LORazepam, LORazepam **OR** [DISCONTINUED] LORazepam **OR** LORazepam, morphine, morphine CONCENTRATE    Anti-infectives (From admission, onward)   Start     Dose/Rate Route Frequency Ordered Stop   06/15/17 1500  vancomycin (VANCOCIN) IVPB 750 mg/150 ml premix  Status:  Discontinued     750 mg 150 mL/hr over 60 Minutes Intravenous Every 48 hours 06/19/2017 1439 06/14/17 1219   06/15/17 1130  piperacillin-tazobactam (ZOSYN) IVPB 2.25 g  Status:  Discontinued     2.25 g 100 mL/hr over 30 Minutes Intravenous  Every 6 hours 06/15/17 1048 06/16/17 1007   06/14/17 1500  ceFEPIme (MAXIPIME) 1 g in dextrose 5 % 50 mL IVPB  Status:  Discontinued     1 g 100 mL/hr over 30 Minutes Intravenous Every 24 hours 06/20/2017 1439 06/15/17 1022   07/03/2017 2200  oseltamivir (TAMIFLU) capsule 75 mg  Status:  Discontinued     75 mg Oral 2 times daily 06/17/2017 1636 06/16/2017 1639   06/25/2017 1800  oseltamivir (TAMIFLU) capsule 30 mg  Status:  Discontinued     30 mg Oral Daily 06/28/2017 1639 06/16/17 1007   06/16/2017 1700  doxycycline (VIBRAMYCIN) 100 mg in dextrose 5 % 250 mL IVPB  Status:  Discontinued     100 mg 125 mL/hr over 120 Minutes Intravenous Every 12 hours 06/27/2017 1657 06/16/17 1007   07/04/2017 1430  ceFEPIme (MAXIPIME) 2 g in dextrose 5 % 50 mL IVPB     2 g 100 mL/hr over 30 Minutes Intravenous  Once 07/03/2017 1416 06/27/2017 1639   06/21/2017 1430  vancomycin (VANCOCIN) IVPB 1000 mg/200 mL premix     1,000 mg 200 mL/hr over 60 Minutes Intravenous  Once 07/04/2017 1416 06/24/2017 1552        Objective:   Vitals:   06/17/17 1500 06/17/17 1600 06/17/17 1700 06/17/17 1800  BP:  Pulse: 66 (!) 132 (!) 42 (!) 31  Resp: 18 15 (!) 21 10  Temp:      TempSrc:      SpO2: 98% 97% 98% 97%  Weight:      Height:        Wt Readings from Last 3 Encounters:  06/16/17 58.9 kg (129 lb 13.6 oz)  05/12/17 63.1 kg (139 lb 1.6 oz)  03/02/17 62.6 kg (138 lb)     Intake/Output Summary (Last 24 hours) at 06/17/2017 1952 Last data filed at 06/16/2017 2023 Gross per 24 hour  Intake -  Output 200 ml  Net -200 ml     Physical Exam  Gen:-Lethargic, appears comfortable HEENT:- [place].AT, No sclera icterus Neck-Supple Neck,No JVD,.  Lungs-diminished in bases CV- S1, S2 normal Abd-  +ve B.Sounds, Abd Soft, No tenderness,    Extremity/Skin:- warm Psych/Neuro-sleepy, peaceful   Data Review:   Micro Results Recent Results (from the past 240 hour(s))  MRSA PCR Screening     Status: None   Collection Time: 06/14/17  8:40  AM  Result Value Ref Range Status   MRSA by PCR NEGATIVE NEGATIVE Final    Comment:        The GeneXpert MRSA Assay (FDA approved for NASAL specimens only), is one component of a comprehensive MRSA colonization surveillance program. It is not intended to diagnose MRSA infection nor to guide or monitor treatment for MRSA infections. Performed at [facility], [employer] [address] [place], [place] [address]   Urine Culture     Status: None   Collection Time: 06/14/17  7:03 PM  Result Value Ref Range Status   Specimen Description URINE, RANDOM  Final   Special Requests NONE  Final   Culture   Final    NO GROWTH Performed at [facility], [place] [address], [place],  [address]    Report Status 06/16/2017 FINAL  Final    Radiology Reports Dg Chest Port 1 View  Result Date: 07/01/2017 CLINICAL DATA:  Shortness of breath EXAM: PORTABLE CHEST 1 VIEW COMPARISON:  12/17/2015 FINDINGS: There is bilateral interstitial thickening. There is right lower lobe airspace disease. Bilateral costophrenic angles are excluded from the field of view. There prominence of the central pulmonary vasculature. The heart mediastinum are stable. There is no acute osseous abnormality. There is moderate osteoarthritis of bilateral glenohumeral joints. IMPRESSION: Right lower lobe airspace disease concerning for atelectasis versus pneumonia. Bilateral interstitial thickening concerning for underlying mild interstitial edema. Electronically Signed   By: Kathreen Devoid   On: 06/22/2017 14:06     CBC Recent Labs  Lab 06/17/2017 1259 07/06/2017 1327 06/14/17 0419 06/15/17 0404 06/16/17 0315  WBC 23.5*  --  25.9* 28.9* 15.6*  HGB 11.6* 12.6 9.7* 10.1* 9.7*  HCT 36.8 37.0 31.6* 33.7* 31.5*  PLT 378  --  268 292 245  MCV 84.2  --  84.5 84.7 84.5  MCH 26.5  --  25.9* 25.4* 26.0  MCHC 31.5  --  30.7 30.0 30.8  RDW 17.5*  --  17.6* 17.5* 17.6*  LYMPHSABS 0.7  --   --   --   --   MONOABS 0.9  --   --   --    --   EOSABS 0.0  --   --   --   --   BASOSABS 0.0  --   --   --   --     Chemistries  Recent Labs  Lab 07/01/2017 1259  06/11/2017 1327 06/14/17 0419 06/15/17 0404 06/16/17 0315  NA 141 144 141 144 142  K 4.3 4.2 4.2 4.0 4.2  CL 107 110 112* 111 111  CO2 18*  --  18* 20* 22  GLUCOSE 147* 156* 217* 150* 131*  BUN 27* 27* 31* 33* 32*  CREATININE 1.85* 1.70* 1.59* 1.64* 1.34*  CALCIUM 8.5*  --  7.7* 8.4* 8.3*  AST 36  --   --   --   --   ALT 15  --   --   --   --   ALKPHOS 105  --   --   --   --   BILITOT 0.7  --   --   --   --    ------------------------------------------------------------------------------------------------------------------ No results for input(s): CHOL, HDL, LDLCALC, TRIG, CHOLHDL, LDLDIRECT in the last 72 hours.  No results found for: HGBA1C ------------------------------------------------------------------------------------------------------------------ No results for input(s): TSH, T4TOTAL, T3FREE, THYROIDAB in the last 72 hours.  Invalid input(s): FREET3 ------------------------------------------------------------------------------------------------------------------ No results for input(s): VITAMINB12, FOLATE, FERRITIN, TIBC, IRON, RETICCTPCT in the last 72 hours.  Coagulation profile No results for input(s): INR, PROTIME in the last 168 hours.  No results for input(s): DDIMER in the last 72 hours.  Cardiac Enzymes No results for input(s): CKMB, TROPONINI, MYOGLOBIN in the last 168 hours.  Invalid input(s): CK ------------------------------------------------------------------------------------------------------------------    Component Value Date/Time   BNP 1,028.4 (H) 07/03/2017 1416     Terique Kawabata M.D on 06/17/2017 at 7:52 PM  Between 7am to 7pm - Pager - [phone]  After 7pm go to [email or web address] - password [email or web address]  [facility] -  Office  [phone]   Voice Recognition Viviann Spare dictation system was used to create this note,  attempts have been made to correct errors. Please contact the author with questions and/or clarifications.

## 2017-06-17 NOTE — Progress Notes (Signed)
CSW informed that [facility] does not have availability for this patient today- shortly after CSW informed by palliative team that patient appears to be actively dying patient would not be stable to transfer to outside facility  CSW signing off- please reconsult if needed  Jorge Ny, [place] Social Worker [phone]

## 2017-06-17 NOTE — Progress Notes (Signed)
Chart reviewed and events noted- will sign-off. Call with questions.  Pixie Casino, MD, [facility], [place] Director of the [facility] Lipid Disorders &  Cardiovascular Risk Reduction Clinic Diplomate of the American Board of Clinical Lipidology Attending Cardiologist  Direct Dial: [phone]  Fax: [phone]  Website:  [email or web address]

## 2017-06-17 NOTE — Progress Notes (Signed)
Daily Progress Note   Patient Name: Dominique Perez       Date: 06/17/2017 DOB: 1927/04/20  Age: 84 y.o. MRN#: [id number] Attending Physician: Roxan Hockey, MD Primary Care Physician: Eulas Post, MD Admit Date: 06/21/2017  Reason for Consultation/Follow-up: Establishing goals of care  Subjective: Patient in bed. Appears to have declined overnight. Daughter-Joy at bedside. Joy expresses concern about patient's stability for transfer and I agree. Give Joy emotional support as she reminisces about her mom. Review with Joy signs of discomfort and encourage her to request PRN medications from nursing staff.    Review of Systems  Unable to perform ROS: Acuity of condition    Length of Stay: 4  Current Medications: Scheduled Meds:  . pantoprazole (PROTONIX) IV  40 mg Intravenous Q24H    Continuous Infusions: . morphine 1 mg/hr (06/16/17 1603)    PRN Meds: acetaminophen **OR** acetaminophen, glycopyrrolate **OR** glycopyrrolate **OR** glycopyrrolate, LORazepam, LORazepam **OR** [DISCONTINUED] LORazepam **OR** LORazepam, morphine, morphine CONCENTRATE  Physical Exam  Constitutional:  Frail, ill appearing, appears uncomforatable  Cardiovascular:  Irregular beat, peripheral pulses weak  Pulmonary/Chest: She has rales.  Terminal secretions, increased RR, accessory muscle usage  Genitourinary:  Genitourinary Comments: Foley in place, clear urine output  Neurological:  Wakes briefly to voice, no verbal response, disoriented  Skin:  Flushed, mottling in extremities, hands and feet cold  Nursing note reviewed.           Vital Signs: BP (!) 143/85 (BP Location: Right Arm)   Pulse 68   Temp (!) 97.2 F (36.2 C) (Axillary)   Resp (!) 29   Ht 5\' 3"  (1.6 m)   Wt 58.9 kg (129 lb  13.6 oz)   SpO2 99%   BMI 23.00 kg/m  SpO2: SpO2: 99 % O2 Device: O2 Device: Bi-PAP O2 Flow Rate: O2 Flow Rate (L/min): 15 L/min  Intake/output summary:   Intake/Output Summary (Last 24 hours) at 06/17/2017 1011 Last data filed at 06/16/2017 2023 Gross per 24 hour  Intake 39.7 ml  Output 200 ml  Net -160.3 ml   LBM: Last BM Date: 06/14/17 Baseline Weight: Weight: 59 kg (130 lb) Most recent weight: Weight: 58.9 kg (129 lb 13.6 oz)       Palliative Assessment/Data: PPS: 10%    Flowsheet Rows     Most  Recent Value  Intake Tab  Referral Department  Hospitalist  Unit at Time of Referral  Med/Surg Unit  Palliative Care Primary Diagnosis  Nephrology  Date Notified  06/15/17  Palliative Care Type  New Palliative care  Reason for referral  Clarify Goals of Care  Date of Admission  06/17/2017  Date first seen by Palliative Care  06/15/17  # of days Palliative referral response time  0 Day(s)  # of days IP prior to Palliative referral  2  Clinical Assessment  Psychosocial & Spiritual Assessment  Palliative Care Outcomes      Patient Active Problem List   Diagnosis Date Noted  . Goals of care, counseling/discussion   . Palliative care by specialist   . Influenza A   . Sepsis ([place]) 06/12/2017  . Healthcare-associated pneumonia 06/29/2017  . Dementia 02/08/2017  . Memory loss 03/23/2016  . Atrial fibrillation with rapid ventricular response ([place]) 12/17/2015  . Atrial fibrillation with RVR ([place]) 12/17/2015  . Atrial flutter ([place]) 04/13/2014  . Chronic anticoagulation 03/27/2014  . Osteoarthritis of multiple joints 09/04/2011  . CKD (chronic kidney disease) stage 3, GFR 30-59 ml/min (HCC) 09/04/2011  . Bradycardia 02/17/2011  . PAF (paroxysmal atrial fibrillation) ([place])   . Hypertension   . Leg pain, right   . Arthritis   . GERD (gastroesophageal reflux disease)   . Hyperlipidemia   . Gallstones   . ASTEATOTIC ECZEMA 06/18/2009  . CAD S/P percutaneous coronary  angioplasty 12/20/2008  . ASTHMA 12/20/2008  . GERD 12/20/2008  . DECUBITUS ULCER, BUTTOCK 12/20/2008  . COUGH, CHRONIC 12/20/2008    Palliative Care Assessment & Plan   Patient Profile:  84 y.o. female  with past medical history of dementia,  A. Fib, CAD, HTN, uterine CA, presents w/ cough congestion x 1 week, worsening SOB, workup reveals interstitial edema, sepsis, influenza, pneumonia, respiratory failuire requiring bipap, admitted on 06/17/2017 for management. During admission WBC has continued to increase despite IV fluids, and antibiotics, she has continued to require intermittent bipap. Kidney function remains stable but has not improved. Per discussion with attending team patient's daughter has requested  DNR, DNI, no pressers. SLP has recommended NPO status. She has been started on prn morphine for some comfort. Palliative medicine consulted for ongoing [place] as patient showing no signs of improvement. Patient was transitioned to comfort care on 2/7.     Assessment/Recommendations/Plan   Patient is actively dying.  Patient is not safe for transfer- DO NOT TRANSFER.  Increase morphine bolus to 1mg  q15 min prn  Increase morphine continuous infusion to 2mg /hr  Cancel request for [employer] of Care and Additional Recommendations:  Limitations on Scope of Treatment: Full Comfort Care  Code Status:  DNR  Prognosis:   Hours - Days  Discharge Planning:  Anticipated Hospital Death  Care plan was discussed with patient's daughter, patient's RN, social work- Environmental consultant.  Thank you for allowing the Palliative Medicine Team to assist in the care of this patient.   Time In: 0945 Time Out: 1025 Total Time 40 mins Prolonged Time Billed no      Greater than 50%  of this time was spent counseling and coordinating care related to the above assessment and plan.  Mariana Kaufman, AGNP-C Palliative Medicine   Please contact Palliative Medicine Team phone at [phone] for  questions and concerns.

## 2017-06-17 NOTE — Care Management Note (Addendum)
Case Management Note  Patient Details  Name: Dominique Perez MRN: [id number] Date of Birth: 05/09/1927  Subjective/Objective:  Patient is actively expiring, [facility] no beds.  2/8 Patient expired.                   Action/Plan: Expect to expire in hospital.  Expected Discharge Date:                  Expected Discharge Plan:  [place]  In-House Referral:  Clinical Social Work  Discharge planning Services     Post Acute Care Choice:    Choice offered to:     DME Arranged:    DME Agency:     HH Arranged:    [place] Agency:     Status of Service:  Completed, signed off  If discussed at [employer], dates discussed:    Additional Comments:  Zenon Mayo, RN 06/17/2017, 4:32 PM

## 2017-07-09 NOTE — Progress Notes (Signed)
Applied warm blanket, patient remains cold to touch. Daughters are at bedside. Per pallative and family wishes O2 [place] discontinued. Will continue to monitor patient and offer comfort to family.

## 2017-07-09 NOTE — Discharge Summary (Signed)
Death Summary  Dominique Perez FKC:[id number] DOB: [birth year] DOA: Jun 22, 2017  PCP: Eulas Post, MD  Admit date: 06-22-2017 Date of Death: 2017/06/27 at 53 PM  Final Diagnoses:  Active Problems:   PAF (paroxysmal atrial fibrillation) (HCC)   Hypertension   CKD (chronic kidney disease) stage 3, GFR 30-59 ml/min (HCC)   Sepsis (HCC)   Healthcare-associated pneumonia   Influenza A   Goals of care, counseling/discussion   Palliative care by specialist   Terminal care   History of present illness:  Chief Complaint: SOB   HPI: Dominique Perez is a 84 y.o. female with medical history significant of A fib, CAD S/P PCI 2007, HTN, uterine cancer, who presents with SOB, in a fib and respiratory distress. Most history was obtain from family who is at bedside. Patient develops a productive cough a week ago.  On Friday she was ill, felt sick. Today she was more SOB.  Patient is able to say few words, she is sleepy. She denies chest pain, report SOB, and cough./   ED Course: patient presents with A fib RVR, febrile tempeture 101, WBC 23, Cr 1.8, BNP 1028, Lactic acid 4.5, chest x ray: Right lower lobe airspace disease concerning for atelectasis versus pneumonia. Bilateral interstitial thickening concerning for underlying mild interstitial edema.  Hospital Course:   Brief Narrative: Dominique M Sparksis a 84 y.o.femalewith medical history significant ofA fib, CAD S/P PCI 2007, HTN, uterine cancer,who presents with SOB, in a fib and respiratory distress. Most history was obtain from family who is at bedside. Patient develops a productive cough a week ago. On Friday she was ill, felt sick. Today she was more SOB.  Patient is able to say few words, she is sleepy. She denies chest pain, report SOB, and cough.   Plan:- 1)Sepsis with acute Hypoxic Respiratory Failure secondary to Pneumonia and influenza A-   patient's condition deteriorated, she was made comfort measures, patient expired with family  at bedside. Time: Date of Death: 06-27-17 at 66 PM  2)Afib with RVR- patient's condition deteriorated, she was made comfort measures, patient expired with family at bedside. Time: Date of Death: June 27, 2017 at 72 PM  3)Aki on CKD III- patient's condition deteriorated, she was made comfort measures, patient expired with family at bedside. Time: Date of Death: 27-Jun-2017 at 3 PM  Dominique Perez  [facility] 06/27/17, 3:40 PM

## 2017-07-09 NOTE — Progress Notes (Signed)
Daily Progress Note   Patient Name: Dominique Perez       Date: 07-15-2017 DOB: 12-Oct-1926  Age: 84 y.o. MRN#: [id number] Attending Physician: Roxan Hockey, MD Primary Care Physician: Eulas Post, MD Admit Date: 06/12/2017  Reason for Consultation/Follow-up: Establishing goals of care and Terminal Care  Subjective: Patient actively dying. Joy at bedside. Noted oxygen cannula applied. Discussed with Joy that at this point O2 cannula has no role in providing comfort- comfort is better managed with opioids and benzodiazepines. Cannula removed with Joy's permission.   Review of Systems  Unable to perform ROS: Acuity of condition    Length of Stay: 5  Current Medications: Scheduled Meds:  . pantoprazole (PROTONIX) IV  40 mg Intravenous Q24H    Continuous Infusions: . morphine 2 mg/hr (06/17/17 1900)    PRN Meds: acetaminophen **OR** acetaminophen, glycopyrrolate **OR** glycopyrrolate **OR** glycopyrrolate, LORazepam, LORazepam **OR** [DISCONTINUED] LORazepam **OR** LORazepam, morphine, morphine CONCENTRATE  Physical Exam  Constitutional:  Cachetic, frail  Cardiovascular:  Distal pulses weak, bradycardic  Pulmonary/Chest:  Periods of apnea, shallow respirations  Musculoskeletal: She exhibits no edema.  Neurological:  Nonresponsive to noxious stimuli  Skin:  Extremities mottled and cool to touch  Nursing note and vitals reviewed.           Vital Signs: BP (!) 52/31   Pulse (!) 43   Temp (!) 96.1 F (35.6 C) (Axillary)   Resp 12   Ht 5\' 3"  (1.6 m)   Wt 58.9 kg (129 lb 13.6 oz)   SpO2 99%   BMI 23.00 kg/m  SpO2: SpO2: 99 % O2 Device: O2 Device: Nasal Cannula O2 Flow Rate: O2 Flow Rate (L/min): 2 L/min  Intake/output summary:   Intake/Output Summary (Last 24  hours) at Jul 15, 2017 1317 Last data filed at 2017/07/15 0800 Gross per 24 hour  Intake 82 ml  Output -  Net 82 ml   LBM: Last BM Date: 06/14/17 Baseline Weight: Weight: 59 kg (130 lb) Most recent weight: Weight: 58.9 kg (129 lb 13.6 oz)       Palliative Assessment/Data: PPS: 10%   Flowsheet Rows     Most Recent Value  Intake Tab  Referral Department  Hospitalist  Unit at Time of Referral  Med/Surg Unit  Palliative Care Primary Diagnosis  Nephrology  Date Notified  06/15/17  Palliative Care Type  New Palliative care  Reason for referral  Clarify Goals of Care  Date of Admission  06/19/2017  Date first seen by Palliative Care  06/15/17  # of days Palliative referral response time  0 Day(s)  # of days IP prior to Palliative referral  2  Clinical Assessment  Psychosocial & Spiritual Assessment  Palliative Care Outcomes      Patient Active Problem List   Diagnosis Date Noted  . Terminal care   . Goals of care, counseling/discussion   . Palliative care by specialist   . Influenza A   . Sepsis ([place]) 07/03/2017  . Healthcare-associated pneumonia 07/08/2017  . Dementia 02/08/2017  . Memory loss 03/23/2016  . Atrial fibrillation with rapid ventricular response ([place]) 12/17/2015  . Atrial fibrillation with RVR ([place]) 12/17/2015  . Atrial flutter ([place]) 04/13/2014  . Chronic anticoagulation 03/27/2014  . Osteoarthritis of multiple joints 09/04/2011  . CKD (chronic kidney disease) stage 3, GFR 30-59 ml/min (HCC) 09/04/2011  . Bradycardia 02/17/2011  . PAF (paroxysmal atrial fibrillation) ([place])   . Hypertension   . Leg pain, right   . Arthritis   . GERD (gastroesophageal reflux disease)   . Hyperlipidemia   . Gallstones   . ASTEATOTIC ECZEMA 06/18/2009  . CAD S/P percutaneous coronary angioplasty 12/20/2008  . ASTHMA 12/20/2008  . GERD 12/20/2008  . DECUBITUS ULCER, BUTTOCK 12/20/2008  . COUGH, CHRONIC 12/20/2008    Palliative Care Assessment & Plan   Patient  Profile: 84 y.o.femalewith past medical history of dementia, A. Fib, CAD, HTN, uterine CA, presents w/ cough congestion x 1 week, worsening SOB, workup reveals interstitial edema, sepsis, influenza, pneumonia, respiratory failuire requiring bipap, admitted on2/3/2019for management. During admission WBC has continued to increase despite IV fluids, and antibiotics, she has continued to require intermittent bipap. Kidney function remains stable but has not improved. Per discussion with attending team patient's daughter has requested DNR, DNI, no pressers. SLP has recommended NPO status. She has been started on prn morphine for some comfort. Palliative medicine consulted for ongoing [place] as patient showing no signs of improvement. Patient was transitioned to comfort care on 2/7.  Assessment/Recommendations/Plan   Actively dying  Removed O2 cannula as naturally dying process is to become hypoxic- recommend managing symptoms of SOB or air hunger with morphine and ativan as ordered. Do not restart oxygen or titrate oxgen up.   Do not expect patient to survive the day.  Goals of Care and Additional Recommendations:  Limitations on Scope of Treatment: Full Comfort Care  Code Status:  DNR  Prognosis:   Hours - Days  Discharge Planning:  Anticipated Hospital Death  Care plan was discussed with patient's daughter, Caryl Asp and patient's nurse- Minette Headland.   Thank you for allowing the Palliative Medicine Team to assist in the care of this patient.   Time In: 1245 Time Out: 1310 Total Time 25 mins Prolonged Time Billed no      Greater than 50%  of this time was spent counseling and coordinating care related to the above assessment and plan.  Mariana Kaufman, AGNP-C Palliative Medicine   Please contact Palliative Medicine Team phone at [phone] for questions and concerns.

## 2017-07-09 NOTE — Progress Notes (Signed)
We initiatally visit to her and family. And, they asked to pray for mom. Also we asked some thing special request. And, family asked to pray for peace of her last stage of life. We did pray for her.    07/16/2017 0900  Clinical Encounter Type  Visited With Patient and family together  Visit Type Initial;Spiritual support;Critical Care  Referral From Nurse  Consult/Referral To Chaplain  Spiritual Encounters  Spiritual Needs Prayer;Emotional  Stress Factors  Patient Stress Factors None identified  Family Stress Factors Loss    Conard Novak, Chaplain

## 2017-07-09 NOTE — Progress Notes (Signed)
Patient remains peaceful.  Morphine continues to infuse at 2 mg/hr.  Respirations remain shallow.  Daughter Caryl Asp called in for an update.  Skin remains cool to the touch.  Will continue to monitor patient.

## 2017-07-09 DEATH — deceased
